# Patient Record
Sex: Female | Born: 1965 | Race: White | Hispanic: No | Marital: Married | Smoking: Never smoker
Health system: Southern US, Community
[De-identification: ages and names within clinical notes are randomized; demographics above are authoritative.]

## PROBLEM LIST (undated history)

## (undated) DIAGNOSIS — N390 Urinary tract infection, site not specified: Secondary | ICD-10-CM

## (undated) DIAGNOSIS — K649 Unspecified hemorrhoids: Secondary | ICD-10-CM

## (undated) DIAGNOSIS — N904 Leukoplakia of vulva: Secondary | ICD-10-CM

## (undated) DIAGNOSIS — U071 COVID-19: Secondary | ICD-10-CM

## (undated) DIAGNOSIS — R011 Cardiac murmur, unspecified: Secondary | ICD-10-CM

## (undated) DIAGNOSIS — T7840XA Allergy, unspecified, initial encounter: Secondary | ICD-10-CM

## (undated) DIAGNOSIS — D259 Leiomyoma of uterus, unspecified: Secondary | ICD-10-CM

## (undated) DIAGNOSIS — C9210 Chronic myeloid leukemia, BCR/ABL-positive, not having achieved remission: Secondary | ICD-10-CM

## (undated) DIAGNOSIS — D219 Benign neoplasm of connective and other soft tissue, unspecified: Secondary | ICD-10-CM

## (undated) DIAGNOSIS — E079 Disorder of thyroid, unspecified: Secondary | ICD-10-CM

## (undated) HISTORY — DX: Disorder of thyroid, unspecified: E07.9

## (undated) HISTORY — DX: Allergy, unspecified, initial encounter: T78.40XA

## (undated) HISTORY — DX: Urinary tract infection, site not specified: N39.0

## (undated) HISTORY — DX: COVID-19: U07.1

## (undated) HISTORY — PX: TYMPANOSTOMY TUBE PLACEMENT: SHX32

## (undated) HISTORY — DX: Benign neoplasm of connective and other soft tissue, unspecified: D21.9

## (undated) HISTORY — DX: Unspecified hemorrhoids: K64.9

## (undated) HISTORY — DX: Leukoplakia of vulva: N90.4

## (undated) HISTORY — DX: Cardiac murmur, unspecified: R01.1

## (undated) HISTORY — PX: CYSTECTOMY: SUR359

## (undated) HISTORY — DX: Chronic myeloid leukemia, BCR/ABL-positive, not having achieved remission: C92.10

## (undated) HISTORY — DX: Leiomyoma of uterus, unspecified: D25.9

## (undated) HISTORY — PX: TONSILECTOMY, ADENOIDECTOMY, BILATERAL MYRINGOTOMY AND TUBES: SHX2538

## (undated) HISTORY — PX: OTHER SURGICAL HISTORY: SHX169

---

## 2004-06-13 ENCOUNTER — Encounter: Payer: Self-pay | Admitting: Cardiovascular Disease

## 2004-08-28 ENCOUNTER — Encounter: Payer: Self-pay | Admitting: Cardiovascular Disease

## 2005-06-26 ENCOUNTER — Encounter: Payer: Self-pay | Admitting: Cardiovascular Disease

## 2005-10-11 ENCOUNTER — Encounter: Payer: Self-pay | Admitting: Cardiovascular Disease

## 2005-10-23 ENCOUNTER — Encounter: Payer: Self-pay | Admitting: Cardiovascular Disease

## 2006-08-06 ENCOUNTER — Encounter: Payer: Self-pay | Admitting: Cardiovascular Disease

## 2006-09-05 ENCOUNTER — Encounter: Payer: Self-pay | Admitting: Cardiovascular Disease

## 2006-09-09 ENCOUNTER — Encounter: Payer: Self-pay | Admitting: Cardiovascular Disease

## 2007-02-07 LAB — HM MAMMOGRAPHY

## 2007-06-08 ENCOUNTER — Encounter: Payer: Self-pay | Admitting: Cardiovascular Disease

## 2007-06-09 ENCOUNTER — Encounter: Payer: Self-pay | Admitting: Cardiovascular Disease

## 2007-06-22 ENCOUNTER — Encounter: Payer: Self-pay | Admitting: Cardiovascular Disease

## 2008-01-06 ENCOUNTER — Encounter: Payer: Self-pay | Admitting: Cardiovascular Disease

## 2008-02-17 ENCOUNTER — Encounter: Payer: Self-pay | Admitting: Cardiovascular Disease

## 2011-05-09 LAB — HM PAP SMEAR: HM PAP: NEGATIVE

## 2017-02-19 ENCOUNTER — Inpatient Hospital Stay: Payer: BLUE CROSS/BLUE SHIELD | Attending: Oncology | Admitting: Oncology

## 2017-02-19 ENCOUNTER — Encounter: Payer: Self-pay | Admitting: Oncology

## 2017-02-19 ENCOUNTER — Encounter (INDEPENDENT_AMBULATORY_CARE_PROVIDER_SITE_OTHER): Payer: Self-pay

## 2017-02-19 ENCOUNTER — Inpatient Hospital Stay: Payer: BLUE CROSS/BLUE SHIELD

## 2017-02-19 ENCOUNTER — Other Ambulatory Visit: Payer: Self-pay

## 2017-02-19 VITALS — BP 112/74 | HR 74 | Temp 96.6°F | Resp 16 | Ht 63.75 in | Wt 179.6 lb

## 2017-02-19 DIAGNOSIS — E039 Hypothyroidism, unspecified: Secondary | ICD-10-CM | POA: Diagnosis not present

## 2017-02-19 DIAGNOSIS — C9210 Chronic myeloid leukemia, BCR/ABL-positive, not having achieved remission: Secondary | ICD-10-CM

## 2017-02-19 DIAGNOSIS — Z880 Allergy status to penicillin: Secondary | ICD-10-CM | POA: Diagnosis not present

## 2017-02-19 DIAGNOSIS — R946 Abnormal results of thyroid function studies: Secondary | ICD-10-CM | POA: Insufficient documentation

## 2017-02-19 DIAGNOSIS — R300 Dysuria: Secondary | ICD-10-CM | POA: Diagnosis not present

## 2017-02-19 DIAGNOSIS — D500 Iron deficiency anemia secondary to blood loss (chronic): Secondary | ICD-10-CM

## 2017-02-19 DIAGNOSIS — N920 Excessive and frequent menstruation with regular cycle: Secondary | ICD-10-CM | POA: Diagnosis not present

## 2017-02-19 DIAGNOSIS — Z808 Family history of malignant neoplasm of other organs or systems: Secondary | ICD-10-CM

## 2017-02-19 DIAGNOSIS — C9211 Chronic myeloid leukemia, BCR/ABL-positive, in remission: Secondary | ICD-10-CM | POA: Insufficient documentation

## 2017-02-19 DIAGNOSIS — Z8052 Family history of malignant neoplasm of bladder: Secondary | ICD-10-CM | POA: Diagnosis not present

## 2017-02-19 DIAGNOSIS — D259 Leiomyoma of uterus, unspecified: Secondary | ICD-10-CM

## 2017-02-19 DIAGNOSIS — Z79899 Other long term (current) drug therapy: Secondary | ICD-10-CM | POA: Insufficient documentation

## 2017-02-19 LAB — URINALYSIS, COMPLETE (UACMP) WITH MICROSCOPIC
Bilirubin Urine: NEGATIVE
GLUCOSE, UA: NEGATIVE mg/dL
HGB URINE DIPSTICK: NEGATIVE
KETONES UR: NEGATIVE mg/dL
LEUKOCYTES UA: NEGATIVE
NITRITE: NEGATIVE
PH: 7 (ref 5.0–8.0)
Protein, ur: NEGATIVE mg/dL
Specific Gravity, Urine: 1.01 (ref 1.005–1.030)

## 2017-02-19 LAB — COMPREHENSIVE METABOLIC PANEL
ALT: 16 U/L (ref 14–54)
AST: 19 U/L (ref 15–41)
Albumin: 3.9 g/dL (ref 3.5–5.0)
Alkaline Phosphatase: 61 U/L (ref 38–126)
Anion gap: 6 (ref 5–15)
BUN: 12 mg/dL (ref 6–20)
CHLORIDE: 102 mmol/L (ref 101–111)
CO2: 27 mmol/L (ref 22–32)
CREATININE: 0.69 mg/dL (ref 0.44–1.00)
Calcium: 8.8 mg/dL — ABNORMAL LOW (ref 8.9–10.3)
GFR calc Af Amer: >60 mL/min (ref >60)
Glucose, Bld: 94 mg/dL (ref 65–99)
Potassium: 3.9 mmol/L (ref 3.5–5.1)
Sodium: 135 mmol/L (ref 135–145)
TOTAL PROTEIN: 7.8 g/dL (ref 6.5–8.1)
Total Bilirubin: 0.5 mg/dL (ref 0.3–1.2)

## 2017-02-19 LAB — CBC WITH DIFFERENTIAL/PLATELET
BASOS ABS: 0.1 10*3/uL (ref 0–0.1)
BASOS PCT: 1 %
EOS ABS: 0.2 10*3/uL (ref 0–0.7)
EOS PCT: 5 %
HCT: 35.3 % (ref 35.0–47.0)
HEMOGLOBIN: 11.9 g/dL — AB (ref 12.0–16.0)
Lymphocytes Relative: 32 %
Lymphs Abs: 1.3 10*3/uL (ref 1.0–3.6)
MCH: 29.8 pg (ref 26.0–34.0)
MCHC: 33.6 g/dL (ref 32.0–36.0)
MCV: 88.9 fL (ref 80.0–100.0)
Monocytes Absolute: 0.3 10*3/uL (ref 0.2–0.9)
Monocytes Relative: 8 %
Neutro Abs: 2.3 10*3/uL (ref 1.4–6.5)
Neutrophils Relative %: 54 %
PLATELETS: 229 10*3/uL (ref 150–440)
RBC: 3.97 MIL/uL (ref 3.80–5.20)
RDW: 15.9 % — ABNORMAL HIGH (ref 11.5–14.5)
WBC: 4.2 10*3/uL (ref 3.6–11.0)

## 2017-02-19 LAB — TSH: TSH: 4.665 u[IU]/mL — AB (ref 0.350–4.500)

## 2017-02-19 NOTE — Progress Notes (Signed)
Hematology/Oncology Consult note [facility] Telephone:([phone] Fax:([phone]   Patient Care Team: Patient, No Pcp Per as PCP - General (General Practice)  REFERRING PROVIDER: Previous oncologist from [place] Dr.Hasan Rizvi CHIEF COMPLAINTS/PURPOSE OF CONSULTATION:  Management of CML  HISTORY OF PRESENTING ILLNESS:  Jennifer English is a  51 y.o.  female with PMH listed below who was referred to by her oncologist in [place]. Patient relocated from [place] to [place] this summer. She has not had local pcp yet. Extensive medical records review was performed.  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 She had dyuria symptoms at that time and lab work up found extreme leukocytosis with WBC 173,500 and she was initially started on hydrea. After CML was confirmed, she was started on Tasigna 300mg  BID since then and has achieved remission. . Also had splenomegaly at presentation about 5-6 cm below LCM, resolved. . She reports being complaint on nilotinib however, she has had period of time when her medication runs out. Although she moved this summer, due to lot of stresses associated with relocation and new job, she waited until now see hematologist.  Denies any fever, chill, weight loss, bleeding events. She used to have EKG done every 3 months and faxed to oncologist. She has not had any EKG done after July this year. No PCP Also reports recently feeling urine has odor and also very mild burning sensation when passing urine. She requests to have urine tested.  Has hypothyroidism, not taking thyroid supplements.  History of iron deficiency due to heavy menstrual periods/uterine fibroids, taking iron supplements.  # 10/09/2012, bone marrow aspirate smears and clots: Markedly hypercellular marrow with prominent myeloid hyperplasia and increasing small hypolobated megakaryocytes. The overall findings are consistent with chronic myeloid leukemia, chronic phase. #  03/22/2015 BCR ABL quantification PCR showed e13a2 (b2a2 p210) transcript <0.001%,e14a2 ( b3a2, p210)  transcript 0.023%, e1a2 (p190) transcript <0.001% # 08/17/2015 BCR ABL quantification PCR showed e13a2 (b2a2 p210) transcript 0.011%,e14a2 ( b3a2, p210)  transcript <0.001%, e1a2 (p190) transcript <0.001%  # 04/25/2016 BCR ABL quantification PCR showed e13a2 (b2a2 p210) transcript <0.001%,e14a2 ( b3a2, p210)  transcript <0.001%, e1a2 (p190) transcript <0.001%  #09/17/2016  BCR ABL quantification PCR showed e13a2 (b2a2 p210) transcript <0.0032%,e14a2 ( b3a2, p210)  transcript <0.0032%, e1a2 (p190) transcript <0.0032%   Review of Systems  Constitutional: Negative for chills and fever.  HENT: Negative for hearing loss and tinnitus.   Eyes: Negative for blurred vision.  Respiratory: Negative for cough.   Cardiovascular: Negative for chest pain.  Gastrointestinal: Negative for heartburn.  Genitourinary: Positive for dysuria.  Musculoskeletal: Negative for myalgias.  Skin: Negative for rash.  Neurological: Negative for dizziness.  Endo/Heme/Allergies: Does not bruise/bleed easily.  Psychiatric/Behavioral: Negative for depression.    MEDICAL HISTORY:  Past Medical History:  Diagnosis Date  . Allergy   . Leukemia ([place])   . Thyroid disease   . Uterine fibroid     SURGICAL HISTORY: Past Surgical History:  Procedure Laterality Date  . CESAREAN SECTION    . CYSTECTOMY     middle finger right hand   . TONSILECTOMY, ADENOIDECTOMY, BILATERAL MYRINGOTOMY AND TUBES      SOCIAL HISTORY: Social History   Socioeconomic History  . Marital status: Married    Spouse name: Not on file  . Number of children: Not on file  . Years of education: Not on file  . Highest education level: Not on file  Social Needs  . Financial  resource strain: Not on file  . Food insecurity - worry: Not on file  . Food insecurity - inability: Not on file  . Transportation needs - medical: Not on file  .  Transportation needs - non-medical: Not on file  Occupational History  . Not on file  Tobacco Use  . Smoking status: Never Smoker  . Smokeless tobacco: Never Used  Substance and Sexual Activity  . Alcohol use: No    Frequency: Never  . Drug use: No  . Sexual activity: Yes    Birth control/protection: None  Other Topics Concern  . Not on file  Social History Narrative  . Not on file    FAMILY HISTORY: Family History  Problem Relation Age of Onset  . Dementia Maternal Grandfather   . Melanoma Paternal Grandmother   . Bladder Cancer Paternal Grandmother   . Bladder Cancer Paternal Grandfather     ALLERGIES:  is allergic to aspirin; avocado; eggs or egg-derived products; and penicillins.  MEDICATIONS:  Current Outpatient Medications  Medication Sig Dispense Refill  . Ascorbic Acid (VITAMIN C) 100 MG tablet Take 100 mg by mouth daily.    . ferrous sulfate 325 (65 FE) MG tablet Take 325 mg by mouth daily with breakfast.    . magnesium citrate SOLN Take 1 Bottle by mouth once.    Marland Kitchen TASIGNA 150 MG capsule Take 150 mg by mouth 2 (two) times daily.     No current facility-administered medications for this visit.      PHYSICAL EXAMINATION: ECOG PERFORMANCE STATUS: 0 - Asymptomatic Vitals:   02/19/17 1019  BP: 112/74  Pulse: 74  Resp: 16  Temp: (!) 96.6 F (35.9 C)   Filed Weights   02/19/17 1019  Weight: 179 lb 9 oz (81.4 kg)    GENERAL: No distress, well nourished.  SKIN:  No rashes or significant lesions  HEAD: Normocephalic, No masses, lesions, tenderness or abnormalities  EYES: Conjunctiva are pink, non icteric ENT: External ears normal ,lips , buccal mucosa, and tongue normal and mucous membranes are moist  LYMPH: No palpable cervical and axillary lymphadenopathy  LUNGS: Clear to auscultation, no crackles or wheezes HEART: Regular rate & rhythm, no murmurs, no gallops, S1 normal and S2 normal  ABDOMEN: Abdomen soft, non-tender, normal bowel sounds, I did not  appreciate any  masses or organomegaly  MUSCULOSKELETAL: No CVA tenderness and no tenderness on percussion of the back or rib cage.  EXTREMITIES: No edema, no skin discoloration or tenderness NEURO: Alert & oriented, no focal motor/sensory deficits.    LABORATORY DATA:  I have reviewed the data as listed Lab Results  Component Value Date   WBC 4.2 02/19/2017   HGB 11.9 (L) 02/19/2017   HCT 35.3 02/19/2017   MCV 88.9 02/19/2017   PLT 229 02/19/2017   Recent Labs    02/19/17 1123  NA 135  K 3.9  CL 102  CO2 27  GLUCOSE 94  BUN 12  CREATININE 0.69  CALCIUM 8.8*  GFRNONAA >60  GFRAA >60  PROT 7.8  ALBUMIN 3.9  AST 19  ALT 16  ALKPHOS 61  BILITOT 0.5       ASSESSMENT & PLAN:  1. CML (chronic myelocytic leukemia) ([place])   2. Dysuria   3. Hypothyroidism, unspecified type    Check BCR-ABL1 Quantification PCR. Continue Tasigna 300mg  BID Refer to primary care to establish care and check EKGs every 3 months.. Check UA, TSH, cbc, cmp. UA showed negative nitrate and leukocytes. TSH mildly  elevated. Results were communicated with patient and she has appointment in early December to establish care for her chronic medical issue. ,   All questions were answered. The patient knows to call the clinic with any problems questions or concerns. Return of visit: 4 weeks.     [name], MD, PhD Hematology Oncology [facility] at [facility] Pager- [phone] 02/19/2017

## 2017-02-19 NOTE — Progress Notes (Signed)
Patient here today as a new patient with leukemia.  Patient c/o intermittent urine odor and lower abdominal discomfort

## 2017-02-20 ENCOUNTER — Encounter: Payer: Self-pay | Admitting: Oncology

## 2017-02-20 ENCOUNTER — Telehealth: Payer: Self-pay | Admitting: Pharmacist

## 2017-02-20 DIAGNOSIS — E039 Hypothyroidism, unspecified: Secondary | ICD-10-CM | POA: Insufficient documentation

## 2017-02-20 DIAGNOSIS — C9210 Chronic myeloid leukemia, BCR/ABL-positive, not having achieved remission: Secondary | ICD-10-CM | POA: Insufficient documentation

## 2017-02-20 MED ORDER — TASIGNA 150 MG PO CAPS: 300.0000 mg | ORAL_CAPSULE | Freq: Two times a day (BID) | ORAL | 2 refills | Status: DC

## 2017-02-20 MED ORDER — NILOTINIB HCL 150 MG PO CAPS: 300.0000 mg | ORAL_CAPSULE | Freq: Two times a day (BID) | ORAL | 2 refills | Status: DC

## 2017-02-20 NOTE — Progress Notes (Signed)
START OFF PATHWAY REGIMEN - CML   [id number]:Nilotinib 300 mg BID:   Administer twice daily.:     Nilotinib   **Always confirm dose/schedule in your pharmacy ordering system**    Patient Characteristics: BCR-ABL Negative BCR-ABL Status: Negative Intent of Therapy: Curative Intent, Discussed with Patient

## 2017-02-20 NOTE — Addendum Note (Signed)
Addended by: [name] on: 02/20/2017 10:17 AM   Modules accepted: Orders

## 2017-02-20 NOTE — Telephone Encounter (Signed)
Oral Chemotherapy Pharmacist Encounter  Due to insurance restriction, medication can not be filled at [facility]. Must be filled at [facility]. E-scribing Rx to them.   [name], PharmD, BCPS Hematology/Oncology Clinical Pharmacist [facility] [phone]  02/20/2017 5:37 PM

## 2017-02-20 NOTE — Telephone Encounter (Signed)
Oral Oncology Pharmacist Encounter  Received new prescription for Tasigna (nilotinib) for the treatment of CML, planned duration until disease progression or unacceptable drug toxicity. She has been on Tasigna since 09/2012. She has recently moved and transferring her care to [facility].   CMP/CBC from 02/19/17 assessed, no relevant lab abnormalities. Prescription dose and frequency assessed.   Current medication list in Epic reviewed, no DDIs with Tasigna identified.   Prescription has been e-scribed to the [facility] for benefits analysis and approval.  Oral Oncology Clinic will continue to follow for insurance authorization, copayment issues, initial counseling and start date.  [name], PharmD, BCPS Hematology/Oncology Clinical Pharmacist [facility] [phone]  02/20/2017 3:53 PM

## 2017-02-21 NOTE — Telephone Encounter (Signed)
Oral Chemotherapy Pharmacist Encounter  Called patient to inform her that her prescription was sent to [facility]. She has used them in the past.  With her new insurance she is aware that we will most likely need to complete a PA before the medication can be filled.  [name], PharmD, BCPS Hematology/Oncology Clinical Pharmacist [facility] [phone]  02/21/2017 9:05 AM

## 2017-02-24 ENCOUNTER — Ambulatory Visit: Payer: BLUE CROSS/BLUE SHIELD | Admitting: Internal Medicine

## 2017-02-24 LAB — BCR-ABL1, CML/ALL, PCR, QUANT

## 2017-02-26 ENCOUNTER — Telehealth: Payer: Self-pay | Admitting: Internal Medicine

## 2017-02-26 NOTE — Telephone Encounter (Signed)
Were you able to speak to her and get this taken care of today?

## 2017-02-26 NOTE — Telephone Encounter (Signed)
Oral Oncology Patient Advocate Encounter  Corky Sing been trying to get in touch with patient to have her call [facility] to get her Tasigna shipped. Have called fro several days and left messages for her to please call me.    [place] Patient Advocate [phone] 02/26/2017 9:22 AM

## 2017-02-27 ENCOUNTER — Telehealth: Payer: Self-pay | Admitting: Pharmacist

## 2017-02-27 NOTE — Telephone Encounter (Signed)
Oral Chemotherapy Pharmacist Encounter  Ms. Ezzell did not show up to her ECG. This is needed to monitor her while she is on Tasigna. Called [facility] to cancel further refills her her Tasigna.  If she does complete her ECG, she will need a new prescription sent into [facility] at that time.   [name], PharmD, BCPS Hematology/Oncology Clinical Pharmacist [facility] [phone]  02/27/2017 4:25 PM

## 2017-02-27 NOTE — Telephone Encounter (Signed)
Oral Oncology Patient Advocate Encounter  Called [facility] and they are shipping her Tasigna today.   [facility] Patient Advocate [phone] 02/27/2017 11:31 AM

## 2017-03-07 NOTE — Telephone Encounter (Signed)
Her chart shows that she has been rescheduled for 04/03/17.

## 2017-03-07 NOTE — Telephone Encounter (Signed)
Jennifer English, patient did not show up at PCP, do you know if she was rescheduled?  thanks

## 2017-03-18 NOTE — Progress Notes (Deleted)
Hematology/Oncology Consult note [facility] [phone] Fax:(336) [phone]   Patient Care Team: McLean-Scocuzza, Nino Glow, MD as PCP - General (Internal Medicine)  REFERRING PROVIDER: Previous oncologist from [place] Dr.Hasan Rizvi CHIEF COMPLAINTS/PURPOSE OF CONSULTATION:  Management of CML  HISTORY OF PRESENTING ILLNESS:  Jennifer English is a  51 y.o.  female with PMH listed below who was referred to by her oncologist in [place]. Patient relocated from [place] to [place] this summer. She has not had local pcp yet. Extensive medical records review was performed.  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 She had dyuria symptoms at that time and lab work up found extreme leukocytosis with WBC 173,500 and she was initially started on hydrea. After CML was confirmed, she was started on Tasigna 300mg  BID since then and has achieved remission. . Also had splenomegaly at presentation about 5-6 cm below LCM, resolved. . She reports being complaint on nilotinib however, she has had period of time when her medication runs out. Although she moved this summer, due to lot of stresses associated with relocation and new job, she waited until now see hematologist.  Denies any fever, chill, weight loss, bleeding events. She used to have EKG done every 3 months and faxed to oncologist. She has not had any EKG done after July this year. No PCP Also reports recently feeling urine has odor and also very mild burning sensation when passing urine. She requests to have urine tested.  Has hypothyroidism, not taking thyroid supplements.  History of iron deficiency due to heavy menstrual periods/uterine fibroids, taking iron supplements.  # 10/09/2012, bone marrow aspirate smears and clots: Markedly hypercellular marrow with prominent myeloid hyperplasia and increasing small hypolobated megakaryocytes. The overall findings are consistent with chronic myeloid leukemia, chronic phase. #  03/22/2015 BCR ABL quantification PCR showed e13a2 (b2a2 p210) transcript <0.001%,e14a2 ( b3a2, p210)  transcript 0.023%, e1a2 (p190) transcript <0.001% # 08/17/2015 BCR ABL quantification PCR showed e13a2 (b2a2 p210) transcript 0.011%,e14a2 ( b3a2, p210)  transcript <0.001%, e1a2 (p190) transcript <0.001%  # 04/25/2016 BCR ABL quantification PCR showed e13a2 (b2a2 p210) transcript <0.001%,e14a2 ( b3a2, p210)  transcript <0.001%, e1a2 (p190) transcript <0.001%  #09/17/2016  BCR ABL quantification PCR showed e13a2 (b2a2 p210) transcript <0.0032%,e14a2 ( b3a2, p210)  transcript <0.0032%, e1a2 (p190) transcript <0.0032%   Review of Systems  Constitutional: Negative for chills and fever.  HENT: Negative for hearing loss and tinnitus.   Eyes: Negative for blurred vision.  Respiratory: Negative for cough.   Cardiovascular: Negative for chest pain.  Gastrointestinal: Negative for heartburn.  Genitourinary: Positive for dysuria.  Musculoskeletal: Negative for myalgias.  Skin: Negative for rash.  Neurological: Negative for dizziness.  Endo/Heme/Allergies: Does not bruise/bleed easily.  Psychiatric/Behavioral: Negative for depression.    MEDICAL HISTORY:  Past Medical History:  Diagnosis Date  . Allergy   . Uterine fibroid     SURGICAL HISTORY: Past Surgical History:  Procedure Laterality Date  . CESAREAN SECTION    . CYSTECTOMY     middle finger right hand   . TONSILECTOMY, ADENOIDECTOMY, BILATERAL MYRINGOTOMY AND TUBES      SOCIAL HISTORY: Social History   Socioeconomic History  . Marital status: Married    Spouse name: Not on file  . Number of children: Not on file  . Years of education: Not on file  . Highest education level: Not on file  Social Needs  . Financial resource strain: Not on file  . Food insecurity -  worry: Not on file  . Food insecurity - inability: Not on file  . Transportation needs - medical: Not on file  . Transportation needs - non-medical: Not on file    Occupational History  . Not on file  Tobacco Use  . Smoking status: Never Smoker  . Smokeless tobacco: Never Used  Substance and Sexual Activity  . Alcohol use: No    Frequency: Never  . Drug use: No  . Sexual activity: Yes    Birth control/protection: None  Other Topics Concern  . Not on file  Social History Narrative  . Not on file    FAMILY HISTORY: Family History  Problem Relation Age of Onset  . Dementia Maternal Grandfather   . Melanoma Paternal Grandmother   . Bladder Cancer Paternal Grandmother   . Bladder Cancer Paternal Grandfather     ALLERGIES:  is allergic to aspirin; avocado; eggs or egg-derived products; and penicillins.  MEDICATIONS:  Current Outpatient Medications  Medication Sig Dispense Refill  . Ascorbic Acid (VITAMIN C) 100 MG tablet Take 100 mg by mouth daily.    . ferrous sulfate 325 (65 FE) MG tablet Take 325 mg by mouth daily with breakfast.    . magnesium citrate SOLN Take 1 Bottle by mouth once.    . nilotinib (TASIGNA) 150 MG capsule Take 2 capsules (300 mg total) by mouth 2 (two) times daily. Take on an empty stomach, 1 hour before or 2 hours after food. 120 capsule 2   No current facility-administered medications for this visit.      PHYSICAL EXAMINATION: ECOG PERFORMANCE STATUS: 0 - Asymptomatic There were no vitals filed for this visit. There were no vitals filed for this visit.  GENERAL: No distress, well nourished.  SKIN:  No rashes or significant lesions  HEAD: Normocephalic, No masses, lesions, tenderness or abnormalities  EYES: Conjunctiva are pink, non icteric ENT: External ears normal ,lips , buccal mucosa, and tongue normal and mucous membranes are moist  LYMPH: No palpable cervical and axillary lymphadenopathy  LUNGS: Clear to auscultation, no crackles or wheezes HEART: Regular rate & rhythm, no murmurs, no gallops, S1 normal and S2 normal  ABDOMEN: Abdomen soft, non-tender, normal bowel sounds, I did not appreciate any   masses or organomegaly  MUSCULOSKELETAL: No CVA tenderness and no tenderness on percussion of the back or rib cage.  EXTREMITIES: No edema, no skin discoloration or tenderness NEURO: Alert & oriented, no focal motor/sensory deficits.    LABORATORY DATA:  I have reviewed the data as listed Lab Results  Component Value Date   WBC 4.2 02/19/2017   HGB 11.9 (L) 02/19/2017   HCT 35.3 02/19/2017   MCV 88.9 02/19/2017   PLT 229 02/19/2017   Recent Labs    02/19/17 1123  NA 135  K 3.9  CL 102  CO2 27  GLUCOSE 94  BUN 12  CREATININE 0.69  CALCIUM 8.8*  GFRNONAA >60  GFRAA >60  PROT 7.8  ALBUMIN 3.9  AST 19  ALT 16  ALKPHOS 61  BILITOT 0.5       ASSESSMENT & PLAN:  No diagnosis found. Check BCR-ABL1 Quantification PCR. Continue Tasigna 300mg  BID Refer to primary care to establish care and check EKGs every 3 months.. Check UA, TSH, cbc, cmp. UA showed negative nitrate and leukocytes. TSH mildly elevated. Results were communicated with patient and she has appointment in early December to establish care for her chronic medical issue. ,   All questions were answered. The patient  knows to call the clinic with any problems questions or concerns. Return of visit: 4 weeks.     [name], MD, PhD Hematology Oncology [facility] at [facility] Pager- [phone] 03/18/2017

## 2017-03-19 ENCOUNTER — Inpatient Hospital Stay: Payer: BLUE CROSS/BLUE SHIELD | Admitting: Oncology

## 2017-03-19 ENCOUNTER — Inpatient Hospital Stay: Payer: BLUE CROSS/BLUE SHIELD | Attending: Oncology

## 2017-04-03 ENCOUNTER — Encounter: Payer: Self-pay | Admitting: Internal Medicine

## 2017-04-03 ENCOUNTER — Ambulatory Visit: Payer: BLUE CROSS/BLUE SHIELD | Admitting: Internal Medicine

## 2017-04-03 VITALS — BP 118/72 | HR 74 | Temp 98.1°F | Resp 16 | Ht 64.0 in | Wt 181.5 lb

## 2017-04-03 DIAGNOSIS — Z5181 Encounter for therapeutic drug level monitoring: Secondary | ICD-10-CM | POA: Diagnosis not present

## 2017-04-03 DIAGNOSIS — E039 Hypothyroidism, unspecified: Secondary | ICD-10-CM | POA: Diagnosis not present

## 2017-04-03 DIAGNOSIS — R5383 Other fatigue: Secondary | ICD-10-CM

## 2017-04-03 DIAGNOSIS — J069 Acute upper respiratory infection, unspecified: Secondary | ICD-10-CM | POA: Diagnosis not present

## 2017-04-03 DIAGNOSIS — R011 Cardiac murmur, unspecified: Secondary | ICD-10-CM | POA: Diagnosis not present

## 2017-04-03 DIAGNOSIS — C9210 Chronic myeloid leukemia, BCR/ABL-positive, not having achieved remission: Secondary | ICD-10-CM

## 2017-04-03 DIAGNOSIS — R946 Abnormal results of thyroid function studies: Secondary | ICD-10-CM | POA: Diagnosis not present

## 2017-04-03 DIAGNOSIS — H547 Unspecified visual loss: Secondary | ICD-10-CM | POA: Diagnosis not present

## 2017-04-03 LAB — T3, FREE: T3 FREE: 3 pg/mL (ref 2.3–4.2)

## 2017-04-03 LAB — T4, FREE: FREE T4: 0.78 ng/dL (ref 0.60–1.60)

## 2017-04-03 LAB — TSH: TSH: 5.43 u[IU]/mL — ABNORMAL HIGH (ref 0.35–4.50)

## 2017-04-03 NOTE — Patient Instructions (Signed)
Please follow up in 3 months sooner if cough does not resolve  Take care  I referred you to [facility] for eye exam    Cough, Adult Coughing is a reflex that clears your throat and your airways. Coughing helps to heal and protect your lungs. It is normal to cough occasionally, but a cough that happens with other symptoms or lasts a long time may be a sign of a condition that needs treatment. A cough may last only 2-3 weeks (acute), or it may last longer than 8 weeks (chronic). What are the causes? Coughing is commonly caused by:  Breathing in substances that irritate your lungs.  A viral or bacterial respiratory infection.  Allergies.  Asthma.  Postnasal drip.  Smoking.  Acid backing up from the stomach into the esophagus (gastroesophageal reflux).  Certain medicines.  Chronic lung problems, including COPD (or rarely, lung cancer).  Other medical conditions such as heart failure.  Follow these instructions at home: Pay attention to any changes in your symptoms. Take these actions to help with your discomfort:  Take medicines only as told by your health care provider. ? If you were prescribed an antibiotic medicine, take it as told by your health care provider. Do not stop taking the antibiotic even if you start to feel better. ? Talk with your health care provider before you take a cough suppressant medicine.  Drink enough fluid to keep your urine clear or pale yellow.  If the air is dry, use a cold steam vaporizer or humidifier in your bedroom or your home to help loosen secretions.  Avoid anything that causes you to cough at work or at home.  If your cough is worse at night, try sleeping in a semi-upright position.  Avoid cigarette smoke. If you smoke, quit smoking. If you need help quitting, ask your health care provider.  Avoid caffeine.  Avoid alcohol.  Rest as needed.  Contact a health care provider if:  You have new symptoms.  You cough up  pus.  Your cough does not get better after 2-3 weeks, or your cough gets worse.  You cannot control your cough with suppressant medicines and you are losing sleep.  You develop pain that is getting worse or pain that is not controlled with pain medicines.  You have a fever.  You have unexplained weight loss.  You have night sweats. Get help right away if:  You cough up blood.  You have difficulty breathing.  Your heartbeat is very fast. This information is not intended to replace advice given to you by your health care provider. Make sure you discuss any questions you have with your health care provider. Document Released: 09/07/2010 Document Revised: 08/17/2015 Document Reviewed: 05/18/2014 Elsevier Interactive Patient Education  [employer].

## 2017-04-06 ENCOUNTER — Encounter: Payer: Self-pay | Admitting: Internal Medicine

## 2017-04-06 DIAGNOSIS — R5383 Other fatigue: Secondary | ICD-10-CM | POA: Insufficient documentation

## 2017-04-06 NOTE — Progress Notes (Signed)
Chief Complaint  Patient presents with  . Establish Care   Establish  1. H/o CML moved from [place] follows with Dr. Miachel Roux in [place] on Tasigna 300 bid and needs EKG q3 months. She has been dx'ed since 09/2012 and on this medication since w/o side effects one side effect to caution is prolonged QT>60 msec and due to this she has to limit food 2 hours before medication and no food 1 hour after medication per pt.  She will f/u Dr. Miachel Roux as well.  2. She reports trouble seeing and prev seen eye MD in [place] and rec she wear readers. She reports trouble with focusing on objects and she has not seen eye MD since 2017 3. She c/o dry cough since 03/18/17 she has tried robitussin DM and getting better she is feeling ok and just c/w hoarseness with cough. She denies h/o asthma 4. 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 Negative for fever.  HENT: Negative for hearing loss.        +hoarseness   Eyes:       +trouble with focus   Respiratory: Positive for cough. Negative for shortness of breath.   Cardiovascular: Negative for chest pain.  Gastrointestinal: Negative for abdominal pain.  Musculoskeletal: Negative for myalgias.  Skin: Negative for rash.  Neurological: Negative for headaches.  Psychiatric/Behavioral: Negative for memory loss.   Past Medical History:  Diagnosis Date  . Allergy   . CML (chronic myelocytic leukemia) ([place])   . Heart murmur   . Uterine fibroid    Past Surgical History:  Procedure Laterality Date  . CESAREAN SECTION     x 1   . CYSTECTOMY     middle finger right hand   . OTHER SURGICAL HISTORY     cyst removed from finger  . TONSILECTOMY, ADENOIDECTOMY, BILATERAL MYRINGOTOMY AND TUBES     Family History  Problem Relation Age of Onset  . Heart disease Father        CAD with stents   . Hyperlipidemia Father   . Hypertension Father   . Dementia Maternal  Grandfather   . Melanoma Paternal Grandmother   . Bladder Cancer Paternal Grandmother   . Cancer Paternal Grandmother        lung/colon ?primary   . Bladder Cancer Paternal Grandfather   . Cancer Paternal Grandfather        bladder died in 46s   . Alcohol abuse Maternal Grandmother   . Hepatitis Maternal Grandmother    Social History   Socioeconomic History  . Marital status: Married    Spouse name: Not on file  . Number of children: Not on file  . Years of education: Not on file  . Highest education level: Not on file  Social Needs  . Financial resource strain: Not on file  . Food insecurity - worry: Not on file  . Food insecurity - inability: Not on file  . Transportation needs - medical: Not on file  . Transportation needs - non-medical: Not on file  Occupational History  . Not on file  Tobacco Use  . Smoking status: Never Smoker  . Smokeless tobacco: Never Used  Substance and Sexual Activity  . Alcohol use: No    Frequency: Never  . Drug use: No  . Sexual activity: Yes    Birth control/protection: None  Other Topics Concern  . Not  on file  Social History Narrative  . Not on file   Current Meds  Medication Sig  . Ascorbic Acid (VITAMIN C) 100 MG tablet Take 100 mg by mouth daily.  . ferrous sulfate 325 (65 FE) MG tablet Take 325 mg by mouth daily with breakfast.  . magnesium citrate SOLN Take 1 Bottle by mouth once.  . nilotinib (TASIGNA) 150 MG capsule Take 2 capsules (300 mg total) by mouth 2 (two) times daily. Take on an empty stomach, 1 hour before or 2 hours after food.   Allergies  Allergen Reactions  . Aspirin Rash  . Avocado Rash  . Eggs Or Egg-Derived Products Rash  . Penicillins Rash   Recent Results (from the past 2160 hour(s))  TSH     Status: Abnormal   Collection Time: 02/19/17 11:23 AM  Result Value Ref Range   TSH 4.665 (H) 0.350 - 4.500 uIU/mL    Comment: Performed by a 3rd Generation assay with a functional sensitivity of <=0.01 uIU/mL.   BCR-ABL1, CML/ALL, PCR, QUANT     Status: None   Collection Time: 02/19/17 11:23 AM  Result Value Ref Range   b2a2 transcript Comment %    Comment: (NOTE)           <0.0032 % (sensitivity limit of assay)    b3a2 transcript Comment %    Comment: (NOTE)           <0.0032 % (sensitivity limit of assay)    E1A2 Transcript Comment %    Comment: (NOTE)           <0.0032 % (sensitivity limit of assay)    Interpretation (BCRAL): Comment     Comment: (NOTE) NEGATIVE for the BCR-ABL1 e1a2 (p190), e13a2 (b2a2, p210) and e14a2 (b3a2, p210) fusion transcripts. These results do not rule out the presence of rare BCR-ABL1 transcripts not detected by this assay.    Director Review [facility]): Comment     Comment: (NOTE) Constance Goltz, PhD, [facility]               Director, [place] [facility] [place] [facility] [place], [place]    Background: Comment     Comment: (NOTE) This assay can detect three different types of BCR-ABL1 fusion transcripts associated with CML, ALL, and AML: e13a2 (previously b2a2) and e14a2 (previously b3a2) (major breakpoint, p210), as well as e1a2 (minor breakpoint, p190). The e13a2 and e14a2 transcript values are titrated to the current International Scale (IS). The standardized baseline is 100% BCR-ABL1 (IS) and major molecular response (MMR) is equivalent to 0.1% BCR-ABL1 (IS) corresponding to a 3-log reduction. Results should be correlated with appropriate clinical and laboratory information as indicated.    Methodology Comment     Comment: (NOTE) Total RNA is isolated from the sample and subject to a real-time, reverse transcriptase polymerase chain reaction (RT-PCR). The PCR primers and probes are specific for BCR-ABL1 e13a2, e14a2 and e1a2 fusion transcripts. The ABL1 transcript is amplified as the control for cDNA quantity and quality. Serial dilutions  of a validated positive control RNA with known t(9;22) BCR-ABL1 are used as reference for quantification of BCR-ABL1 relative to  ABL1. The numeric BCR-ABL1 level is reportd as % BCR-ABL1/ABL1 and the detection sensitivity is 4.5 log below the standard baseline. This test was developed and its performance characteristics determined by [facility]. It has not been cleared or approved by the Food and Drug Administration. References:    1. Anastasia Fiedler and Branford S: Seminars in Hematology 2003;       40 (suppl2):62-68.    2. White HE, et al. Blood 2010; 116: e111-117.    3. NCCN Clinical Practice Guidelines in Oncology, Chronic       Myeloid Leukemia. V2. 2017.  Performed At: [facility] [address] [place], [place] [id number] Nechama Guard MD XQ:[id number] Performed At: [place], [place] [id number] Nechama Guard MD [id number]   Comprehensive metabolic panel     Status: Abnormal   Collection Time: 02/19/17 11:23 AM  Result Value Ref Range   Sodium 135 135 - 145 mmol/L   Potassium 3.9 3.5 - 5.1 mmol/L   Chloride 102 101 - 111 mmol/L   CO2 27 22 - 32 mmol/L   Glucose, Bld 94 65 - 99 mg/dL   BUN 12 6 - 20 mg/dL   Creatinine, Ser 0.69 0.44 - 1.00 mg/dL   Calcium 8.8 (L) 8.9 - 10.3 mg/dL   Total Protein 7.8 6.5 - 8.1 g/dL   Albumin 3.9 3.5 - 5.0 g/dL   AST 19 15 - 41 U/L   ALT 16 14 - 54 U/L   Alkaline Phosphatase 61 38 - 126 U/L   Total Bilirubin 0.5 0.3 - 1.2 mg/dL   GFR calc non Af Amer >60 >60 mL/min   GFR calc Af Amer >60 >60 mL/min    Comment: (NOTE) The eGFR has been calculated using the CKD EPI equation. This calculation has not been validated in all clinical situations. eGFR's persistently <60 mL/min signify possible Chronic Kidney Disease.    Anion gap 6 5 - 15  CBC with Differential/Platelet     Status: Abnormal   Collection Time: 02/19/17 11:23 AM  Result Value Ref Range   WBC 4.2 3.6 - 11.0 K/uL   RBC 3.97 3.80  - 5.20 MIL/uL   Hemoglobin 11.9 (L) 12.0 - 16.0 g/dL   HCT 35.3 35.0 - 47.0 %   MCV 88.9 80.0 - 100.0 fL   MCH 29.8 26.0 - 34.0 pg   MCHC 33.6 32.0 - 36.0 g/dL   RDW 15.9 (H) 11.5 - 14.5 %   Platelets 229 150 - 440 K/uL   Neutrophils Relative % 54 %   Neutro Abs 2.3 1.4 - 6.5 K/uL   Lymphocytes Relative 32 %   Lymphs Abs 1.3 1.0 - 3.6 K/uL   Monocytes Relative 8 %   Monocytes Absolute 0.3 0.2 - 0.9 K/uL   Eosinophils Relative 5 %   Eosinophils Absolute 0.2 0 - 0.7 K/uL   Basophils Relative 1 %   Basophils Absolute 0.1 0 - 0.1 K/uL  Urinalysis, Complete w Microscopic     Status: Abnormal   Collection Time: 02/19/17 11:24 AM  Result Value Ref Range   Color, Urine YELLOW (A) YELLOW   APPearance CLEAR (A) CLEAR   Specific Gravity, Urine 1.010 1.005 - 1.030   pH 7.0 5.0 - 8.0   Glucose, UA NEGATIVE NEGATIVE mg/dL   Hgb urine dipstick NEGATIVE NEGATIVE   Bilirubin Urine NEGATIVE NEGATIVE   Ketones, ur NEGATIVE NEGATIVE mg/dL   Protein, ur NEGATIVE NEGATIVE mg/dL   Nitrite  NEGATIVE NEGATIVE   Leukocytes, UA NEGATIVE NEGATIVE   RBC / HPF 0-5 0 - 5 RBC/hpf   WBC, UA 0-5 0 - 5 WBC/hpf   Bacteria, UA MANY (A) NONE SEEN   Squamous Epithelial / LPF 0-5 (A) NONE SEEN   Mucus PRESENT   T4, free     Status: None   Collection Time: 04/03/17  2:04 PM  Result Value Ref Range   Free T4 0.78 0.60 - 1.60 ng/dL    Comment: Specimens from patients who are undergoing biotin therapy and /or ingesting biotin supplements may contain high levels of biotin.  The higher biotin concentration in these specimens interferes with this Free T4 assay.  Specimens that contain high levels  of biotin may cause false high results for this Free T4 assay.  Please interpret results in light of the total clinical presentation of the patient.    TSH     Status: Abnormal   Collection Time: 04/03/17  2:04 PM  Result Value Ref Range   TSH 5.43 (H) 0.35 - 4.50 uIU/mL  T3, free     Status: None   Collection Time:  04/03/17  2:04 PM  Result Value Ref Range   T3, Free 3.0 2.3 - 4.2 pg/mL   Objective  Body mass index is 31.15 kg/m. Wt Readings from Last 3 Encounters:  04/03/17 181 lb 8 oz (82.3 kg)  02/19/17 179 lb 9 oz (81.4 kg)   Temp Readings from Last 3 Encounters:  04/03/17 98.1 F (36.7 C) (Oral)  02/19/17 (!) 96.6 F (35.9 C) (Tympanic)   BP Readings from Last 3 Encounters:  04/03/17 118/72  02/19/17 112/74   Pulse Readings from Last 3 Encounters:  04/03/17 74  02/19/17 74   O2 sat room air 99%  Physical Exam  Constitutional: She is oriented to person, place, and time and well-developed, well-nourished, and in no distress. Vital signs are normal.  HENT:  Head: Normocephalic and atraumatic.  Mouth/Throat: Oropharynx is clear and moist and mucous membranes are normal.  Eyes: Conjunctivae are normal. Pupils are equal, round, and reactive to light.  Cardiovascular: Normal rate and regular rhythm.  Murmur heard. Pulmonary/Chest: Effort normal and breath sounds normal. She has no wheezes.  Abdominal: Soft. Bowel sounds are normal.  Neurological: She is alert and oriented to person, place, and time. Gait normal. Gait normal.  Skin: Skin is warm, dry and intact.  Psychiatric: Mood, memory, affect and judgment normal.  Nursing note and vitals reviewed.   Assessment   1. CML  2. Vision problem trouble with focus  3. Dry cough and URI since 03/18/17 w/o h/o asthma  4. Fatigue  5. Abnormal tfts in the past  6. HM  7. Cardiac murmur  Plan  1.  Cont med Tasigna 300 mg bid  F/u with Dr. Miachel Roux EKG today normal will repeat in 3 months. Monitor QT interval as medication may cause elevated QT>60 msec   2. Referred  eye  3.  otc supportive care  4. Monitor 02/19/17 CMET wnl, CBC with hbg 11.9, elevated TSH, UA with some bacteria  5. Complete tfts today  Consider anti TPo abx  6. Declines flu shot  Pt declines vaccines she does not normally take vaccines   Per pt last  pap 2014/2015 no h/o abnormal  Consider colonoscopy age 40  Mammogram last had 2014 will need in future need to get copy of old mammo  Consider check lipid (per pt had end 2017) , hep B  status in future  Last had skin check 2 years ago   Try to get [place] records from Dr. Miachel Roux 7. Has had echo in the past in [place] consider referral for repeat echo in future and cardiology   Provider: Dr. Olivia Mackie McLean-Scocuzza-Internal Medicine

## 2017-04-07 ENCOUNTER — Other Ambulatory Visit: Payer: Self-pay | Admitting: Internal Medicine

## 2017-04-07 DIAGNOSIS — R946 Abnormal results of thyroid function studies: Secondary | ICD-10-CM

## 2017-04-10 ENCOUNTER — Other Ambulatory Visit: Payer: BLUE CROSS/BLUE SHIELD

## 2017-04-16 ENCOUNTER — Other Ambulatory Visit: Payer: BLUE CROSS/BLUE SHIELD

## 2017-04-17 ENCOUNTER — Other Ambulatory Visit: Payer: BLUE CROSS/BLUE SHIELD

## 2017-04-17 DIAGNOSIS — R946 Abnormal results of thyroid function studies: Secondary | ICD-10-CM | POA: Diagnosis not present

## 2017-04-18 LAB — THYROID PEROXIDASE ANTIBODY

## 2017-04-24 ENCOUNTER — Other Ambulatory Visit: Payer: Self-pay | Admitting: Internal Medicine

## 2017-04-24 DIAGNOSIS — E038 Other specified hypothyroidism: Secondary | ICD-10-CM

## 2017-04-24 DIAGNOSIS — R7989 Other specified abnormal findings of blood chemistry: Secondary | ICD-10-CM

## 2017-04-24 DIAGNOSIS — E039 Hypothyroidism, unspecified: Secondary | ICD-10-CM

## 2017-05-08 ENCOUNTER — Other Ambulatory Visit: Payer: Self-pay | Admitting: Internal Medicine

## 2017-05-08 NOTE — Progress Notes (Signed)
Reviewed [place] records prior PCP Dr. Leward Quan and cancer center [facility]  Scanned into chart  [place]

## 2017-05-15 ENCOUNTER — Other Ambulatory Visit: Payer: Self-pay | Admitting: *Deleted

## 2017-05-15 NOTE — Telephone Encounter (Addendum)
Pharmacy called and asked for refill of her Tasigna, Patient was a No Show her last appointment 12/26 and does not have any appts in future scheduled. Please advise

## 2017-05-15 NOTE — Telephone Encounter (Signed)
Per Dr Tasia Catchings, Do not refill medicine. Will await patient to call us to reschedule her appointment . Pharmacy notified

## 2017-05-15 NOTE — Telephone Encounter (Signed)
Can we approach patient to schedule her an appointment? Thanks.

## 2017-05-15 NOTE — Telephone Encounter (Signed)
Left vm for pt to return our call  

## 2017-05-16 NOTE — Telephone Encounter (Signed)
Called pt today, scheduled her an appt to see Dr Tasia Catchings next week

## 2017-05-22 ENCOUNTER — Inpatient Hospital Stay: Payer: BLUE CROSS/BLUE SHIELD

## 2017-05-22 ENCOUNTER — Encounter: Payer: Self-pay | Admitting: Oncology

## 2017-05-22 ENCOUNTER — Other Ambulatory Visit: Payer: Self-pay

## 2017-05-22 ENCOUNTER — Inpatient Hospital Stay: Payer: BLUE CROSS/BLUE SHIELD | Attending: Oncology | Admitting: Oncology

## 2017-05-22 VITALS — BP 113/77 | HR 77 | Temp 97.9°F | Resp 16 | Wt 184.4 lb

## 2017-05-22 DIAGNOSIS — C9211 Chronic myeloid leukemia, BCR/ABL-positive, in remission: Secondary | ICD-10-CM | POA: Diagnosis not present

## 2017-05-22 DIAGNOSIS — C9210 Chronic myeloid leukemia, BCR/ABL-positive, not having achieved remission: Secondary | ICD-10-CM

## 2017-05-22 LAB — CBC WITH DIFFERENTIAL/PLATELET
Basophils Absolute: 0 10*3/uL (ref 0–0.1)
Basophils Relative: 1 %
EOS ABS: 0.2 10*3/uL (ref 0–0.7)
Eosinophils Relative: 6 %
HEMATOCRIT: 34.5 % — AB (ref 35.0–47.0)
HEMOGLOBIN: 11.8 g/dL — AB (ref 12.0–16.0)
Lymphocytes Relative: 39 %
Lymphs Abs: 1.5 10*3/uL (ref 1.0–3.6)
MCH: 30.7 pg (ref 26.0–34.0)
MCHC: 34.2 g/dL (ref 32.0–36.0)
MCV: 89.8 fL (ref 80.0–100.0)
MONOS PCT: 7 %
Monocytes Absolute: 0.3 10*3/uL (ref 0.2–0.9)
NEUTROS ABS: 1.8 10*3/uL (ref 1.4–6.5)
NEUTROS PCT: 47 %
PLATELETS: 233 10*3/uL (ref 150–440)
RBC: 3.84 MIL/uL (ref 3.80–5.20)
RDW: 14.9 % — AB (ref 11.5–14.5)
WBC: 3.8 10*3/uL (ref 3.6–11.0)

## 2017-05-22 LAB — COMPREHENSIVE METABOLIC PANEL
ALBUMIN: 3.9 g/dL (ref 3.5–5.0)
ALT: 15 U/L (ref 14–54)
AST: 19 U/L (ref 15–41)
Alkaline Phosphatase: 68 U/L (ref 38–126)
Anion gap: 5 (ref 5–15)
BUN: 9 mg/dL (ref 6–20)
CHLORIDE: 103 mmol/L (ref 101–111)
CO2: 25 mmol/L (ref 22–32)
CREATININE: 0.7 mg/dL (ref 0.44–1.00)
Calcium: 8.7 mg/dL — ABNORMAL LOW (ref 8.9–10.3)
GFR calc non Af Amer: >60 mL/min (ref >60)
GLUCOSE: 95 mg/dL (ref 65–99)
Potassium: 3.9 mmol/L (ref 3.5–5.1)
SODIUM: 133 mmol/L — AB (ref 135–145)
Total Bilirubin: 0.5 mg/dL (ref 0.3–1.2)
Total Protein: 7.7 g/dL (ref 6.5–8.1)

## 2017-05-22 MED ORDER — NILOTINIB HCL 150 MG PO CAPS: 300.0000 mg | ORAL_CAPSULE | Freq: Two times a day (BID) | ORAL | 2 refills | Status: DC

## 2017-05-22 NOTE — Progress Notes (Signed)
Hematology/Oncology follow up note Jennifer English Telephone:([phone] [phone]   Patient Care Team: McLean-Scocuzza, Nino Glow, MD as PCP - General (Internal Medicine)  REFERRING PROVIDER: Previous oncologist from [place] Dr.Hasan Rizvi CHIEF COMPLAINTS/PURPOSE OF CONSULTATION:  Management of CML  HISTORY OF PRESENTING ILLNESS:  Jennifer English is a  52 y.o.  female with PMH listed below who was referred to by her oncologist in [place]. Patient relocated from [place] to [place] this summer. She has not had local pcp yet. Extensive medical records review was performed.  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 She had dyuria symptoms at that time and lab work up found extreme leukocytosis with WBC 173,500 and she was initially started on hydrea. After CML was confirmed, she was started on Tasigna 300mg  BID since then and has achieved remission. . Also had splenomegaly at presentation about 5-6 cm below LCM, resolved. . She reports being complaint on nilotinib however, she has had period of time when her medication runs out. Although she moved this summer, due to lot of stresses associated with relocation and new job, she waited until now see hematologist.  Denies any fever, chill, weight loss, bleeding events. She used to have EKG done every 3 months and faxed to oncologist. She has not had any EKG done after July this year. No PCP Also reports recently feeling urine has odor and also very mild burning sensation when passing urine. She requests to have urine tested.  Has hypothyroidism, not taking thyroid supplements.  History of iron deficiency due to heavy menstrual periods/uterine fibroids, taking iron supplements.  # 10/09/2012, bone marrow aspirate smears and clots: Markedly hypercellular marrow with prominent myeloid hyperplasia and increasing small hypolobated megakaryocytes. The overall findings are consistent with chronic myeloid leukemia, chronic  phase. # 03/22/2015 BCR ABL quantification PCR showed e13a2 (b2a2 p210) transcript <0.001%,e14a2 ( b3a2, p210)  transcript 0.023%, e1a2 (p190) transcript <0.001% # 08/17/2015 BCR ABL quantification PCR showed e13a2 (b2a2 p210) transcript 0.011%,e14a2 ( b3a2, p210)  transcript <0.001%, e1a2 (p190) transcript <0.001%  # 04/25/2016 BCR ABL quantification PCR showed e13a2 (b2a2 p210) transcript <0.001%,e14a2 ( b3a2, p210)  transcript <0.001%, e1a2 (p190) transcript <0.001%  #09/17/2016  BCR ABL quantification PCR showed e13a2 (b2a2 p210) transcript <0.0032%,e14a2 ( b3a2, p210)  transcript <0.0032%, e1a2 (p190) transcript <0.0032%   02/19/2017 BCR ABL quantification PCR showed b2a2 transcript <0.0032%, b3a2 transcript <0.0032%, E1A2 transcript <0.0032% INTERVAL HISTORY Jennifer English is a 52 y.o. female who has above history reviewed by me today presents for follow up visit for management of CML.  She no showed for her follow up appointment with me. Recently her Rae Lips needs to be renewed, and she presented for follow up. She tells me that she has been often missing evening dose of medication and sometimes forgets that she can not take Nilotinib with food.  She has a stressful job. Has just followed up with primary care physician and was referred to endocrinology for hypothyroidism. No fever, chills, cough, diarrhea, chest pain, abdominal pain.   Review of Systems  Constitutional: Negative for chills, fever and malaise/fatigue.  HENT: Negative for hearing loss, nosebleeds and tinnitus.   Eyes: Negative for blurred vision and pain.  Respiratory: Negative for cough and sputum production.   Cardiovascular: Negative for chest pain.  Gastrointestinal: Negative for abdominal pain, heartburn, nausea and vomiting.  Genitourinary: Negative for dysuria.  Musculoskeletal: Negative for back pain and myalgias.  Skin: Negative for rash.  Neurological: Negative  for dizziness, tingling and tremors.   Endo/Heme/Allergies: Negative for environmental allergies. Does not bruise/bleed easily.  Psychiatric/Behavioral: Negative for depression and suicidal ideas.    MEDICAL HISTORY:  Past Medical History:  Diagnosis Date  . Allergy   . CML (chronic myelocytic leukemia) ([place])   . Heart murmur   . Thyroid disease    h/o thyroid d/o not on meds   . Uterine fibroid   . UTI (urinary tract infection)    x2     SURGICAL HISTORY: Past Surgical History:  Procedure Laterality Date  . CESAREAN SECTION     x 1 2003   . CYSTECTOMY     middle finger right hand   . OTHER SURGICAL HISTORY     cyst removed from finger  . TONSILECTOMY, ADENOIDECTOMY, BILATERAL MYRINGOTOMY AND TUBES    . TYMPANOSTOMY TUBE PLACEMENT     1978    SOCIAL HISTORY: Social History   Socioeconomic History  . Marital status: Married    Spouse name: Not on file  . Number of children: Not on file  . Years of education: Not on file  . Highest education level: Not on file  Social Needs  . Financial resource strain: Not on file  . Food insecurity - worry: Not on file  . Food insecurity - inability: Not on file  . Transportation needs - medical: Not on file  . Transportation needs - non-medical: Not on file  Occupational History  . Not on file  Tobacco Use  . Smoking status: Never Smoker  . Smokeless tobacco: Never Used  Substance and Sexual Activity  . Alcohol use: No    Frequency: Never  . Drug use: No  . Sexual activity: Yes    Birth control/protection: None  Other Topics Concern  . Not on file  Social History Narrative   Married lives with daughter husband and mother in law   32 daughter    Works [employer] safety [employer]    Elizabeth from [place]     FAMILY HISTORY: Family History  Problem Relation Age of Onset  . Heart disease Father        CAD with stents   . Hyperlipidemia Father   . Hypertension Father   . Dementia Maternal Grandfather   . Melanoma Paternal  Grandmother   . Bladder Cancer Paternal Grandmother   . Cancer Paternal Grandmother        lung/colon ?primary   . Arthritis Paternal Grandmother   . Bladder Cancer Paternal Grandfather   . Cancer Paternal Grandfather        bladder died in 53s   . Early death Paternal Grandfather   . Alcohol abuse Maternal Grandmother   . Hepatitis Maternal Grandmother   . Early death Maternal Grandmother   . Kidney disease Maternal Grandmother   . Asthma Brother   . Asthma Brother     ALLERGIES:  is allergic to ibuprofen; levoxyl [levothyroxine sodium]; synthroid [levothyroxine]; aspirin; avocado; eggs or egg-derived products; and penicillins.  MEDICATIONS:  Current Outpatient Medications  Medication Sig Dispense Refill  . Ascorbic Acid (VITAMIN C) 100 MG tablet Take 100 mg by mouth daily.    . ferrous sulfate 325 (65 FE) MG tablet Take 325 mg by mouth daily with breakfast.    . magnesium citrate SOLN Take 1 Bottle by mouth once.    . nilotinib (TASIGNA) 150 MG capsule Take 2 capsules (300 mg total) by mouth 2 (two) times daily. Take  on an empty stomach, 1 hour before or 2 hours after food. 120 capsule 2   No current facility-administered medications for this visit.      PHYSICAL EXAMINATION: ECOG PERFORMANCE STATUS: 0 - Asymptomatic Vitals:   05/22/17 1354  BP: 113/77  Pulse: 77  Resp: 16  Temp: 97.9 F (36.6 C)   Filed Weights   05/22/17 1354  Weight: 184 lb 6.4 oz (83.6 kg)   Physical Exam  Constitutional: She is oriented to person, place, and time and well-developed, well-nourished, and in no distress. No distress.  HENT:  Head: Normocephalic.  Mouth/Throat: Oropharynx is clear and moist. No oropharyngeal exudate.  Eyes: Conjunctivae and EOM are normal. Pupils are equal, round, and reactive to light. No scleral icterus.  Neck: Normal range of motion. Neck supple.  Cardiovascular: Normal rate, regular rhythm and normal heart sounds. Exam reveals no gallop.  No murmur  heard. Pulmonary/Chest: Effort normal and breath sounds normal. She has no wheezes. She has no rales.  Abdominal: Soft. Bowel sounds are normal. She exhibits no mass. There is no tenderness.  Musculoskeletal: Normal range of motion. She exhibits no edema.  Lymphadenopathy:    She has no cervical adenopathy.  Neurological: She is alert and oriented to person, place, and time. No cranial nerve deficit. Coordination normal.  Skin: Skin is warm and dry. No erythema.  Psychiatric: Affect and judgment normal.      LABORATORY DATA:  I have reviewed the data as listed Lab Results  Component Value Date   WBC 3.8 05/22/2017   HGB 11.8 (L) 05/22/2017   HCT 34.5 (L) 05/22/2017   MCV 89.8 05/22/2017   PLT 233 05/22/2017   Recent Labs    02/19/17 1123 05/22/17 1329  NA 135 133*  K 3.9 3.9  CL 102 103  CO2 27 25  GLUCOSE 94 95  BUN 12 9  CREATININE 0.69 0.70  CALCIUM 8.8* 8.7*  GFRNONAA >60 >60  GFRAA >60 >60  PROT 7.8 7.7  ALBUMIN 3.9 3.9  AST 19 19  ALT 16 15  ALKPHOS 61 68  BILITOT 0.5 0.5       ASSESSMENT & PLAN:  1. CML (chronic myelocytic leukemia) ([facility])   In molecular remission.  Continue Tasigna 300mg  BID, refill 3 months supply.  Importance of medication compliance discussed with patient.  check EKGs today. EKG reviewed. Normal Qtc Continue follow up with primary care physician.  Discussed with patient about contraceptive measure to avoid pregnancy while on Nilotinib.   All questions were answered. The patient knows to call the clinic with any problems questions or concerns. Return of visit: 3 months.     [name], MD, PhD Hematology Oncology [facility] Pager- [phone] 05/22/2017

## 2017-05-23 ENCOUNTER — Telehealth: Payer: Self-pay | Admitting: Oncology

## 2017-05-23 NOTE — Telephone Encounter (Signed)
Oral Oncology Patient Advocate Encounter  Called [facility] to check on patients Tasigna. Spoke with Candice and they had not gotten it. I faxed over a copy of script and followed-up and they have it now and they are processing. With co-pay card a $0.00 co-pay. Should be sent out today.    [place] Patient Advocate [phone] 05/23/2017 11:26 AM

## 2017-05-26 ENCOUNTER — Telehealth: Payer: Self-pay | Admitting: Oncology

## 2017-05-26 NOTE — Telephone Encounter (Signed)
Oral Oncology Patient Advocate Encounter  Called [facility] to find out if patiens Tasigna had been shipped. They have been trying to get in touch with her. I called and spoke with patient and she is going to give them a call now at [phone].   I will follow-up Tuesday to make sure it went out.   [place] Patient Advocate [phone] 05/26/2017 8:35 AM

## 2017-05-28 ENCOUNTER — Encounter: Payer: Self-pay | Admitting: Internal Medicine

## 2017-05-28 DIAGNOSIS — D219 Benign neoplasm of connective and other soft tissue, unspecified: Secondary | ICD-10-CM | POA: Insufficient documentation

## 2017-05-29 ENCOUNTER — Telehealth: Payer: Self-pay | Admitting: Oncology

## 2017-05-29 NOTE — Telephone Encounter (Signed)
Oral Oncology Patient Advocate Encounter  Patients Tasigna will be delivered today per [facility].    [place] Patient Advocate [phone] 05/29/2017 8:49 AM

## 2017-06-03 ENCOUNTER — Encounter: Payer: Self-pay | Admitting: Oncology

## 2017-06-18 ENCOUNTER — Ambulatory Visit: Payer: BLUE CROSS/BLUE SHIELD | Admitting: Family Medicine

## 2017-06-18 ENCOUNTER — Encounter: Payer: Self-pay | Admitting: Family Medicine

## 2017-06-18 VITALS — BP 114/72 | HR 73 | Temp 98.1°F | Wt 180.8 lb

## 2017-06-18 DIAGNOSIS — L509 Urticaria, unspecified: Secondary | ICD-10-CM

## 2017-06-18 MED ORDER — PREDNISONE 20 MG PO TABS: ORAL_TABLET | ORAL | 0 refills | Status: DC

## 2017-06-18 MED ORDER — RANITIDINE HCL 150 MG PO TABS: 150.0000 mg | ORAL_TABLET | Freq: Two times a day (BID) | ORAL | 0 refills | Status: DC

## 2017-06-18 NOTE — Patient Instructions (Signed)
I have sent prednisone and ranitidine to your pharmacy.  Take ranitidine for 7-10 days as well as over the counter cetirizine 10 mg daily for 7-10 days Stop at desk to schedule allergy appointment    Hives Hives (urticaria) are itchy, red, swollen areas on your skin. Hives can appear on any part of your body and can vary in size. They can be as small as the tip of a pen or much larger. Hives often fade within 24 hours (acute hives). In other cases, new hives appear after old ones fade. This cycle can continue for several days or weeks (chronic hives). Hives result from your body's reaction to an irritant or to something that you are allergic to (trigger). When you are exposed to a trigger, your body releases a chemical (histamine) that causes redness, itching, and swelling. You can get hives immediately after being exposed to a trigger or hours later. Hives do not spread from person to person (are not contagious). Your hives may get worse with scratching, exercise, and emotional stress. What are the causes? Causes of this condition include:  Allergies to certain foods or ingredients.  Insect bites or stings.  Exposure to pollen or pet dander.  Contact with latex or chemicals.  Spending time in sunlight, heat, or cold (exposure).  Exercise.  Stress.  You can also get hives from some medical conditions and treatments. These include:  Viruses, including the common cold.  Bacterial infections, such as urinary tract infections and strep throat.  Disorders such as vasculitis, lupus, or thyroid disease.  Certain medications.  Allergy shots.  Blood transfusions.  Sometimes, the cause of hives is not known (idiopathic hives). What increases the risk? This condition is more likely to develop in:  Women.  People who have food allergies, especially to citrus fruits, milk, eggs, peanuts, tree nuts, or shellfish.  People who are allergic  to: ? Medicines. ? Latex. ? Insects. ? Animals. ? Pollen.  People who have certain medical conditions, includinglupus or thyroid disease.  What are the signs or symptoms? The main symptom of this condition is raised, itchyred or white bumps or patches on your skin. These areas may:  Become large and swollen (welts).  Change in shape and location, quickly and repeatedly.  Be separate hives or connect over a large area of skin.  Sting or become painful.  Turn white when pressed in the center (blanch).  In severe cases, yourhands, feet, and face may also become swollen. This may occur if hives develop deeper in your skin. How is this diagnosed? This condition is diagnosed based on your symptoms, medical history, and physical exam. Your skin, urine, or blood may be tested to find out what is causing your hives and to rule out other health issues. Your health care provider may also remove a small sample of skin from the affected area and examine it under a microscope (biopsy). How is this treated? Treatment depends on the severity of your condition. Your health care provider may recommend using cool, wet cloths (cool compresses) or taking cool showers to relieve itching. Hives are sometimes treated with medicines, including:  Antihistamines.  Corticosteroids.  Antibiotics.  An injectable medicine (omalizumab). Your health care provider may prescribe this if you have chronic idiopathic hives and you continue to have symptoms even after treatment with antihistamines.  Severe cases may require an emergency injection of adrenaline (epinephrine) to prevent a life-threatening allergic reaction (anaphylaxis). Follow these instructions at home: Medicines  Take or apply over-the-counter  and prescription medicines only as told by your health care provider.  If you were prescribed an antibiotic medicine, use it as told by your health care provider. Do not stop taking the antibiotic even  if you start to feel better. Skin Care  Apply cool compresses to the affected areas.  Do not scratch or rub your skin. General instructions  Do not take hot showers or baths. This can make itching worse.  Do not wear tight-fitting clothing.  Use sunscreen and wear protective clothing when you are outside.  Avoid any substances that cause your hives. Keep a journal to help you track what causes your hives. Write down: ? What medicines you take. ? What you eat and drink. ? What products you use on your skin.  Keep all follow-up visits as told by your health care provider. This is important. Contact a health care provider if:  Your symptoms are not controlled with medicine.  Your joints are painful or swollen. Get help right away if:  You have a fever.  You have pain in your abdomen.  Your tongue or lips are swollen.  Your eyelids are swollen.  Your chest or throat feels tight.  You have trouble breathing or swallowing. These symptoms may represent a serious problem that is an emergency. Do not wait to see if the symptoms will go away. Get medical help right away. Call your local emergency services (911 in the [place] Do not drive yourself to the hospital. This information is not intended to replace advice given to you by your health care provider. Make sure you discuss any questions you have with your health care provider. Document Released: 03/11/2005 Document Revised: 08/09/2015 Document Reviewed: 12/28/2014 Elsevier Interactive Patient Education  2018 [employer].

## 2017-06-18 NOTE — Progress Notes (Signed)
Subjective:    Patient ID: Jennifer English, female    DOB: 30-Aug-1965, 52 y.o.   MRN: [id number]  HPI This is a 52 yo female who presents today with rash on arms and legs x 3 days. First noticed on legs has progressed to arms, trunk and chest. Only itchy on legs.  Was in [place] last week but left 4 days ago. Has several food and medication allergies. Took benadryl x 1 without improvement. No new medications. No new soaps, lotions, shampoo. No fever, no aches. No facial swelling or sensation or swelling of lips or throat.    Past Medical History:  Diagnosis Date  . Allergy   . CML (chronic myelocytic leukemia) ([place])   . Heart murmur   . Thyroid disease    h/o thyroid d/o not on meds   . Uterine fibroid   . UTI (urinary tract infection)    x2   . 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 CESAREAN SECTION     x 1 2003   . CYSTECTOMY     middle finger right hand   . OTHER SURGICAL HISTORY     cyst removed from finger  . TONSILECTOMY, ADENOIDECTOMY, BILATERAL MYRINGOTOMY AND TUBES    . TYMPANOSTOMY TUBE PLACEMENT     1978   Family History  Problem Relation Age of Onset  . Heart disease Father        CAD with stents   . Hyperlipidemia Father   . Hypertension Father   . Dementia Maternal Grandfather   . Melanoma Paternal Grandmother   . Bladder Cancer Paternal Grandmother   . Cancer Paternal Grandmother        lung/colon ?primary   . Arthritis Paternal Grandmother   . Bladder Cancer Paternal Grandfather   . Cancer Paternal Grandfather        bladder died in 25s   . Early death Paternal Grandfather   . Alcohol abuse Maternal Grandmother   . Hepatitis Maternal Grandmother   . Early death Maternal Grandmother   . Kidney disease Maternal Grandmother   . Asthma Brother   . Asthma Brother    Social History   Tobacco Use  . Smoking status: Never Smoker  . Smokeless tobacco: Never Used  Substance  Use Topics  . Alcohol use: No    Frequency: Never  . Drug use: No      Review of Systems Per HPI    Objective:   Physical Exam  Constitutional: She is oriented to person, place, and time. She appears well-developed and well-nourished. No distress.  HENT:  Head: Normocephalic and atraumatic.  Mouth/Throat: Oropharynx is clear and moist.  Cardiovascular: Normal rate.  Pulmonary/Chest: Effort normal.  Neurological: She is alert and oriented to person, place, and time.  Skin: Skin is warm and dry. Rash (scattered wheals on arms, upper legs, upper chest and few on back. No facial lesions. ) noted. She is not diaphoretic.  Psychiatric: She has a normal mood and affect. Her behavior is normal. Judgment and thought content normal.  Vitals reviewed.     BP 114/72   Pulse 73   Temp 98.1 F (36.7 C) (Oral)   Wt 180 lb 12 oz (82 kg)   LMP 06/13/2017   SpO2 96%   BMI 31.03 kg/m  Wt Readings from Last 3 Encounters:  06/18/17 180 lb 12 oz (82 kg)  05/22/17 184 lb 6.4  oz (83.6 kg)  04/03/17 181 lb 8 oz (82.3 kg)       Assessment & Plan:  1. Hives - Provided written and verbal information regarding diagnosis and treatment. - RTC/ER precautions reviewed - predniSONE (DELTASONE) 20 MG tablet; Take 3 x 2 days, 2 x 2 days, 1 x 4 days  Dispense: 14 tablet; Refill: 0- encouraged her to take with food and full glass of water, discussed potential side effects.  - Ambulatory referral to Allergy - ranitidine (ZANTAC) 150 MG tablet; Take 1 tablet (150 mg total) by mouth 2 (two) times daily.  Dispense: 30 tablet; Refill: 0 - long acting antihistamine daily for 7-10 days - follow up with PCP prn  Clarene Reamer, FNP-BC  [facility], [place]  06/18/2017 8:44 PM

## 2017-06-25 ENCOUNTER — Telehealth: Payer: Self-pay

## 2017-06-25 NOTE — Telephone Encounter (Signed)
Left message for patient to call Dezaree Tracey back in regards to a referral-Cassie Shedlock V Lateef Juncaj, RMA   

## 2017-07-03 ENCOUNTER — Ambulatory Visit: Payer: BLUE CROSS/BLUE SHIELD | Admitting: Internal Medicine

## 2017-07-03 ENCOUNTER — Encounter: Payer: Self-pay | Admitting: Internal Medicine

## 2017-07-03 VITALS — BP 98/60 | HR 70 | Temp 98.0°F | Ht 64.0 in | Wt 189.4 lb

## 2017-07-03 DIAGNOSIS — E039 Hypothyroidism, unspecified: Secondary | ICD-10-CM

## 2017-07-03 DIAGNOSIS — R011 Cardiac murmur, unspecified: Secondary | ICD-10-CM | POA: Insufficient documentation

## 2017-07-03 DIAGNOSIS — Z1231 Encounter for screening mammogram for malignant neoplasm of breast: Secondary | ICD-10-CM

## 2017-07-03 DIAGNOSIS — D219 Benign neoplasm of connective and other soft tissue, unspecified: Secondary | ICD-10-CM | POA: Diagnosis not present

## 2017-07-03 DIAGNOSIS — N938 Other specified abnormal uterine and vaginal bleeding: Secondary | ICD-10-CM

## 2017-07-03 NOTE — Patient Instructions (Addendum)
Please f/u in 4 months sooner if you want to do pap  Think about pap smear at follow up  Please schedule mammogram     Abnormal Uterine Bleeding Abnormal uterine bleeding means bleeding more than usual from your uterus. It can include:  Bleeding between periods.  Bleeding after sex.  Bleeding that is heavier than normal.  Periods that last longer than usual.  Bleeding after you have stopped having your period (menopause).  There are many problems that may cause this. You should see a doctor for any kind of bleeding that is not normal. Treatment depends on the cause of the bleeding. Follow these instructions at home:  Watch your condition for any changes.  Do not use tampons, douche, or have sex, if your doctor tells you not to.  Change your pads often.  Get regular well-woman exams. Make sure they include a pelvic exam and cervical cancer screening.  Keep all follow-up visits as told by your doctor. This is important. Contact a doctor if:  The bleeding lasts more than one week.  You feel dizzy at times.  You feel like you are going to throw up (nauseous).  You throw up. Get help right away if:  You pass out.  You have to change pads every hour.  You have belly (abdominal) pain.  You have a fever.  You get sweaty.  You get weak.  You passing large blood clots from your vagina. Summary  Abnormal uterine bleeding means bleeding more than usual from your uterus.  There are many problems that may cause this. You should see a doctor for any kind of bleeding that is not normal.  Treatment depends on the cause of the bleeding. This information is not intended to replace advice given to you by your health care provider. Make sure you discuss any questions you have with your health care provider. Document Released: 01/06/2009 Document Revised: 03/05/2016 Document Reviewed: 03/05/2016 Elsevier Interactive Patient Education  2017 Elsevier Inc.   Uterine  Fibroids Uterine fibroids are tissue masses (tumors). They are also called leiomyomas. They can develop inside of a woman's womb (uterus). They can grow very large. Fibroids are not cancerous (benign). Most fibroids do not require medical treatment. Follow these instructions at home:  Keep all follow-up visits as told by your doctor. This is important.  Take medicines only as told by your doctor. ? If you were prescribed a hormone treatment, take the hormone medicines exactly as told. ? Do not take aspirin. It can cause bleeding.  Ask your doctor about taking iron pills and increasing the amount of dark green, leafy vegetables in your diet. These actions can help to boost your blood iron levels.  Pay close attention to your period. Tell your doctor about any changes, such as: ? Increased blood flow. This may require you to use more pads or tampons than usual per month. ? A change in the number of days that your period lasts per month. ? A change in symptoms that come with your period, such as back pain or cramping in your belly area (abdomen). Contact a doctor if:  You have pain in your back or the area between your hip bones (pelvic area) that is not controlled by medicines.  You have pain in your abdomen that is not controlled with medicines.  You have an increase in bleeding between and during periods.  You soak tampons or pads in a half hour or less.  You feel lightheaded.  You feel extra  tired.  You feel weak. Get help right away if:  You pass out (faint).  You have a sudden increase in pelvic pain. This information is not intended to replace advice given to you by your health care provider. Make sure you discuss any questions you have with your health care provider. Document Released: 04/13/2010 Document Revised: 11/10/2015 Document Reviewed: 09/07/2013 Elsevier Interactive Patient Education  [employer].

## 2017-07-03 NOTE — Progress Notes (Signed)
Pre visit review using our clinic review tool, if applicable. No additional management support is needed unless otherwise documented below in the visit note. 

## 2017-07-04 ENCOUNTER — Encounter: Payer: Self-pay | Admitting: Internal Medicine

## 2017-07-06 ENCOUNTER — Encounter: Payer: Self-pay | Admitting: Internal Medicine

## 2017-07-06 NOTE — Progress Notes (Addendum)
Chief Complaint  Patient presents with  . Follow-up   F/u  1. 06/18/17 she had episode of hives after staying in hotel in [place] she had a rash on her legs initially not itchy then spread to back. She tried benadryl which normally helps during episodes but it didn't work this time she has a h/o anaphylaxis so uses caution with topical products and consumption of certain foods. She was seen and given prednisone and allegra which helped and she did not take Zantac. She has allergy appt 07/29/17 with Dr. Donneta Romberg and will undergo allergy testing  2. Hypothyroidism allergic to thyroid meds per pt she has not sch endocrine appt yet  3. Vision changes she has not sch eye MD appt yet though prev referred as #2 4. She c/o irregular cycles and she did not have a cycle from 11/2016 to 05/2017 then started with heavy menses in 05/2017. She does have h/o fibroids   Review of Systems  Constitutional: Negative for malaise/fatigue and weight loss.  HENT: Negative for hearing loss.   Eyes:       Changes in vision has not sch eye md yet   Respiratory: Negative for cough and shortness of breath.   Cardiovascular: Negative for chest pain.  Gastrointestinal: Negative for abdominal pain.  Genitourinary:       +irregular menses no cycle then heavy menses   Musculoskeletal: Negative for falls.  Skin: Negative for rash.  Neurological: Negative for headaches.  Psychiatric/Behavioral: Negative for depression.   Past Medical History:  Diagnosis Date  . Allergy   . CML (chronic myelocytic leukemia) ([place])   . Fibroids   . Heart murmur   . Thyroid disease    h/o thyroid d/o not on meds   . Uterine fibroid   . UTI (urinary tract infection)    x2   . 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 CESAREAN SECTION     x 1 2003   . CYSTECTOMY     middle finger right hand   . OTHER SURGICAL HISTORY     cyst removed from finger  . TONSILECTOMY,  ADENOIDECTOMY, BILATERAL MYRINGOTOMY AND TUBES    . TYMPANOSTOMY TUBE PLACEMENT     1978   Family History  Problem Relation Age of Onset  . Heart disease Father        CAD with stents   . Hyperlipidemia Father   . Hypertension Father   . Dementia Maternal Grandfather   . Melanoma Paternal Grandmother   . Bladder Cancer Paternal Grandmother   . Cancer Paternal Grandmother        lung/colon ?primary   . Arthritis Paternal Grandmother   . Bladder Cancer Paternal Grandfather   . Cancer Paternal Grandfather        bladder died in 80s   . Early death Paternal Grandfather   . Alcohol abuse Maternal Grandmother   . Hepatitis Maternal Grandmother   . Early death Maternal Grandmother   . Kidney disease Maternal Grandmother   . Macular degeneration Maternal Grandmother   . Liver disease Maternal Grandmother        ?alcoholic hepatitis   . Asthma Brother   . Asthma Brother   . Macular degeneration Maternal Aunt    Social History   Socioeconomic History  . Marital status: Married    Spouse name: Not on file  . Number of children: Not on file  . Years  of education: Not on file  . Highest education level: Not on file  Occupational History  . Not on file  Social Needs  . Financial resource strain: Not on file  . Food insecurity:    Worry: Not on file    Inability: Not on file  . Transportation needs:    Medical: Not on file    Non-medical: Not on file  Tobacco Use  . Smoking status: Never Smoker  . Smokeless tobacco: Never Used  Substance and Sexual Activity  . Alcohol use: No    Frequency: Never  . Drug use: No  . Sexual activity: Yes    Birth control/protection: None  Lifestyle  . Physical activity:    Days per week: Not on file    Minutes per session: Not on file  . Stress: Not on file  Relationships  . Social connections:    Talks on phone: Not on file    Gets together: Not on file    Attends religious service: Not on file    Active member of club or  organization: Not on file    Attends meetings of clubs or organizations: Not on file    Relationship status: Not on file  . Intimate partner violence:    Fear of current or ex partner: Not on file    Emotionally abused: Not on file    Physically abused: Not on file    Forced sexual activity: Not on file  Other Topics Concern  . 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 Ascorbic Acid (VITAMIN C) 100 MG tablet Take 100 mg by mouth daily.  . ferrous sulfate 325 (65 FE) MG tablet Take 325 mg by mouth daily with breakfast.  . magnesium citrate SOLN Take 1 Bottle by mouth once.  . nilotinib (TASIGNA) 150 MG capsule Take 2 capsules (300 mg total) by mouth 2 (two) times daily. Take on an empty stomach, 1 hour before or 2 hours after food.  . [DISCONTINUED] ranitidine (ZANTAC) 150 MG tablet Take 1 tablet (150 mg total) by mouth 2 (two) times daily.   Allergies  Allergen Reactions  . Ibuprofen   . Levoxyl [Levothyroxine Sodium]     Rash    . Synthroid [Levothyroxine]     rash  . Aspirin Rash  . Avocado Rash  . Eggs Or Egg-Derived Products Rash  . Penicillins Rash   Recent Results (from the past 2160 hour(s))  Thyroid peroxidase antibody     Status: Abnormal   Collection Time: 04/17/17  8:06 AM  Result Value Ref Range   Thyroperoxidase Ab SerPl-aCnc >900 (H) <9 IU/mL  Comprehensive metabolic panel     Status: Abnormal   Collection Time: 05/22/17  1:29 PM  Result Value Ref Range   Sodium 133 (L) 135 - 145 mmol/L   Potassium 3.9 3.5 - 5.1 mmol/L   Chloride 103 101 - 111 mmol/L   CO2 25 22 - 32 mmol/L   Glucose, Bld 95 65 - 99 mg/dL   BUN 9 6 - 20 mg/dL   Creatinine, Ser 0.70 0.44 - 1.00 mg/dL   Calcium 8.7 (L) 8.9 - 10.3 mg/dL   Total Protein 7.7 6.5 - 8.1 g/dL   Albumin 3.9 3.5 -  5.0  g/dL   AST 19 15 - 41 U/L   ALT 15 14 - 54 U/L   Alkaline Phosphatase 68 38 - 126 U/L   Total Bilirubin 0.5 0.3 - 1.2 mg/dL   GFR calc non Af Amer >60 >60 mL/min   GFR calc Af Amer >60 >60 mL/min    Comment: (NOTE) The eGFR has been calculated using the CKD EPI equation. This calculation has not been validated in all clinical situations. eGFR's persistently <60 mL/min signify possible Chronic Kidney Disease.    Anion gap 5 5 - 15    Comment: Performed at [facility], [address] [place], [place] [address]  CBC with Differential/Platelet     Status: Abnormal   Collection Time: 05/22/17  1:29 PM  Result Value Ref Range   WBC 3.8 3.6 - 11.0 K/uL   RBC 3.84 3.80 - 5.20 MIL/uL   Hemoglobin 11.8 (L) 12.0 - 16.0 g/dL   HCT 34.5 (L) 35.0 - 47.0 %   MCV 89.8 80.0 - 100.0 fL   MCH 30.7 26.0 - 34.0 pg   MCHC 34.2 32.0 - 36.0 g/dL   RDW 14.9 (H) 11.5 - 14.5 %   Platelets 233 150 - 440 K/uL   Neutrophils Relative % 47 %   Neutro Abs 1.8 1.4 - 6.5 K/uL   Lymphocytes Relative 39 %   Lymphs Abs 1.5 1.0 - 3.6 K/uL   Monocytes Relative 7 %   Monocytes Absolute 0.3 0.2 - 0.9 K/uL   Eosinophils Relative 6 %   Eosinophils Absolute 0.2 0 - 0.7 K/uL   Basophils Relative 1 %   Basophils Absolute 0.0 0 - 0.1 K/uL    Comment: Performed at [facility], [address] [place], [place] [address]   Objective  Body mass index is 32.51 kg/m. Wt Readings from Last 3 Encounters:  07/03/17 189 lb 6.4 oz (85.9 kg)  06/18/17 180 lb 12 oz (82 kg)  05/22/17 184 lb 6.4 oz (83.6 kg)   Temp Readings from Last 3 Encounters:  07/03/17 98 F (36.7 C) (Oral)  06/18/17 98.1 F (36.7 C) (Oral)  05/22/17 97.9 F (36.6 C) (Tympanic)   BP Readings from Last 3 Encounters:  07/03/17 98/60  06/18/17 114/72  05/22/17 113/77   Pulse Readings from Last 3 Encounters:  07/03/17 70  06/18/17 73  05/22/17 77    Physical Exam  Constitutional: She is oriented to person, place, and time.  Vital signs are normal. She appears well-developed and well-nourished.  HENT:  Head: Normocephalic and atraumatic.  Mouth/Throat: Oropharynx is clear and moist and mucous membranes are normal.  Eyes: Pupils are equal, round, and reactive to light. Conjunctivae are normal.  Cardiovascular: Normal rate and regular rhythm.  Murmur heard. Pulmonary/Chest: Effort normal and breath sounds normal.  Neurological: She is alert and oriented to person, place, and time. Gait normal.  Skin: Skin is warm, dry and intact.  Psychiatric: She has a normal mood and affect. Her speech is normal and behavior is normal. Judgment and thought content normal. Cognition and memory are normal.  Nursing note and vitals reviewed.   Assessment   1. Hives resolved, unknown etiology  2. Hypothyroidism  3. Irregular menses with amenorrhea and DUB 4. HM Plan  1.  appt allergy Dr. Donneta Romberg 07/29/17 testing 2. Pt needs to sch Endocrine appt referral in 3.  Pelvic and TVUS with h/o fibroid Will do pap at f/u  Consider OB/GYN referral in future  4.  Declines flu shot  Pt declines vaccines she does not normally take vaccines   Per pt last pap 2014/2015 no h/o abnormal  -need to get records [facility] last pap/mammo and also records Dr. Jerl Mina in [place] (labs) signed release today -pap 05/09/11 neg pap neg HPV -reviewed records other Dr. Jerl Mina scanned into chart impt noted below:  hosp 2014 extreme leukocytosis in [place] had CT ab/pelvis 7/2020mld right hydronephrosis and hydoureter 3 mm calcification calculus or phlebolith, 8 cm calcified uterine fibroid   Consider colonoscopy age 52 Mammogram last had 2014 per pt last record states 2008 referred today pt is not sure when last mammo was Consider check lipid (per pt had end 2017) , hep B status in future  Last had skin check 2 years ago consider in future   Provider: Dr. TOlivia MackieMcLean-Scocuzza-Internal Medicine

## 2017-07-21 ENCOUNTER — Telehealth: Payer: Self-pay

## 2017-07-21 ENCOUNTER — Ambulatory Visit: Admission: RE | Admit: 2017-07-21 | Payer: BLUE CROSS/BLUE SHIELD | Source: Ambulatory Visit

## 2017-07-21 NOTE — Telephone Encounter (Signed)
FYI

## 2017-07-21 NOTE — Telephone Encounter (Signed)
Copied from [place] [id number]. Topic: General - Other >> Jul 21, 2017  2:49 PM Lennox Solders wrote: Reason for CRM: morgan from [facility] out pt center is calling pt did not show up her ultrasound.

## 2017-07-21 NOTE — Telephone Encounter (Signed)
Please call pt ask her to resch Ultrasounds   Thanks tMS

## 2017-07-22 NOTE — Telephone Encounter (Signed)
Left message for patient to return call back to reschedule [place]. [facility] outpatient radiology called and said patient missed [place].  [phone] is the number to used to schedule new [place] appointment.  [facility] may inform patient.

## 2017-08-20 ENCOUNTER — Inpatient Hospital Stay: Payer: BLUE CROSS/BLUE SHIELD | Attending: Oncology

## 2017-08-20 ENCOUNTER — Inpatient Hospital Stay: Payer: BLUE CROSS/BLUE SHIELD | Admitting: Oncology

## 2017-08-24 ENCOUNTER — Encounter: Payer: Self-pay | Admitting: Oncology

## 2017-09-03 ENCOUNTER — Inpatient Hospital Stay (HOSPITAL_BASED_OUTPATIENT_CLINIC_OR_DEPARTMENT_OTHER): Payer: BLUE CROSS/BLUE SHIELD | Admitting: Oncology

## 2017-09-03 ENCOUNTER — Inpatient Hospital Stay: Payer: BLUE CROSS/BLUE SHIELD | Attending: Oncology

## 2017-09-03 ENCOUNTER — Other Ambulatory Visit: Payer: Self-pay

## 2017-09-03 ENCOUNTER — Encounter: Payer: Self-pay | Admitting: Oncology

## 2017-09-03 DIAGNOSIS — I10 Essential (primary) hypertension: Secondary | ICD-10-CM | POA: Diagnosis not present

## 2017-09-03 DIAGNOSIS — C9210 Chronic myeloid leukemia, BCR/ABL-positive, not having achieved remission: Secondary | ICD-10-CM

## 2017-09-03 DIAGNOSIS — C9211 Chronic myeloid leukemia, BCR/ABL-positive, in remission: Secondary | ICD-10-CM | POA: Diagnosis not present

## 2017-09-03 LAB — CBC WITH DIFFERENTIAL/PLATELET
BASOS ABS: 0 10*3/uL (ref 0–0.1)
BASOS PCT: 1 %
EOS ABS: 0.2 10*3/uL (ref 0–0.7)
Eosinophils Relative: 6 %
HCT: 34.4 % — ABNORMAL LOW (ref 35.0–47.0)
HEMOGLOBIN: 11.7 g/dL — AB (ref 12.0–16.0)
Lymphocytes Relative: 32 %
Lymphs Abs: 1.3 10*3/uL (ref 1.0–3.6)
MCH: 30 pg (ref 26.0–34.0)
MCHC: 34 g/dL (ref 32.0–36.0)
MCV: 88.3 fL (ref 80.0–100.0)
MONO ABS: 0.3 10*3/uL (ref 0.2–0.9)
Monocytes Relative: 8 %
NEUTROS ABS: 2.2 10*3/uL (ref 1.4–6.5)
NEUTROS PCT: 53 %
Platelets: 229 10*3/uL (ref 150–440)
RBC: 3.9 MIL/uL (ref 3.80–5.20)
RDW: 14.1 % (ref 11.5–14.5)
WBC: 4.1 10*3/uL (ref 3.6–11.0)

## 2017-09-03 LAB — COMPREHENSIVE METABOLIC PANEL
ALK PHOS: 61 U/L (ref 38–126)
ALT: 13 U/L — AB (ref 14–54)
ANION GAP: 5 (ref 5–15)
AST: 17 U/L (ref 15–41)
Albumin: 3.7 g/dL (ref 3.5–5.0)
BILIRUBIN TOTAL: 0.8 mg/dL (ref 0.3–1.2)
BUN: 12 mg/dL (ref 6–20)
CALCIUM: 8.7 mg/dL — AB (ref 8.9–10.3)
CO2: 26 mmol/L (ref 22–32)
CREATININE: 0.76 mg/dL (ref 0.44–1.00)
Chloride: 105 mmol/L (ref 101–111)
GFR calc non Af Amer: >60 mL/min (ref >60)
Glucose, Bld: 80 mg/dL (ref 65–99)
Potassium: 3.5 mmol/L (ref 3.5–5.1)
Sodium: 136 mmol/L (ref 135–145)
TOTAL PROTEIN: 7.3 g/dL (ref 6.5–8.1)

## 2017-09-03 MED ORDER — NILOTINIB HCL 150 MG PO CAPS: 300.0000 mg | ORAL_CAPSULE | Freq: Two times a day (BID) | ORAL | 2 refills | Status: DC

## 2017-09-03 NOTE — Progress Notes (Signed)
Patient here today for follow up. No concerns voiced.  °

## 2017-09-03 NOTE — Progress Notes (Signed)
Hematology/Oncology follow up note [facility] Telephone:([phone] Fax:([phone]   Patient Care Team: McLean-Scocuzza, Nino Glow, MD as PCP - General (Internal Medicine)  REFERRING PROVIDER: Previous oncologist from [place] Dr.Hasan Rizvi CHIEF COMPLAINTS/PURPOSE OF CONSULTATION:  Management of CML  HISTORY OF PRESENTING ILLNESS:  Jennifer English is a  52 y.o.  female with PMH listed below who was referred to by her oncologist in [place]. Patient relocated from [place] to [place] this summer. She has not had local pcp yet. Extensive medical records review was performed.  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 She had dyuria symptoms at that time and lab work up found extreme leukocytosis with WBC 173,500 and she was initially started on hydrea. After CML was confirmed, she was started on Tasigna 300mg  BID since then and has achieved remission. . Also had splenomegaly at presentation about 5-6 cm below LCM, resolved. . She reports being complaint on nilotinib however, she has had period of time when her medication runs out. Although she moved this summer, due to lot of stresses associated with relocation and new job, she waited until now see hematologist.  Denies any fever, chill, weight loss, bleeding events. She used to have EKG done every 3 months and faxed to oncologist. She has not had any EKG done after July this year. No PCP Also reports recently feeling urine has odor and also very mild burning sensation when passing urine. She requests to have urine tested.  Has hypothyroidism, not taking thyroid supplements.  History of iron deficiency due to heavy menstrual periods/uterine fibroids, taking iron supplements.  # 10/09/2012, bone marrow aspirate smears and clots: Markedly hypercellular marrow with prominent myeloid hyperplasia and increasing small hypolobated megakaryocytes. The overall findings are consistent with chronic myeloid leukemia, chronic  phase. # 03/22/2015 BCR ABL quantification PCR showed e13a2 (b2a2 p210) transcript <0.001%,e14a2 ( b3a2, p210)  transcript 0.023%, e1a2 (p190) transcript <0.001% # 08/17/2015 BCR ABL quantification PCR showed e13a2 (b2a2 p210) transcript 0.011%,e14a2 ( b3a2, p210)  transcript <0.001%, e1a2 (p190) transcript <0.001%  # 04/25/2016 BCR ABL quantification PCR showed e13a2 (b2a2 p210) transcript <0.001%,e14a2 ( b3a2, p210)  transcript <0.001%, e1a2 (p190) transcript <0.001%  #09/17/2016  BCR ABL quantification PCR showed e13a2 (b2a2 p210) transcript <0.0032%,e14a2 ( b3a2, p210)  transcript <0.0032%, e1a2 (p190) transcript <0.0032%   02/19/2017 BCR ABL quantification PCR showed b2a2 transcript <0.0032%, b3a2 transcript <0.0032%, E1A2 transcript <0.0032%  INTERVAL HISTORY Jennifer English is a 52 y.o. female who has above history reviewed by me today for follow-up of management of CML. # CML, she takes Tasigna daily.  Denies any diarrhea, shortness of breath, chest pain.  Occasionally she will forget a dose or 2 doses  #Fatigue is chronic and stable. No fever, chills, cough, diarrhea, chest pain, abdominal pain.   Review of Systems  Constitutional: Negative for chills, fever, malaise/fatigue and weight loss.  HENT: Negative for congestion, ear discharge, ear pain, hearing loss, nosebleeds, sinus pain, sore throat and tinnitus.   Eyes: Negative for blurred vision, double vision, photophobia, pain, discharge and redness.  Respiratory: Negative for cough, hemoptysis, sputum production, shortness of breath and wheezing.   Cardiovascular: Negative for chest pain, palpitations, orthopnea, claudication and leg swelling.  Gastrointestinal: Negative for abdominal pain, blood in stool, constipation, diarrhea, heartburn, melena, nausea and vomiting.  Genitourinary: Negative for dysuria, flank pain, frequency and hematuria.  Musculoskeletal: Negative for back pain, myalgias and neck pain.  Skin: Negative for  itching and rash.  Neurological: Negative for dizziness, tingling, tremors, focal weakness, weakness and headaches.  Endo/Heme/Allergies: Negative for environmental allergies. Does not bruise/bleed easily.  Psychiatric/Behavioral: Negative for depression, hallucinations and suicidal ideas. The patient is not nervous/anxious.     MEDICAL HISTORY:  Past Medical History:  Diagnosis Date  . Allergy   . CML (chronic myelocytic leukemia) ([place])   . Fibroids   . Heart murmur   . Thyroid disease    h/o thyroid d/o not on meds   . Uterine fibroid   . UTI (urinary tract infection)    x2   . 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 CESAREAN SECTION     x 1 2003   . CYSTECTOMY     middle finger right hand   . OTHER SURGICAL HISTORY     cyst removed from finger  . TONSILECTOMY, ADENOIDECTOMY, BILATERAL MYRINGOTOMY AND TUBES    . TYMPANOSTOMY TUBE PLACEMENT     1978    SOCIAL HISTORY: Social History   Socioeconomic History  . Marital status: Married    Spouse name: Not on file  . Number of children: Not on file  . Years of education: Not on file  . Highest education level: Not on file  Occupational History  . Not on file  Social Needs  . Financial resource strain: Not on file  . Food insecurity:    Worry: Not on file    Inability: Not on file  . Transportation needs:    Medical: Not on file    Non-medical: Not on file  Tobacco Use  . Smoking status: Never Smoker  . Smokeless tobacco: Never Used  Substance and Sexual Activity  . Alcohol use: No    Frequency: Never  . Drug use: No  . Sexual activity: Yes    Birth control/protection: None  Lifestyle  . Physical activity:    Days per week: Not on file    Minutes per session: Not on file  . Stress: Not on file  Relationships  . Social connections:    Talks on phone: Not on file    Gets together: Not on file    Attends religious  service: Not on file    Active member of club or organization: Not on file    Attends meetings of clubs or organizations: Not on file    Relationship status: Not on file  . Intimate partner violence:    Fear of current or ex partner: Not on file    Emotionally abused: Not on file    Physically abused: Not on file    Forced sexual activity: Not on file  Other Topics Concern  . Not on file  Social History Narrative   Married lives with daughter husband and mother in law   13 daughter    [employer] [place]   [place] from [place] 2018     FAMILY HISTORY: Family History  Problem Relation Age of Onset  . Heart disease Father        CAD with stents   . Hyperlipidemia Father   . Hypertension Father   . Dementia Maternal Grandfather   . Melanoma Paternal Grandmother   . Bladder Cancer Paternal Grandmother   . Cancer Paternal Grandmother        lung/colon ?primary   . Arthritis Paternal Grandmother   . Bladder Cancer Paternal  Grandfather   . Cancer Paternal Grandfather        bladder died in 45s   . Early death Paternal Grandfather   . Alcohol abuse Maternal Grandmother   . Hepatitis Maternal Grandmother   . Early death Maternal Grandmother   . Kidney disease Maternal Grandmother   . Macular degeneration Maternal Grandmother   . Liver disease Maternal Grandmother        ?alcoholic hepatitis   . Asthma Brother   . Asthma Brother   . Macular degeneration Maternal Aunt     ALLERGIES:  is allergic to ibuprofen; levoxyl [levothyroxine sodium]; synthroid [levothyroxine]; aspirin; avocado; eggs or egg-derived products; and penicillins.  MEDICATIONS:  Current Outpatient Medications  Medication Sig Dispense Refill  . ferrous sulfate 325 (65 FE) MG tablet Take 325 mg by mouth daily with breakfast.    . fexofenadine (ALLEGRA) 180 MG tablet Take 180 mg by mouth daily as needed for allergies or rhinitis.    . magnesium citrate SOLN Take 1 Bottle  by mouth once.    . nilotinib (TASIGNA) 150 MG capsule Take 2 capsules (300 mg total) by mouth 2 (two) times daily. Take on an empty stomach, 1 hour before or 2 hours after food. 120 capsule 2  . Ascorbic Acid (VITAMIN C) 100 MG tablet Take 100 mg by mouth daily.     No current facility-administered medications for this visit.      PHYSICAL EXAMINATION: ECOG PERFORMANCE STATUS: 0 - Asymptomatic Vitals:   09/03/17 1431 09/03/17 1439  BP: 118/80   Pulse: 71   Resp: 18   Temp:  (!) 97.4 F (36.3 C)   Filed Weights   09/03/17 1431  Weight: 188 lb 9.6 oz (85.5 kg)   Physical Exam  Constitutional: She is oriented to person, place, and time and well-developed, well-nourished, and in no distress. No distress.  HENT:  Head: Normocephalic.  Mouth/Throat: Oropharynx is clear and moist. No oropharyngeal exudate.  Eyes: Pupils are equal, round, and reactive to light. Conjunctivae and EOM are normal. No scleral icterus.  Neck: Normal range of motion. Neck supple.  Cardiovascular: Normal rate, regular rhythm and normal heart sounds. Exam reveals no gallop.  No murmur heard. Pulmonary/Chest: Effort normal and breath sounds normal. She has no wheezes. She has no rales.  Abdominal: Soft. Bowel sounds are normal. She exhibits no mass. There is no tenderness.  Musculoskeletal: Normal range of motion. She exhibits no edema.  Lymphadenopathy:    She has no cervical adenopathy.  Neurological: She is alert and oriented to person, place, and time. No cranial nerve deficit. Coordination normal.  Skin: Skin is warm and dry. No erythema.  Psychiatric: Affect and judgment normal.      LABORATORY DATA:  I have reviewed the data as listed Lab Results  Component Value Date   WBC 4.1 09/03/2017   HGB 11.7 (L) 09/03/2017   HCT 34.4 (L) 09/03/2017   MCV 88.3 09/03/2017   PLT 229 09/03/2017   Recent Labs    02/19/17 1123 05/22/17 1329 09/03/17 1412  NA 135 133* 136  K 3.9 3.9 3.5  CL 102 103  105  CO2 27 25 26   GLUCOSE 94 95 80  BUN 12 9 12   CREATININE 0.69 0.70 0.76  CALCIUM 8.8* 8.7* 8.7*  GFRNONAA >60 >60 >60  GFRAA >60 >60 >60  PROT 7.8 7.7 7.3  ALBUMIN 3.9 3.9 3.7  AST 19 19 17   ALT 16 15 13*  ALKPHOS 61 68 61  BILITOT 0.5 0.5 0.8       ASSESSMENT & PLAN:  1. CML (chronic myelocytic leukemia) (HCC)   #CML, PCR quantification of BCR ABL 3 months ago showed patient is in molecular remission.  Today's labs pending pending.  EKG was obtained during this visit and independently reviewed by me.  Normal QTC.  Continue Tasigna 300 mg twice daily.  Refill 35-month supply. Continue follow up with primary care physician.  Discussed with patient about contraceptive measure to avoid pregnancy while on Nilotinib.   All questions were answered. The patient knows to call the clinic with any problems questions or concerns. Return of visit: 3 months.     [name], MD, PhD Hematology Oncology [facility] at [facility] Pager- [phone] 09/03/2017

## 2017-09-09 LAB — BCR-ABL1, CML/ALL, PCR, QUANT

## 2017-09-11 ENCOUNTER — Ambulatory Visit: Payer: BLUE CROSS/BLUE SHIELD | Admitting: Internal Medicine

## 2017-09-11 DIAGNOSIS — Z0289 Encounter for other administrative examinations: Secondary | ICD-10-CM

## 2017-12-04 ENCOUNTER — Encounter: Payer: Self-pay | Admitting: Oncology

## 2017-12-04 ENCOUNTER — Other Ambulatory Visit: Payer: Self-pay

## 2017-12-04 ENCOUNTER — Inpatient Hospital Stay: Payer: BLUE CROSS/BLUE SHIELD | Admitting: Oncology

## 2017-12-04 ENCOUNTER — Inpatient Hospital Stay: Payer: BLUE CROSS/BLUE SHIELD | Attending: Oncology

## 2017-12-04 ENCOUNTER — Telehealth: Payer: Self-pay | Admitting: *Deleted

## 2017-12-04 VITALS — BP 138/76 | HR 87 | Temp 97.6°F | Resp 18 | Wt 187.7 lb

## 2017-12-04 DIAGNOSIS — L309 Dermatitis, unspecified: Secondary | ICD-10-CM | POA: Diagnosis not present

## 2017-12-04 DIAGNOSIS — R21 Rash and other nonspecific skin eruption: Secondary | ICD-10-CM

## 2017-12-04 DIAGNOSIS — R5382 Chronic fatigue, unspecified: Secondary | ICD-10-CM | POA: Diagnosis not present

## 2017-12-04 DIAGNOSIS — C9210 Chronic myeloid leukemia, BCR/ABL-positive, not having achieved remission: Secondary | ICD-10-CM

## 2017-12-04 LAB — CBC WITH DIFFERENTIAL/PLATELET
Basophils Absolute: 0 10*3/uL (ref 0–0.1)
Basophils Relative: 1 %
Eosinophils Absolute: 0.2 10*3/uL (ref 0–0.7)
Eosinophils Relative: 5 %
HCT: 37.8 % (ref 35.0–47.0)
Hemoglobin: 12.7 g/dL (ref 12.0–16.0)
Lymphocytes Relative: 34 %
Lymphs Abs: 1.5 10*3/uL (ref 1.0–3.6)
MCH: 29.7 pg (ref 26.0–34.0)
MCHC: 33.6 g/dL (ref 32.0–36.0)
MCV: 88.7 fL (ref 80.0–100.0)
Monocytes Absolute: 0.3 10*3/uL (ref 0.2–0.9)
Monocytes Relative: 7 %
Neutro Abs: 2.4 10*3/uL (ref 1.4–6.5)
Neutrophils Relative %: 53 %
Platelets: 250 10*3/uL (ref 150–440)
RBC: 4.26 MIL/uL (ref 3.80–5.20)
RDW: 14.9 % — ABNORMAL HIGH (ref 11.5–14.5)
WBC: 4.5 10*3/uL (ref 3.6–11.0)

## 2017-12-04 LAB — COMPREHENSIVE METABOLIC PANEL
ALT: 19 U/L (ref 0–44)
AST: 20 U/L (ref 15–41)
Albumin: 4.1 g/dL (ref 3.5–5.0)
Alkaline Phosphatase: 78 U/L (ref 38–126)
Anion gap: 5 (ref 5–15)
BUN: 14 mg/dL (ref 6–20)
CO2: 30 mmol/L (ref 22–32)
Calcium: 9.2 mg/dL (ref 8.9–10.3)
Chloride: 102 mmol/L (ref 98–111)
Creatinine, Ser: 0.92 mg/dL (ref 0.44–1.00)
GFR calc Af Amer: >60 mL/min (ref >60)
GFR calc non Af Amer: >60 mL/min (ref >60)
Glucose, Bld: 106 mg/dL — ABNORMAL HIGH (ref 70–99)
Potassium: 4 mmol/L (ref 3.5–5.1)
Sodium: 137 mmol/L (ref 135–145)
Total Bilirubin: 0.8 mg/dL (ref 0.3–1.2)
Total Protein: 7.9 g/dL (ref 6.5–8.1)

## 2017-12-04 MED ORDER — NILOTINIB HCL 150 MG PO CAPS: 300.0000 mg | ORAL_CAPSULE | Freq: Two times a day (BID) | ORAL | 2 refills | Status: DC

## 2017-12-04 NOTE — Telephone Encounter (Signed)
Patient called to say the prescriptions sent today went to the wrong pharmacy they should have gone to [facility].

## 2017-12-04 NOTE — Progress Notes (Signed)
Patient here for follow up. Pt c/o having increased headaches- daily or every other day. She states she has rash to her back. She is having vision problems. Increased joint pain, especially right foot. Pt states "I just don't feel like myself". PT has rash to her back that she would like for Dr. Tasia Catchings to look at.

## 2017-12-04 NOTE — Progress Notes (Signed)
Hematology/Oncology follow up note [facility] Telephone:(336) [phone] Fax:(336) [phone]   Patient Care Team: McLean-Scocuzza, Nino Glow, MD as PCP - General (Internal Medicine)  REFERRING PROVIDER: Previous oncologist from [place] Dr.Hasan Rizvi CHIEF COMPLAINTS/PURPOSE OF CONSULTATION:  Management of CML  HISTORY OF PRESENTING ILLNESS:  Jennifer English is a  52 y.o.  female with PMH listed below who was referred to by her oncologist in [place]. Patient relocated from [place] to [place] this summer. She has not had local pcp yet. Extensive medical records review was performed.  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 She had dyuria symptoms at that time and lab work up found extreme leukocytosis with WBC 173,500 and she was initially started on hydrea. After CML was confirmed, she was started on Tasigna 300mg  BID since then and has achieved remission. . Also had splenomegaly at presentation about 5-6 cm below LCM, resolved. . She reports being complaint on nilotinib however, she has had period of time when her medication runs out. Although she moved this summer, due to lot of stresses associated with relocation and new job, she waited until now see hematologist.  Denies any fever, chill, weight loss, bleeding events. She used to have EKG done every 3 months and faxed to oncologist. She has not had any EKG done after July this year. No PCP Also reports recently feeling urine has odor and also very mild burning sensation when passing urine. She requests to have urine tested.  Has hypothyroidism, not taking thyroid supplements.  History of iron deficiency due to heavy menstrual periods/uterine fibroids, taking iron supplements.  # 10/09/2012, bone marrow aspirate smears and clots: Markedly hypercellular marrow with prominent myeloid hyperplasia and increasing small hypolobated megakaryocytes. The overall findings are consistent with chronic myeloid leukemia, chronic  phase. # 03/22/2015 BCR ABL quantification PCR showed e13a2 (b2a2 p210) transcript <0.001%,e14a2 ( b3a2, p210)  transcript 0.023%, e1a2 (p190) transcript <0.001% # 08/17/2015 BCR ABL quantification PCR showed e13a2 (b2a2 p210) transcript 0.011%,e14a2 ( b3a2, p210)  transcript <0.001%, e1a2 (p190) transcript <0.001%  # 04/25/2016 BCR ABL quantification PCR showed e13a2 (b2a2 p210) transcript <0.001%,e14a2 ( b3a2, p210)  transcript <0.001%, e1a2 (p190) transcript <0.001%  #09/17/2016  BCR ABL quantification PCR showed e13a2 (b2a2 p210) transcript <0.0032%,e14a2 ( b3a2, p210)  transcript <0.0032%, e1a2 (p190) transcript <0.0032%   02/19/2017 BCR ABL quantification PCR showed b2a2 transcript <0.0032%, b3a2 transcript <0.0032%, E1A2 transcript <0.0032%  INTERVAL HISTORY Jennifer English is a 52 y.o. female who has above history reviewed by me today presents for follow up of treatment of CML.   # CML, she [place] daily, compliant for most of the times, she forgets sometimes and misses doses.  Denies any diarrhea, shortness of breath, chest pain,  # Fatigue, chronic, stable.  # Skin Rash, this is a new problem. She reports breaking out skin rash "here and there", rash is itchy, and she scratches a lot. No fever, chills, cough, diarrhea, chest pain, abdominal pain. No aggravating or alleviating factors.   Review of Systems  Constitutional: Negative for chills, fever, malaise/fatigue and weight loss.  HENT: Negative for congestion, ear discharge, ear pain, hearing loss, nosebleeds, sinus pain, sore throat and tinnitus.   Eyes: Negative for blurred vision, double vision, photophobia, pain, discharge and redness.  Respiratory: Negative for cough, hemoptysis, sputum production, shortness of breath and wheezing.   Cardiovascular: Negative for chest pain, palpitations, orthopnea, claudication and leg swelling.  Gastrointestinal: Negative for abdominal pain, blood in stool,  constipation, diarrhea,  heartburn, melena, nausea and vomiting.  Genitourinary: Negative for dysuria, flank pain, frequency and hematuria.  Musculoskeletal: Negative for back pain, myalgias and neck pain.  Skin: Negative for itching and rash.  Neurological: Negative for dizziness, tingling, tremors, focal weakness, weakness and headaches.  Endo/Heme/Allergies: Negative for environmental allergies. Does not bruise/bleed easily.  Psychiatric/Behavioral: Negative for depression, hallucinations and suicidal ideas. The patient is not nervous/anxious.     MEDICAL HISTORY:  Past Medical History:  Diagnosis Date  . Allergy   . CML (chronic myelocytic leukemia) ([place])   . Fibroids   . Heart murmur   . Thyroid disease    h/o thyroid d/o not on meds   . Uterine fibroid   . UTI (urinary tract infection)    x2   . 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 CESAREAN SECTION     x 1 2003   . CYSTECTOMY     middle finger right hand   . OTHER SURGICAL HISTORY     cyst removed from finger  . TONSILECTOMY, ADENOIDECTOMY, BILATERAL MYRINGOTOMY AND TUBES    . TYMPANOSTOMY TUBE PLACEMENT     1978    SOCIAL HISTORY: Social History   Socioeconomic History  . Marital status: Married    Spouse name: Not on file  . Number of children: Not on file  . Years of education: Not on file  . Highest education level: Not on file  Occupational History  . Not on file  Social Needs  . Financial resource strain: Not on file  . Food insecurity:    Worry: Not on file    Inability: Not on file  . Transportation needs:    Medical: Not on file    Non-medical: Not on file  Tobacco Use  . Smoking status: Never Smoker  . Smokeless tobacco: Never Used  Substance and Sexual Activity  . Alcohol use: No    Frequency: Never  . Drug use: No  . Sexual activity: Yes    Birth control/protection: None  Lifestyle  . Physical activity:    Days per  week: Not on file    Minutes per session: Not on file  . Stress: Not on file  Relationships  . Social connections:    Talks on phone: Not on file    Gets together: Not on file    Attends religious service: Not on file    Active member of club or organization: Not on file    Attends meetings of clubs or organizations: Not on file    Relationship status: Not on file  . Intimate partner violence:    Fear of current or ex partner: Not on file    Emotionally abused: Not on file    Physically abused: Not on file    Forced sexual activity: Not on file  Other Topics Concern  . Not on file  Social History Narrative   Married lives with daughter husband and mother in law   66 daughter    [employer] [place]   [place] from [place] 2018     FAMILY HISTORY: Family History  Problem Relation Age of Onset  . Heart disease Father        CAD with stents   . Hyperlipidemia Father   . Hypertension Father   . Dementia Maternal Grandfather   . Melanoma  Paternal Grandmother   . Bladder Cancer Paternal Grandmother   . Cancer Paternal Grandmother        lung/colon ?primary   . Arthritis Paternal Grandmother   . Bladder Cancer Paternal Grandfather   . Cancer Paternal Grandfather        bladder died in 39s   . Early death Paternal Grandfather   . Alcohol abuse Maternal Grandmother   . Hepatitis Maternal Grandmother   . Early death Maternal Grandmother   . Kidney disease Maternal Grandmother   . Macular degeneration Maternal Grandmother   . Liver disease Maternal Grandmother        ?alcoholic hepatitis   . Asthma Brother   . Asthma Brother   . Macular degeneration Maternal Aunt     ALLERGIES:  is allergic to ibuprofen; levoxyl [levothyroxine sodium]; synthroid [levothyroxine]; aspirin; avocado; eggs or egg-derived products; and penicillins.  MEDICATIONS:  Current Outpatient Medications  Medication Sig Dispense Refill  . ferrous sulfate 325 (65 FE)  MG tablet Take 325 mg by mouth daily with breakfast.    . fexofenadine (ALLEGRA) 180 MG tablet Take 180 mg by mouth daily as needed for allergies or rhinitis.    . nilotinib (TASIGNA) 150 MG capsule Take 2 capsules (300 mg total) by mouth 2 (two) times daily. Take on an empty stomach, 1 hour before or 2 hours after food. 120 capsule 2  . Ascorbic Acid (VITAMIN C) 100 MG tablet Take 100 mg by mouth daily.    . magnesium citrate SOLN Take 1 Bottle by mouth once.     No current facility-administered medications for this visit.      PHYSICAL EXAMINATION: ECOG PERFORMANCE STATUS: 0 - Asymptomatic Vitals:   12/04/17 1341  BP: 138/76  Pulse: 87  Resp: 18  Temp: 97.6 F (36.4 C)  SpO2: 97%   Filed Weights   12/04/17 1341  Weight: 187 lb 11.2 oz (85.1 kg)   Physical Exam  Constitutional: She is oriented to person, place, and time and well-developed, well-nourished, and in no distress. No distress.  HENT:  Head: Normocephalic and atraumatic.  Nose: Nose normal.  Mouth/Throat: Oropharynx is clear and moist. No oropharyngeal exudate.  Eyes: Pupils are equal, round, and reactive to light. Conjunctivae and EOM are normal. Left eye exhibits no discharge. No scleral icterus.  Neck: Normal range of motion. Neck supple.  Cardiovascular: Normal rate, regular rhythm and normal heart sounds. Exam reveals no gallop.  No murmur heard. Pulmonary/Chest: Effort normal and breath sounds normal. No respiratory distress. She has no wheezes. She has no rales. She exhibits no tenderness.  Abdominal: Soft. Bowel sounds are normal. She exhibits no distension and no mass. There is no tenderness.  Musculoskeletal: Normal range of motion. She exhibits no edema.  Lymphadenopathy:    She has no cervical adenopathy.  Neurological: She is alert and oriented to person, place, and time. No cranial nerve deficit. She exhibits normal muscle tone. Coordination normal.  Skin: Skin is warm and dry. Rash noted. She is not  diaphoretic. No erythema.  Psychiatric: Affect and judgment normal.        LABORATORY DATA:  I have reviewed the data as listed Lab Results  Component Value Date   WBC 4.5 12/04/2017   HGB 12.7 12/04/2017   HCT 37.8 12/04/2017   MCV 88.7 12/04/2017   PLT 250 12/04/2017   Recent Labs    05/22/17 1329 09/03/17 1412 12/04/17 1256  NA 133* 136 137  K 3.9 3.5 4.0  CL  103 105 102  CO2 25 26 30   GLUCOSE 95 80 106*  BUN 9 12 14   CREATININE 0.70 0.76 0.92  CALCIUM 8.7* 8.7* 9.2  GFRNONAA >60 >60 >60  GFRAA >60 >60 >60  PROT 7.7 7.3 7.9  ALBUMIN 3.9 3.7 4.1  AST 19 17 20   ALT 15 13* 19  ALKPHOS 68 61 78  BILITOT 0.5 0.8 0.8    # 09/03/2017  BCR-ABL1 PCR quantification: NEGATIVE for the BCR-ABL1 e1a2 (p190), e13a2 (b2a2, p210) and e14a2  (b3a2, p210) fusion transcripts   ASSESSMENT & PLAN:  1. CML (chronic myelocytic leukemia) ([place])   2. Skin rash   #CML,  EKG was performed during this visit and independantly reviewed by me. Normal QTc intervel.  Labs reviewed and discussed with patient. Stable disease.  Continue Tasigna 300mg  BID. Refill sent.  Continue follow up with primary care physician.  Discussed with patient about contraceptive measure to avoid pregnancy while on Nilotinib.   # Skin Rash: dermatitis. Recommend topical benadryl cream and topical corticosteroid cream.   All questions were answered. The patient knows to call the clinic with any problems questions or concerns. Return of visit: 3 months.  Total face to face encounter time for this patient visit was 25 min. >50% of the time was  spent in counseling and coordination of care.    [name], MD, PhD Hematology Oncology [facility] at [facility] Pager- [phone] 12/04/2017

## 2017-12-04 NOTE — Telephone Encounter (Signed)
Rx was sent to [facility]

## 2017-12-10 ENCOUNTER — Encounter: Payer: Self-pay | Admitting: Oncology

## 2017-12-12 ENCOUNTER — Ambulatory Visit: Payer: BLUE CROSS/BLUE SHIELD | Admitting: Internal Medicine

## 2017-12-12 ENCOUNTER — Encounter: Payer: Self-pay | Admitting: Internal Medicine

## 2017-12-12 VITALS — BP 120/86 | HR 91 | Temp 98.5°F | Ht 64.0 in | Wt 188.0 lb

## 2017-12-12 DIAGNOSIS — C9210 Chronic myeloid leukemia, BCR/ABL-positive, not having achieved remission: Secondary | ICD-10-CM

## 2017-12-12 DIAGNOSIS — B354 Tinea corporis: Secondary | ICD-10-CM | POA: Diagnosis not present

## 2017-12-12 DIAGNOSIS — R232 Flushing: Secondary | ICD-10-CM | POA: Diagnosis not present

## 2017-12-12 MED ORDER — KETOCONAZOLE 2 % EX CREA: TOPICAL_CREAM | CUTANEOUS | 0 refills | Status: DC

## 2017-12-12 MED ORDER — HYDROCORTISONE 2.5 % EX CREA: TOPICAL_CREAM | Freq: Two times a day (BID) | CUTANEOUS | 0 refills | Status: DC

## 2017-12-12 NOTE — Patient Instructions (Addendum)
Use creams 2-3 x per day x 1-3 weeks   Body Ringworm Body ringworm is an infection of the skin that often causes a ring-shaped rash. Body ringworm can affect any part of your skin. It can spread easily to others. Body ringworm is also called tinea corporis. What are the causes? This condition is caused by funguses called dermatophytes. The condition develops when these funguses grow out of control on the skin. You can get this condition if you touch a person or animal that has it. You can also get it if you share clothing, bedding, towels, or any other object with an infected person or pet. What increases the risk? This condition is more likely to develop in:  Athletes who often make skin-to-skin contact with other athletes, such as wrestlers.  People who share equipment and mats.  People with a weakened immune system.  What are the signs or symptoms? Symptoms of this condition include:  Itchy, raised red spots and bumps.  Red scaly patches.  A ring-shaped rash. The rash may have: ? A clear center. ? Scales or red bumps at its center. ? Redness near its borders. ? Dry and scaly skin on or around it.  How is this diagnosed? This condition can usually be diagnosed with a skin exam. A skin scraping may be taken from the affected area and examined under a microscope to see if the fungus is present. How is this treated? This condition may be treated with:  An antifungal cream or ointment.  An antifungal shampoo.  Antifungal medicines. These may be prescribed if your ringworm is severe, keeps coming back, or lasts a long time.  Follow these instructions at home:  Take over-the-counter and prescription medicines only as told by your health care provider.  If you were given an antifungal cream or ointment: ? Use it as told by your health care provider. ? Wash the infected area and dry it completely before applying the cream or ointment.  If you were given an antifungal  shampoo: ? Use it as told by your health care provider. ? Leave the shampoo on your body for 3-5 minutes before rinsing.  While you have a rash: ? Wear loose clothing to stop clothes from rubbing and irritating it. ? Wash or change your bed sheets every night.  If your pet has the same infection, take your pet to see a Animal nutritionist. How is this prevented?  Practice good hygiene.  Wear sandals or shoes in public places and showers.  Do not share personal items with others.  Avoid touching red patches of skin on other people.  Avoid touching pets that have bald spots.  If you touch an animal that has a bald spot, wash your hands. Contact a health care provider if:  Your rash continues to spread after 7 days of treatment.  Your rash is not gone in 4 weeks.  The area around your rash gets red, warm, tender, and swollen. This information is not intended to replace advice given to you by your health care provider. Make sure you discuss any questions you have with your health care provider. Document Released: 03/08/2000 Document Revised: 08/17/2015 Document Reviewed: 01/05/2015 Elsevier Interactive Patient Education  [employer].

## 2017-12-12 NOTE — Progress Notes (Signed)
Chief Complaint  Patient presents with  . Rash    1. Rash She reports 3-4 months ago she had left arm rash which was itchy and resolved now she has had rash on right lower back x 3 weeks which is getting bigger in size over 3 weeks dry and scaly. She tried benadryl for itching w/o help. She reports she had hives 04/2017 which are now resolved. She tried Allegra for itching w/o help. She reports as a kid she would get rash behind her knees as well.   2. She reports having hot flashes and cycle has not been on x 3 months   Review of Systems  Constitutional: Negative for weight loss.  Skin: Positive for itching and rash.  Psychiatric/Behavioral: The patient is nervous/anxious.    Past Medical History:  Diagnosis Date  . Allergy   . CML (chronic myelocytic leukemia) ([place])   . Fibroids   . Heart murmur   . Thyroid disease    h/o thyroid d/o not on meds   . Uterine fibroid   . UTI (urinary tract infection)    x2   . 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 CESAREAN SECTION     x 1 2003   . CYSTECTOMY     middle finger right hand   . OTHER SURGICAL HISTORY     cyst removed from finger  . TONSILECTOMY, ADENOIDECTOMY, BILATERAL MYRINGOTOMY AND TUBES    . TYMPANOSTOMY TUBE PLACEMENT     1978   Family History  Problem Relation Age of Onset  . Heart disease Father        CAD with stents   . Hyperlipidemia Father   . Hypertension Father   . Dementia Maternal Grandfather   . Melanoma Paternal Grandmother   . Bladder Cancer Paternal Grandmother   . Cancer Paternal Grandmother        lung/colon ?primary   . Arthritis Paternal Grandmother   . Bladder Cancer Paternal Grandfather   . Cancer Paternal Grandfather        bladder died in 79s   . Early death Paternal Grandfather   . Alcohol abuse Maternal Grandmother   . Hepatitis Maternal Grandmother   . Early death Maternal Grandmother   . Kidney disease Maternal  Grandmother   . Macular degeneration Maternal Grandmother   . Liver disease Maternal Grandmother        ?alcoholic hepatitis   . Asthma Brother   . Asthma Brother   . Macular degeneration Maternal Aunt    Social History   Socioeconomic History  . Marital status: Married    Spouse name: Not on file  . Number of children: Not on file  . Years of education: Not on file  . Highest education level: Not on file  Occupational History  . Not on file  Social Needs  . Financial resource strain: Not on file  . Food insecurity:    Worry: Not on file    Inability: Not on file  . Transportation needs:    Medical: Not on file    Non-medical: Not on file  Tobacco Use  . Smoking status: Never Smoker  . Smokeless tobacco: Never Used  Substance and Sexual Activity  . Alcohol use: No    Frequency: Never  . Drug use: No  . Sexual activity: Yes    Birth control/protection: None  Lifestyle  . Physical activity:  Days per week: Not on file    Minutes per session: Not on file  . Stress: Not on file  Relationships  . Social connections:    Talks on phone: Not on file    Gets together: Not on file    Attends religious service: Not on file    Active member of club or organization: Not on file    Attends meetings of clubs or organizations: Not on file    Relationship status: Not on file  . Intimate partner violence:    Fear of current or ex partner: Not on file    Emotionally abused: Not on file    Physically abused: Not on file    Forced sexual activity: Not on file  Other Topics Concern  . 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 Ascorbic Acid (VITAMIN C) 100 MG tablet Take 100 mg by mouth daily.  . ferrous sulfate 325 (65 FE) MG tablet Take 325 mg by mouth daily with breakfast.  .  fexofenadine (ALLEGRA) 180 MG tablet Take 180 mg by mouth daily as needed for allergies or rhinitis.  . magnesium citrate SOLN Take 1 Bottle by mouth once.  . nilotinib (TASIGNA) 150 MG capsule Take 2 capsules (300 mg total) by mouth 2 (two) times daily. Take on an empty stomach, 1 hour before or 2 hours after food.   Allergies  Allergen Reactions  . Ibuprofen   . Levoxyl [Levothyroxine Sodium]     Rash    . Synthroid [Levothyroxine]     rash  . Aspirin Rash  . Avocado Rash  . Eggs Or Egg-Derived Products Rash  . Penicillins Rash   Recent Results (from the past 2160 hour(s))  Comprehensive metabolic panel     Status: Abnormal   Collection Time: 12/04/17 12:56 PM  Result Value Ref Range   Sodium 137 135 - 145 mmol/L   Potassium 4.0 3.5 - 5.1 mmol/L   Chloride 102 98 - 111 mmol/L   CO2 30 22 - 32 mmol/L   Glucose, Bld 106 (H) 70 - 99 mg/dL   BUN 14 6 - 20 mg/dL   Creatinine, Ser 0.92 0.44 - 1.00 mg/dL   Calcium 9.2 8.9 - 10.3 mg/dL   Total Protein 7.9 6.5 - 8.1 g/dL   Albumin 4.1 3.5 - 5.0 g/dL   AST 20 15 - 41 U/L   ALT 19 0 - 44 U/L   Alkaline Phosphatase 78 38 - 126 U/L   Total Bilirubin 0.8 0.3 - 1.2 mg/dL   GFR calc non Af Amer >60 >60 mL/min   GFR calc Af Amer >60 >60 mL/min    Comment: (NOTE) The eGFR has been calculated using the CKD EPI equation. This calculation has not been validated in all clinical situations. eGFR's persistently <60 mL/min signify possible Chronic Kidney Disease.    Anion gap 5 5 - 15    Comment: Performed at [facility], [address] [place], [place] [address]  CBC with Differential/Platelet     Status: Abnormal   Collection Time: 12/04/17 12:56 PM  Result Value Ref Range   WBC 4.5 3.6 - 11.0 K/uL   RBC 4.26 3.80 - 5.20 MIL/uL   Hemoglobin 12.7 12.0 -  16.0 g/dL   HCT 37.8 35.0 - 47.0 %   MCV 88.7 80.0 - 100.0 fL   MCH 29.7 26.0 - 34.0 pg   MCHC 33.6 32.0 - 36.0 g/dL   RDW 14.9 (H) 11.5 - 14.5 %   Platelets 250 150 - 440  K/uL   Neutrophils Relative % 53 %   Neutro Abs 2.4 1.4 - 6.5 K/uL   Lymphocytes Relative 34 %   Lymphs Abs 1.5 1.0 - 3.6 K/uL   Monocytes Relative 7 %   Monocytes Absolute 0.3 0.2 - 0.9 K/uL   Eosinophils Relative 5 %   Eosinophils Absolute 0.2 0 - 0.7 K/uL   Basophils Relative 1 %   Basophils Absolute 0.0 0 - 0.1 K/uL    Comment: Performed at [facility], [address] [place], [place] [address]   Objective  Body mass index is 32.27 kg/m. Wt Readings from Last 3 Encounters:  12/12/17 188 lb (85.3 kg)  12/04/17 187 lb 11.2 oz (85.1 kg)  09/03/17 188 lb 9.6 oz (85.5 kg)   Temp Readings from Last 3 Encounters:  12/12/17 98.5 F (36.9 C) (Oral)  12/04/17 97.6 F (36.4 C) (Tympanic)  09/03/17 (!) 97.4 F (36.3 C) (Tympanic)   BP Readings from Last 3 Encounters:  12/12/17 120/86  12/04/17 138/76  09/03/17 118/80   Pulse Readings from Last 3 Encounters:  12/12/17 91  12/04/17 87  09/03/17 71    Physical Exam  Constitutional: She is oriented to person, place, and time. Vital signs are normal. She appears well-developed and well-nourished. She is cooperative.  HENT:  Head: Normocephalic and atraumatic.  Mouth/Throat: Oropharynx is clear and moist and mucous membranes are normal.  Eyes: Pupils are equal, round, and reactive to light. Conjunctivae are normal.  Cardiovascular: Normal rate, regular rhythm and normal heart sounds.  Pulmonary/Chest: Effort normal and breath sounds normal.  Neurological: She is alert and oriented to person, place, and time. Gait normal.  Skin: Skin is warm and dry. Rash noted.     Psychiatric: She has a normal mood and affect. Her speech is normal and behavior is normal. Judgment and thought content normal. Cognition and memory are normal.  Nursing note and vitals reviewed.   Assessment   1. Rash right back c/w tinea corporis  2. CML  3. Hot flashes and weight gain and amenorrhea x 3 months menopause in differential  Also h/o  DUB with h/o fibroids  4. HM Plan   1. Trial of ketaconazole 2 % tid with hc 2.5 % prn  Avoid diflucan with DDI with Tasigna  If does not improve refer to dermatology  2. Just saw H/o recently  3. She did not previously have [place] as ordered to w/u fibroids  4.  Declines flu shot  Pt declines vaccines she does not normally take vaccines   Per pt last pap 2014/2015 no h/o abnormal  -need to get records [facility] last pap/mammo and also records Dr. Jerl Mina in [place] (labs) signed release today -pap 05/09/11 neg pap neg HPV -reviewed records other Dr. Jerl Mina scanned into chart impt noted below:  hosp 2014 extreme leukocytosis in [place] had CT ab/pelvis 7/2065mld right hydronephrosis and hydoureter 3 mm calcification calculus or phlebolith, 8 cm calcified uterine fibroid   Consider colonoscopy age 89 Mammogram last had 2014 per pt last record states 2008 referred 07/03/17  -pt is not sure when last mammo was, no mammo sch as of yet  Consider check  lipid (per pt had end 2017) , hep B, MMR status in future  Last had skin check 2 years ago consider in future   Provider: Dr. Olivia Mackie McLean-Scocuzza-Internal Medicine

## 2017-12-12 NOTE — Progress Notes (Signed)
Pre visit review using our clinic review tool, if applicable. No additional management support is needed unless otherwise documented below in the visit note. 

## 2017-12-25 ENCOUNTER — Ambulatory Visit (INDEPENDENT_AMBULATORY_CARE_PROVIDER_SITE_OTHER): Payer: BLUE CROSS/BLUE SHIELD

## 2017-12-25 ENCOUNTER — Encounter: Payer: Self-pay | Admitting: Internal Medicine

## 2017-12-25 ENCOUNTER — Ambulatory Visit: Payer: BLUE CROSS/BLUE SHIELD | Admitting: Internal Medicine

## 2017-12-25 VITALS — BP 134/70 | HR 77 | Temp 98.5°F | Ht 64.0 in | Wt 187.6 lb

## 2017-12-25 DIAGNOSIS — M79641 Pain in right hand: Secondary | ICD-10-CM | POA: Diagnosis not present

## 2017-12-25 DIAGNOSIS — M25531 Pain in right wrist: Secondary | ICD-10-CM

## 2017-12-25 DIAGNOSIS — B354 Tinea corporis: Secondary | ICD-10-CM | POA: Diagnosis not present

## 2017-12-25 DIAGNOSIS — M7711 Lateral epicondylitis, right elbow: Secondary | ICD-10-CM | POA: Diagnosis not present

## 2017-12-25 MED ORDER — KETOCONAZOLE 2 % EX CREA: TOPICAL_CREAM | CUTANEOUS | 11 refills | Status: DC

## 2017-12-25 NOTE — Progress Notes (Signed)
Chief Complaint  Patient presents with  . Arm Pain   Acute visit  1. C/o right hand, wrist pain and lateral forearm pain x 2-3 days with limited ROM. No h/o trauma. Strength is decreased yesterday worse and unable to hold pen or count money and pain was 10/10 now 5/10. No numbness or tingling. Tried wrist brace and biofreeze with some relief. Husband grabbed her forearm which hurt when he touched it lightly. Right wrist was also swollen she does have h/o ganglion cyst removed one of fingers right hand   2. Tinea corporis right lower back improved with topical nizoral cream after 3 days using 2-3 x per day though still there but improved. She reports recently horseback riding and white water rafting in [place] on vactaion   Review of Systems  Constitutional: Negative for weight loss.  HENT: Negative for hearing loss.   Eyes: Negative for blurred vision.  Respiratory: Negative for shortness of breath.   Cardiovascular: Negative for chest pain.  Musculoskeletal: Positive for joint pain.  Skin: Positive for rash.  Neurological: Negative for headaches.  Psychiatric/Behavioral: Negative for depression.   Past Medical History:  Diagnosis Date  . Allergy   . CML (chronic myelocytic leukemia) ([place])   . Fibroids   . Heart murmur   . Thyroid disease    h/o thyroid d/o not on meds   . Uterine fibroid   . UTI (urinary tract infection)    x2   . 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 CESAREAN SECTION     x 1 2003   . CYSTECTOMY     middle finger right hand   . OTHER SURGICAL HISTORY     cyst removed from finger  . TONSILECTOMY, ADENOIDECTOMY, BILATERAL MYRINGOTOMY AND TUBES    . TYMPANOSTOMY TUBE PLACEMENT     1978   Family History  Problem Relation Age of Onset  . Heart disease Father        CAD with stents   . Hyperlipidemia Father   . Hypertension Father   . Dementia Maternal Grandfather   . Melanoma  Paternal Grandmother   . Bladder Cancer Paternal Grandmother   . Cancer Paternal Grandmother        lung/colon ?primary   . Arthritis Paternal Grandmother   . Bladder Cancer Paternal Grandfather   . Cancer Paternal Grandfather        bladder died in 35s   . Early death Paternal Grandfather   . Alcohol abuse Maternal Grandmother   . Hepatitis Maternal Grandmother   . Early death Maternal Grandmother   . Kidney disease Maternal Grandmother   . Macular degeneration Maternal Grandmother   . Liver disease Maternal Grandmother        ?alcoholic hepatitis   . Asthma Brother   . Asthma Brother   . Macular degeneration Maternal Aunt    Social History   Socioeconomic History  . Marital status: Married    Spouse name: Not on file  . Number of children: Not on file  . Years of education: Not on file  . Highest education level: Not on file  Occupational History  . Not on file  Social Needs  . Financial resource strain: Not on file  . Food insecurity:    Worry: Not on file    Inability: Not on file  . Transportation needs:    Medical: Not on file    Non-medical:  Not on file  Tobacco Use  . Smoking status: Never Smoker  . Smokeless tobacco: Never Used  Substance and Sexual Activity  . Alcohol use: No    Frequency: Never  . Drug use: No  . Sexual activity: Yes    Birth control/protection: None  Lifestyle  . Physical activity:    Days per week: Not on file    Minutes per session: Not on file  . Stress: Not on file  Relationships  . Social connections:    Talks on phone: Not on file    Gets together: Not on file    Attends religious service: Not on file    Active member of club or organization: Not on file    Attends meetings of clubs or organizations: Not on file    Relationship status: Not on file  . Intimate partner violence:    Fear of current or ex partner: Not on file    Emotionally abused: Not on file    Physically abused: Not on file    Forced sexual activity:  Not on file  Other Topics Concern  . 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 Ascorbic Acid (VITAMIN C) 100 MG tablet Take 100 mg by mouth daily.  . ferrous sulfate 325 (65 FE) MG tablet Take 325 mg by mouth daily with breakfast.  . fexofenadine (ALLEGRA) 180 MG tablet Take 180 mg by mouth daily as needed for allergies or rhinitis.  . hydrocortisone 2.5 % cream Apply topically 2 (two) times daily. Right back  . ketoconazole (NIZORAL) 2 % cream Use 2-3 x per day x 1-3 weeks  . magnesium citrate SOLN Take 1 Bottle by mouth once.  . nilotinib (TASIGNA) 150 MG capsule Take 2 capsules (300 mg total) by mouth 2 (two) times daily. Take on an empty stomach, 1 hour before or 2 hours after food.  . [DISCONTINUED] ketoconazole (NIZORAL) 2 % cream Use 2-3 x per day x 1-3 weeks   Allergies  Allergen Reactions  . Ibuprofen   . Levoxyl [Levothyroxine Sodium]     Rash    . Synthroid [Levothyroxine]     rash  . Aspirin Rash  . Avocado Rash  . Eggs Or Egg-Derived Products Rash  . Penicillins Rash   Recent Results (from the past 2160 hour(s))  Comprehensive metabolic panel     Status: Abnormal   Collection Time: 12/04/17 12:56 PM  Result Value Ref Range   Sodium 137 135 - 145 mmol/L   Potassium 4.0 3.5 - 5.1 mmol/L   Chloride 102 98 - 111 mmol/L   CO2 30 22 - 32 mmol/L   Glucose, Bld 106 (H) 70 - 99 mg/dL   BUN 14 6 - 20 mg/dL   Creatinine, Ser 0.92 0.44 - 1.00 mg/dL   Calcium 9.2 8.9 - 10.3 mg/dL   Total Protein 7.9 6.5 - 8.1 g/dL   Albumin 4.1 3.5 - 5.0 g/dL   AST 20 15 - 41 U/L   ALT 19 0 - 44 U/L   Alkaline Phosphatase 78 38 - 126 U/L   Total Bilirubin 0.8 0.3 - 1.2 mg/dL   GFR calc non Af Amer >60 >60 mL/min   GFR calc Af Amer >60 >60 mL/min  Comment: (NOTE) The eGFR has been calculated  using the CKD EPI equation. This calculation has not been validated in all clinical situations. eGFR's persistently <60 mL/min signify possible Chronic Kidney Disease.    Anion gap 5 5 - 15    Comment: Performed at [facility], [address] [place], [place] [address]  CBC with Differential/Platelet     Status: Abnormal   Collection Time: 12/04/17 12:56 PM  Result Value Ref Range   WBC 4.5 3.6 - 11.0 K/uL   RBC 4.26 3.80 - 5.20 MIL/uL   Hemoglobin 12.7 12.0 - 16.0 g/dL   HCT 37.8 35.0 - 47.0 %   MCV 88.7 80.0 - 100.0 fL   MCH 29.7 26.0 - 34.0 pg   MCHC 33.6 32.0 - 36.0 g/dL   RDW 14.9 (H) 11.5 - 14.5 %   Platelets 250 150 - 440 K/uL   Neutrophils Relative % 53 %   Neutro Abs 2.4 1.4 - 6.5 K/uL   Lymphocytes Relative 34 %   Lymphs Abs 1.5 1.0 - 3.6 K/uL   Monocytes Relative 7 %   Monocytes Absolute 0.3 0.2 - 0.9 K/uL   Eosinophils Relative 5 %   Eosinophils Absolute 0.2 0 - 0.7 K/uL   Basophils Relative 1 %   Basophils Absolute 0.0 0 - 0.1 K/uL    Comment: Performed at [facility], [address] [place], [place] [address]   Objective  Body mass index is 32.2 kg/m. Wt Readings from Last 3 Encounters:  12/25/17 187 lb 9.6 oz (85.1 kg)  12/12/17 188 lb (85.3 kg)  12/04/17 187 lb 11.2 oz (85.1 kg)   Temp Readings from Last 3 Encounters:  12/25/17 98.5 F (36.9 C) (Oral)  12/12/17 98.5 F (36.9 C) (Oral)  12/04/17 97.6 F (36.4 C) (Tympanic)   BP Readings from Last 3 Encounters:  12/25/17 134/70  12/12/17 120/86  12/04/17 138/76   Pulse Readings from Last 3 Encounters:  12/25/17 77  12/12/17 91  12/04/17 87    Physical Exam  Constitutional: She is oriented to person, place, and time. Vital signs are normal. She appears well-developed and well-nourished. She is cooperative.  HENT:  Head: Normocephalic and atraumatic.  Mouth/Throat: Oropharynx is clear and moist and mucous membranes are normal.  Eyes: Pupils are equal, round, and reactive to  light. Conjunctivae are normal.  Cardiovascular: Normal rate and regular rhythm.  Murmur heard. Pulmonary/Chest: Effort normal and breath sounds normal.  Musculoskeletal:       Right elbow: Tenderness found. Lateral epicondyle tenderness noted.       Right wrist: She exhibits tenderness. She exhibits normal range of motion.       Arms: Neurological: She is alert and oriented to person, place, and time. Gait normal.  Skin: Skin is warm and dry. Rash noted.     Psychiatric: She has a normal mood and affect. Her speech is normal and behavior is normal. Judgment and thought content normal. Cognition and memory are normal.  Nursing note and vitals reviewed.   Assessment   1. Right wrist hand and forearm pain ddx OA vs ganglion cyst vs lateral epicondylitis vs multifactorial  2. Tinea corporis  3. HM Plan  1. Xray right wrist and hand today  Try otc topicals Prn Tylenol  voltaren interferes with tasigna for CML  Try heat  Cont wrist brace and rx counter force brace  Consider MRI if not improving of right wrist  2. Refilled fungal cream 2-3x per day  Consider dermatology if not improving  3.  Declines flu shot  Pt declines vaccines she does not normally take vaccines   -pap 05/09/11 neg pap neg HPV rec pap again today  H/o fibroids prev disc pelvic US and TVUS to f/u has not had   Consider colonoscopy age 89 disc'ed pt today declines will consider cologaurd in future   Mammogram last had 2014per pt last record states 2008 referred 07/03/17  -disc again today no mammo sch as of yet today   Consider check lipid (per pt had end 2017) -not likely to check hep B, MMR status in future due to declines vaccines  Last had skin check 2 years agoconsider in future Provider: Dr. Olivia Mackie McLean-Scocuzza-Internal Medicine

## 2017-12-25 NOTE — Progress Notes (Signed)
Pre visit review using our clinic review tool, if applicable. No additional management support is needed unless otherwise documented below in the visit note. 

## 2017-12-25 NOTE — Patient Instructions (Addendum)
Consider Bengay, Tiger Balm or Aspercream or Salonpas otc topicals  Tylenol 500 mg up to 6 pills in 1 day   Hand Pain Many things can cause hand pain. Some common causes are:  An injury.  Repeating the same movement with your hand over and over (overuse).  Osteoporosis.  Arthritis.  Lumps in the tendons or joints of the hand and wrist (ganglion cysts).  Infection.  Follow these instructions at home: Pay attention to any changes in your symptoms. Take these actions to help with your discomfort:  If directed, put ice on the affected area: ? Put ice in a plastic bag. ? Place a towel between your skin and the bag. ? Leave the ice on for 15-20 minutes, 3?4 times a day for 2 days.  Take over-the-counter and prescription medicines only as told by your health care provider.  Minimize stress on your hands and wrists as much as possible.  Take breaks from repetitive activity often.  Do stretches as told by your health care provider.  Do not do activities that make your pain worse.  Contact a health care provider if:  Your pain does not get better after a few days of self-care.  Your pain gets worse.  Your pain affects your ability to do your daily activities. Get help right away if:  Your hand becomes warm, red, or swollen.  Your hand is numb or tingling.  Your hand is extremely swollen or deformed.  Your hand or fingers turn white or blue.  You cannot move your hand, wrist, or fingers. This information is not intended to replace advice given to you by your health care provider. Make sure you discuss any questions you have with your health care provider. Document Released: 04/07/2015 Document Revised: 08/17/2015 Document Reviewed: 04/06/2014 Elsevier Interactive Patient Education  2018 [employer].  Wrist Pain, Adult There are many things that can cause wrist pain. Some common causes include:  An injury to the wrist area, such as a sprain, strain, or  fracture.  Overuse of the joint.  A condition that causes increased pressure on a nerve in the wrist (carpal tunnel syndrome).  Wear and tear of the joints that occurs with aging (osteoarthritis).  A variety of other types of arthritis.  Sometimes, the cause of wrist pain is not known. Often, the pain goes away when you follow instructions from your health care provider for relieving pain at home, such as resting or icing the wrist. If your wrist pain continues, it is important to tell your health care provider. Follow these instructions at home:  Rest the wrist area for at least 48 hours or as long as told by your health care provider.  If a splint or elastic bandage has been applied, use it as told by your health care provider. ? Remove the splint or bandage only as told by your health care provider. ? Loosen the splint or bandage if your fingers tingle, become numb, or turn cold or blue.  If directed, apply ice to the injured area. ? If you have a removable splint or elastic bandage, remove it as told by your health care provider. ? Put ice in a plastic bag. ? Place a towel between your skin and the bag or between your splint or bandage and the bag. ? Leave the ice on for 20 minutes, 2-3 times a day.  Keep your arm raised (elevated) above the level of your heart while you are sitting or lying down.  Take over-the-counter  and prescription medicines only as told by your health care provider.  Keep all follow-up visits as told by your health care provider. This is important. Contact a health care provider if:  You have a sudden sharp pain in the wrist, hand, or arm that is different or new.  The swelling or bruising on your wrist or hand gets worse.  Your skin becomes red, gets a rash, or has open sores.  Your pain does not get better or it gets worse. Get help right away if:  You lose feeling in your fingers or hand.  Your fingers turn white, very red, or cold and  blue.  You cannot move your fingers.  You have a fever or chills. This information is not intended to replace advice given to you by your health care provider. Make sure you discuss any questions you have with your health care provider. Document Released: 12/19/2004 Document Revised: 10/05/2015 Document Reviewed: 09/28/2015 Elsevier Interactive Patient Education  2018 [place]    Tennis Elbow Tennis elbow is puffiness (inflammation) of the outer tendons of your forearm close to your elbow. Your tendons attach your muscles to your bones. Tennis elbow can happen in any sport or job in which you use your elbow too much. It is caused by doing the same motion over and over. Tennis elbow can cause:  Pain and tenderness in your forearm and the outer part of your elbow.  A burning feeling. This runs from your elbow through your arm.  Weak grip in your hands.  Follow these instructions at home: Activity  Rest your elbow and wrist as told by your doctor. Try to avoid any activities that caused the problem until your doctor says that you can do them again.  If a physical therapist teaches you exercises, do all of them as told.  If you lift an object, lift it with your palm facing up. This is easier on your elbow. Lifestyle  If your tennis elbow is caused by sports, check your equipment and make sure that: ? You are using it correctly. ? It fits you well.  If your tennis elbow is caused by work, take breaks often, if you are able. Talk with your manager about doing your work in a way that is safe for you. ? If your tennis elbow is caused by computer use, talk with your manager about any changes that can be made to your work setup. General instructions  If told, apply ice to the painful area: ? Put ice in a plastic bag. ? Place a towel between your skin and the bag. ? Leave the ice on for 20 minutes, 2-3 times per day.  Take medicines only as told by your doctor.  If you were  given a brace, wear it as told by your doctor.  Keep all follow-up visits as told by your doctor. This is important. Contact a doctor if:  Your pain does not get better with treatment.  Your pain gets worse.  You have weakness in your forearm, hand, or fingers.  You cannot feel your forearm, hand, or fingers. This information is not intended to replace advice given to you by your health care provider. Make sure you discuss any questions you have with your health care provider. Document Released: 08/29/2009 Document Revised: 11/09/2015 Document Reviewed: 03/07/2014 Elsevier Interactive Patient Education  [employer].

## 2018-01-10 ENCOUNTER — Encounter: Payer: Self-pay | Admitting: Internal Medicine

## 2018-01-12 ENCOUNTER — Other Ambulatory Visit: Payer: Self-pay | Admitting: Internal Medicine

## 2018-01-12 DIAGNOSIS — Z856 Personal history of leukemia: Secondary | ICD-10-CM

## 2018-01-12 DIAGNOSIS — R21 Rash and other nonspecific skin eruption: Secondary | ICD-10-CM

## 2018-01-22 ENCOUNTER — Encounter: Payer: Self-pay | Admitting: Oncology

## 2018-01-23 ENCOUNTER — Encounter: Payer: Self-pay | Admitting: Internal Medicine

## 2018-01-29 ENCOUNTER — Telehealth: Payer: Self-pay | Admitting: Pharmacist

## 2018-01-29 NOTE — Telephone Encounter (Signed)
Oral Oncology Pharmacist Encounter   Prior Authorization for Jennifer English has been approved.     PA# [id number] Effective dates: 01/29/18 through 01/30/19   Oral Oncology Clinic will continue to follow.   [name], PharmD, BCPS. BCOP Hematology/Oncology Clinical Pharmacist [facility] [phone]  01/29/2018 2:42 PM

## 2018-01-29 NOTE — Telephone Encounter (Signed)
Oral Oncology Pharmacist Encounter   Received notification from [place] that a new prior authorization for Tasigna is required. The patient has a new insurance.   PA submitted on CMM Key [id number] Status is pending   Oral Oncology Clinic will continue to follow.   [name], PharmD, BCPS, [facility] Hematology/Oncology Clinical Pharmacist [facility] [place] [facility] [phone]  01/29/2018 2:40 PM

## 2018-02-10 ENCOUNTER — Telehealth: Payer: Self-pay | Admitting: Pharmacist

## 2018-02-10 DIAGNOSIS — C9210 Chronic myeloid leukemia, BCR/ABL-positive, not having achieved remission: Secondary | ICD-10-CM

## 2018-02-10 MED ORDER — NILOTINIB HCL 150 MG PO CAPS: 300.0000 mg | ORAL_CAPSULE | Freq: Two times a day (BID) | ORAL | 0 refills | Status: DC

## 2018-02-10 NOTE — Telephone Encounter (Signed)
Oral Chemotherapy Pharmacist Encounter  Successfully in obtaining a Tasigna one month free voucher.    Tasigna voucher:  ID: [id number] BIN: [id number] PCN: [id number] GRP: [id number]  Billing information will be shared with [facility]. I will place a copy of the voucher to be scanned into patient's chart.   [name], PharmD, BCPS Hematology/Oncology Clinical Pharmacist [facility] [place] [facility] [phone]  02/10/2018 2:40 PM

## 2018-02-10 NOTE — Telephone Encounter (Signed)
Oral Chemotherapy Pharmacist Encounter   Received information from [place] that the patient's insurance had termed. I reached out to Ms. Lamar and she is currently in between jobs and interviewing for a new one. She hopes to be re-employed in the next few weeks.   She has 3 weeks worth of medications on hand now. Offered to get her set-up with a one month free Tasigna voucher to provide her with more medication during this time. Offered to try to sign her up for manufacturer assistance, at this time she would like to hold off. If she does not find a new job she will get on her [employer] plan for coverage.  Spoke with Dr. Tasia Catchings and she okayed a prescription to be sent to [facility] for Ms. Larmore to use with the voucher.   Ms. Wolven knows to let us know if she needs further assistance and she will call us with her new insurance information when that is available.  [name], PharmD, BCPS, [facility] Hematology/Oncology Clinical Pharmacist [facility] Oral [place] [facility] [phone]  02/10/2018 2:16 PM

## 2018-02-11 MED FILL — TASIGNA 150 MG CAPSULE: 150 | 28 days supply | Qty: 112 | Fill #0

## 2018-02-17 ENCOUNTER — Other Ambulatory Visit: Payer: Self-pay | Admitting: Internal Medicine

## 2018-02-17 ENCOUNTER — Encounter: Payer: Self-pay | Admitting: Internal Medicine

## 2018-02-17 DIAGNOSIS — B354 Tinea corporis: Secondary | ICD-10-CM

## 2018-03-05 ENCOUNTER — Inpatient Hospital Stay: Payer: Self-pay | Admitting: Oncology

## 2018-03-05 ENCOUNTER — Inpatient Hospital Stay: Payer: Self-pay

## 2018-03-12 ENCOUNTER — Encounter: Payer: Self-pay | Admitting: Internal Medicine

## 2018-04-24 ENCOUNTER — Telehealth: Payer: Self-pay | Admitting: Pharmacy Technician

## 2018-04-24 NOTE — Telephone Encounter (Signed)
Oral Oncology Patient Advocate Encounter  Received [facility] from [facility] that prior authorization for Tasigna is required.  Patient has new insurance.  PA submitted on CoverMyMeds Key [id number] Status is pending  Oral Oncology Clinic will continue to follow.  [place] Patient [place] Phone [phone] Fax [phone] 04/24/2018 1:46 PM

## 2018-04-27 NOTE — Telephone Encounter (Signed)
Oral Oncology Patient Advocate Encounter  Prior Authorization for Jennifer English has been approved.    PA# [id number] Effective dates: 04/24/2018 through 04/25/2019  [facility] must [facility] filled at [facility] per insurance.  Oral Oncology Clinic will continue to follow.   [place] Patient [place] Phone [phone] Fax [phone] 04/27/2018 9:11 AM

## 2018-04-28 ENCOUNTER — Other Ambulatory Visit: Payer: Self-pay

## 2018-04-28 ENCOUNTER — Ambulatory Visit: Payer: Self-pay | Admitting: Oncology

## 2018-04-29 ENCOUNTER — Other Ambulatory Visit: Payer: Self-pay

## 2018-04-29 ENCOUNTER — Inpatient Hospital Stay (HOSPITAL_BASED_OUTPATIENT_CLINIC_OR_DEPARTMENT_OTHER): Payer: 59 | Admitting: Oncology

## 2018-04-29 ENCOUNTER — Telehealth: Payer: Self-pay | Admitting: Pharmacy Technician

## 2018-04-29 ENCOUNTER — Encounter: Payer: Self-pay | Admitting: Oncology

## 2018-04-29 ENCOUNTER — Other Ambulatory Visit: Payer: Self-pay | Admitting: Pharmacist

## 2018-04-29 ENCOUNTER — Inpatient Hospital Stay: Payer: 59 | Attending: Oncology

## 2018-04-29 VITALS — BP 99/69 | HR 76 | Temp 97.6°F | Resp 12 | Ht 64.0 in | Wt 193.6 lb

## 2018-04-29 DIAGNOSIS — C9210 Chronic myeloid leukemia, BCR/ABL-positive, not having achieved remission: Secondary | ICD-10-CM | POA: Insufficient documentation

## 2018-04-29 DIAGNOSIS — D72819 Decreased white blood cell count, unspecified: Secondary | ICD-10-CM | POA: Diagnosis not present

## 2018-04-29 DIAGNOSIS — E039 Hypothyroidism, unspecified: Secondary | ICD-10-CM | POA: Insufficient documentation

## 2018-04-29 LAB — CBC WITH DIFFERENTIAL/PLATELET
Abs Immature Granulocytes: 0.01 10*3/uL (ref 0.00–0.07)
BASOS ABS: 0 10*3/uL (ref 0.0–0.1)
BASOS PCT: 1 %
EOS ABS: 0.2 10*3/uL (ref 0.0–0.5)
EOS PCT: 6 %
HCT: 38.1 % (ref 36.0–46.0)
Hemoglobin: 12.7 g/dL (ref 12.0–15.0)
IMMATURE GRANULOCYTES: 0 %
Lymphocytes Relative: 39 %
Lymphs Abs: 1.5 10*3/uL (ref 0.7–4.0)
MCH: 30.7 pg (ref 26.0–34.0)
MCHC: 33.3 g/dL (ref 30.0–36.0)
MCV: 92 fL (ref 80.0–100.0)
Monocytes Absolute: 0.3 10*3/uL (ref 0.1–1.0)
Monocytes Relative: 8 %
NEUTROS PCT: 46 %
Neutro Abs: 1.7 10*3/uL (ref 1.7–7.7)
PLATELETS: 203 10*3/uL (ref 150–400)
RBC: 4.14 MIL/uL (ref 3.87–5.11)
RDW: 12.6 % (ref 11.5–15.5)
WBC: 3.7 10*3/uL — ABNORMAL LOW (ref 4.0–10.5)
nRBC: 0 % (ref 0.0–0.2)

## 2018-04-29 LAB — COMPREHENSIVE METABOLIC PANEL
ALBUMIN: 4.1 g/dL (ref 3.5–5.0)
ALT: 17 U/L (ref 0–44)
ANION GAP: 5 (ref 5–15)
AST: 19 U/L (ref 15–41)
Alkaline Phosphatase: 75 U/L (ref 38–126)
BUN: 12 mg/dL (ref 6–20)
CO2: 28 mmol/L (ref 22–32)
Calcium: 8.9 mg/dL (ref 8.9–10.3)
Chloride: 105 mmol/L (ref 98–111)
Creatinine, Ser: 0.72 mg/dL (ref 0.44–1.00)
GFR calc Af Amer: >60 mL/min (ref >60)
Glucose, Bld: 94 mg/dL (ref 70–99)
POTASSIUM: 4 mmol/L (ref 3.5–5.1)
Sodium: 138 mmol/L (ref 135–145)
TOTAL PROTEIN: 7.8 g/dL (ref 6.5–8.1)
Total Bilirubin: 0.7 mg/dL (ref 0.3–1.2)

## 2018-04-29 MED ORDER — NILOTINIB HCL 150 MG PO CAPS: 300.0000 mg | ORAL_CAPSULE | Freq: Two times a day (BID) | ORAL | 2 refills | Status: DC

## 2018-04-29 NOTE — Telephone Encounter (Signed)
Oral Oncology Patient Advocate Encounter  Patient had appointment today 2/5 with Dr Tasia Catchings and medication was escribed to [facility].  Alyson spoke with the patient to make her aware of the pharmacy change.  We will follow up with [facility] to make sure patient receives first shipment of medication.  [place] Patient [place] Phone [phone] Fax [phone] 04/29/2018 12:48 PM

## 2018-04-29 NOTE — Telephone Encounter (Signed)
Oral Oncology Patient Advocate Encounter  Due to patients insurance change, [facility] is not able to fill Tasigna.  [facility] shared copay card information for Tasigna which has been forwarded to [facility].  The billing information is as follows:   RxBin: [id number] PCN: [id number] Member ID: [id number] Group ID: [id number]   Jennifer English Patient [place] Phone [phone] Fax [phone] 04/29/2018 12:49 PM

## 2018-04-29 NOTE — Progress Notes (Signed)
Oral Chemotherapy Pharmacist Encounter   Change in insurance patient now has to use [facility]. Prescription for Tasigna sent to [facility].   [name], PharmD, BCPS, [facility] Hematology/Oncology Clinical Pharmacist [facility] Oral [place] [facility] [phone]  04/29/2018 10:47 AM

## 2018-04-29 NOTE — Progress Notes (Signed)
Patient here for follow up. She reports frequent headaches and her left ear feels plugged.

## 2018-04-29 NOTE — Telephone Encounter (Signed)
Left voicemail 2/4 for patient to return my call.

## 2018-04-29 NOTE — Progress Notes (Signed)
Hematology/Oncology follow up note [facility] Telephone:([phone]) [phone] Fax:(336) [phone]   Patient Care Team: McLean-Scocuzza, Nino Glow, MD as PCP - General (Internal Medicine)  REFERRING PROVIDER: Previous oncologist from [place] Dr.Hasan Rizvi CHIEF COMPLAINTS/PURPOSE OF CONSULTATION:  Management of CML  HISTORY OF PRESENTING ILLNESS:  Jennifer English is a  53 y.o.  female with PMH listed below who was referred to by her oncologist in [place]. [place] to [place] this summer. She has not had local pcp yet. Extensive medical records review was performed.  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 She had dyuria symptoms at that time and lab work up found extreme leukocytosis with WBC 173,500 and she was initially started on hydrea. After CML was confirmed, she was started on Tasigna 300mg  BID since then and has achieved remission. . Also had splenomegaly at presentation about 5-6 cm below LCM, resolved. . She reports being complaint on nilotinib however, she has had period of time when her medication runs out. Although she moved this summer, due to lot of stresses associated with relocation and new job, she waited until now see hematologist.  Denies any fever, chill, weight loss, bleeding events. She used to have EKG done every 3 months and faxed to oncologist. She has not had any EKG done after July this year. No PCP Also reports recently feeling urine has odor and also very mild burning sensation when passing urine. She requests to have urine tested.  Has hypothyroidism, not taking thyroid supplements.  History of iron deficiency due to heavy menstrual periods/uterine fibroids, taking iron supplements.  # 10/09/2012, bone marrow aspirate smears and clots: Markedly hypercellular marrow with prominent myeloid hyperplasia and increasing small hypolobated megakaryocytes. The overall findings are consistent with chronic myeloid leukemia, chronic  phase. # 03/22/2015 BCR ABL quantification PCR showed e13a2 (b2a2 p210) transcript <0.001%,e14a2 ( b3a2, p210)  transcript 0.023%, e1a2 (p190) transcript <0.001% # 08/17/2015 BCR ABL quantification PCR showed e13a2 (b2a2 p210) transcript 0.011%,e14a2 ( b3a2, p210)  transcript <0.001%, e1a2 (p190) transcript <0.001%  # 04/25/2016 BCR ABL quantification PCR showed e13a2 (b2a2 p210) transcript <0.001%,e14a2 ( b3a2, p210)  transcript <0.001%, e1a2 (p190) transcript <0.001%  #09/17/2016  BCR ABL quantification PCR showed e13a2 (b2a2 p210) transcript <0.0032%,e14a2 ( b3a2, p210)  transcript <0.0032%, e1a2 (p190) transcript <0.0032%   02/19/2017 BCR ABL quantification PCR showed b2a2 transcript <0.0032%, b3a2 transcript <0.0032%, E1A2 transcript <0.0032%  INTERVAL HISTORY Jennifer English is a 53 y.o. female who has above history reviewed by me today presents for follow up of treatment of CML.  She reports doing fine except recently she had upper respiratory symptoms still feel her ear fullness. Energy level "ups and downs" Patient told me that she changed labs and has to change to a new insurance. Feels anxious about not having recovered to her baseline since most recent URI.  Takes Tasigna as instructed daily.  Compliant for most of the times. Skin rash that was discovered at the last visit went away. Denies any fever, chills, cough, diarrhea, chest pain, abdominal pain.  Review of Systems  Constitutional: Negative for chills, fever, malaise/fatigue and weight loss.  HENT: Negative for congestion, ear discharge, ear pain, hearing loss, nosebleeds, sinus pain, sore throat and tinnitus.   Eyes: Negative for blurred vision, double vision, photophobia, pain, discharge and redness.  Respiratory: Negative for cough, hemoptysis, sputum production, shortness of breath and wheezing.   Cardiovascular: Negative for chest pain, palpitations, orthopnea, claudication and leg  swelling.  Gastrointestinal: Negative  for abdominal pain, blood in stool, constipation, diarrhea, heartburn, melena, nausea and vomiting.  Genitourinary: Negative for dysuria, flank pain, frequency and hematuria.  Musculoskeletal: Negative for back pain, myalgias and neck pain.  Skin: Negative for itching and rash.  Neurological: Negative for dizziness, tingling, tremors, focal weakness, weakness and headaches.  Endo/Heme/Allergies: Negative for environmental allergies. Does not bruise/bleed easily.  Psychiatric/Behavioral: Negative for depression, hallucinations and suicidal ideas. The patient is not nervous/anxious.     MEDICAL HISTORY:  Past Medical History:  Diagnosis Date  . Allergy   . CML (chronic myelocytic leukemia) ([place])   . Fibroids   . Heart murmur   . Thyroid disease    h/o thyroid d/o not on meds   . Uterine fibroid   . UTI (urinary tract infection)    x2   . 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 CESAREAN SECTION     x 1 2003   . CYSTECTOMY     middle finger right hand   . OTHER SURGICAL HISTORY     cyst removed from finger  . TONSILECTOMY, ADENOIDECTOMY, BILATERAL MYRINGOTOMY AND TUBES    . TYMPANOSTOMY TUBE PLACEMENT     1978    SOCIAL HISTORY: Social History   Socioeconomic History  . Marital status: Married    Spouse name: Not on file  . Number of children: Not on file  . Years of education: Not on file  . Highest education level: Not on file  Occupational History  . Not on file  Social Needs  . Financial resource strain: Not on file  . Food insecurity:    Worry: Not on file    Inability: Not on file  . Transportation needs:    Medical: Not on file    Non-medical: Not on file  Tobacco Use  . Smoking status: Never Smoker  . Smokeless tobacco: Never Used  Substance and Sexual Activity  . Alcohol use: No    Frequency: Never  . Drug use: No  . Sexual activity: Yes    Birth  control/protection: None  Lifestyle  . Physical activity:    Days per week: Not on file    Minutes per session: Not on file  . Stress: Not on file  Relationships  . Social connections:    Talks on phone: Not on file    Gets together: Not on file    Attends religious service: Not on file    Active member of club or organization: Not on file    Attends meetings of clubs or organizations: Not on file    Relationship status: Not on file  . Intimate partner violence:    Fear of current or ex partner: Not on file    Emotionally abused: Not on file    Physically abused: Not on file    Forced sexual activity: Not on file  Other Topics Concern  . Not on file  Social History Narrative   Married lives with daughter husband and mother in law   23 daughter    [employer] [place]   [place] from [place] 2018     FAMILY HISTORY: Family History  Problem Relation Age of Onset  . Heart disease Father        CAD with stents   . Hyperlipidemia Father   . Hypertension Father   .  Dementia Maternal Grandfather   . Melanoma Paternal Grandmother   . Bladder Cancer Paternal Grandmother   . Cancer Paternal Grandmother        lung/colon ?primary   . Arthritis Paternal Grandmother   . Bladder Cancer Paternal Grandfather   . Cancer Paternal Grandfather        bladder died in 29s   . Early death Paternal Grandfather   . Alcohol abuse Maternal Grandmother   . Hepatitis Maternal Grandmother   . Early death Maternal Grandmother   . Kidney disease Maternal Grandmother   . Macular degeneration Maternal Grandmother   . Liver disease Maternal Grandmother        ?alcoholic hepatitis   . Asthma Brother   . Asthma Brother   . Macular degeneration Maternal Aunt     ALLERGIES:  is allergic to ibuprofen; levoxyl [levothyroxine sodium]; synthroid [levothyroxine]; aspirin; avocado; eggs or egg-derived products; and penicillins.  MEDICATIONS:  Current Outpatient  Medications  Medication Sig Dispense Refill  . Ascorbic Acid (VITAMIN C) 100 MG tablet Take 100 mg by mouth daily.    . ferrous sulfate 325 (65 FE) MG tablet Take 325 mg by mouth daily with breakfast.    . fexofenadine (ALLEGRA) 180 MG tablet Take 180 mg by mouth daily as needed for allergies or rhinitis.    . hydrocortisone 2.5 % cream Apply topically 2 (two) times daily. Right back 30 g 0  . ketoconazole (NIZORAL) 2 % cream Use 2-3 x per day x 1-3 weeks 60 g 11  . magnesium citrate SOLN Take 1 Bottle by mouth once.    . nilotinib (TASIGNA) 150 MG capsule Take 2 capsules (300 mg total) by mouth 2 (two) times daily. Take on an empty stomach, 1 hour before or 2 hours after food. 120 capsule 2   No current facility-administered medications for this visit.      PHYSICAL EXAMINATION: ECOG PERFORMANCE STATUS: 0 - Asymptomatic Vitals:   04/29/18 0924 04/29/18 0933  BP:  99/69  Pulse:  76  Resp: 12   Temp:  97.6 F (36.4 C)   Filed Weights   04/29/18 0924  Weight: 193 lb 9.6 oz (87.8 kg)   Physical Exam  Constitutional: She is oriented to person, place, and time and well-developed, well-nourished, and in no distress. No distress.  HENT:  Head: Normocephalic and atraumatic.  Nose: Nose normal.  Mouth/Throat: Oropharynx is clear and moist. No oropharyngeal exudate.  Left ear fullness  Eyes: Pupils are equal, round, and reactive to light. Conjunctivae and EOM are normal. Left eye exhibits no discharge. No scleral icterus.  Neck: Normal range of motion. Neck supple.  Cardiovascular: Normal rate, regular rhythm and normal heart sounds. Exam reveals no gallop.  No murmur heard. Pulmonary/Chest: Effort normal and breath sounds normal. No respiratory distress. She has no wheezes. She has no rales. She exhibits no tenderness.  Abdominal: Soft. Bowel sounds are normal. She exhibits no distension and no mass. There is no abdominal tenderness.  Musculoskeletal: Normal range of motion.         General: No edema.  Lymphadenopathy:    She has no cervical adenopathy.  Neurological: She is alert and oriented to person, place, and time. No cranial nerve deficit. She exhibits normal muscle tone. Coordination normal.  Skin: Skin is warm and dry. No rash noted. She is not diaphoretic. No erythema.  Psychiatric: Affect and judgment normal.        LABORATORY DATA:  I have reviewed the data as  listed Lab Results  Component Value Date   WBC 3.7 (L) 04/29/2018   HGB 12.7 04/29/2018   HCT 38.1 04/29/2018   MCV 92.0 04/29/2018   PLT 203 04/29/2018   Recent Labs    09/03/17 1412 12/04/17 1256 04/29/18 0910  NA 136 137 138  K 3.5 4.0 4.0  CL 105 102 105  CO2 26 30 28   GLUCOSE 80 106* 94  BUN 12 14 12   CREATININE 0.76 0.92 0.72  CALCIUM 8.7* 9.2 8.9  GFRNONAA >60 >60 >60  GFRAA >60 >60 >60  PROT 7.3 7.9 7.8  ALBUMIN 3.7 4.1 4.1  AST 17 20 19   ALT 13* 19 17  ALKPHOS 61 78 75  BILITOT 0.8 0.8 0.7    # 09/03/2017  BCR-ABL1 PCR quantification: NEGATIVE for the BCR-ABL1 e1a2 (p190), e13a2 (b2a2, p210) and e14a2  (b3a2, p210) fusion transcripts   ASSESSMENT & PLAN:  1. CML (chronic myelocytic leukemia) (HCC)   2. Leukopenia, unspecified type   #CML, EKG was performed during this visit and independently reviewed by me.  Normal QTc interval. Labs reviewed and discussed with patient. Continue to Cigna 300 mg twice daily. BCR ABL qualification pending. Continue follow-up with primary care physician for chronic medical problems. #Mild leukopenia, normal differential.  Possibly secondary to recent upper respiratory infection.  Continue to monitor.  Repeat CBC in 3 months. The patient knows to call the clinic with any problems questions or concerns. We spent sufficient time to discuss many aspect of care, questions were answered to patient's satisfaction. Total face to face encounter time for this patient visit was 25 min. >50% of the time was  spent in counseling and  coordination of care.   Return of visit: 3 months.  Check CBC, CMP, BCR ABL quantification.  [name], MD, PhD Hematology Oncology [facility] at [facility] Pager- [phone] 04/29/2018

## 2018-05-05 LAB — BCR-ABL1, CML/ALL, PCR, QUANT

## 2018-05-06 NOTE — Telephone Encounter (Signed)
Called 2/7 and 2/11 [facility] to check the status of patients prescription.  [facility] had called and left a couple messages for the patient to call back.  I called the patient on 2/11 and spoke with her.  She said that [facility].

## 2018-05-08 NOTE — Telephone Encounter (Signed)
Tried to call patient to have [facility] to complete her new patient enrollment and schedule delivery of medication.  Voicemail was full, but option to send SMS text message was available.  I had an SMS message sent to patient with my return phone number.

## 2018-05-11 ENCOUNTER — Ambulatory Visit: Payer: Self-pay

## 2018-05-11 NOTE — Telephone Encounter (Signed)
Pt c/o 2 week h/o intermittent dizziness with bending over. Pt stated that the dizziness has occured 2-3 times in a 2 week period. Pt c/o fluid in ear. Pt stated that she has had dizziness on and off for years. Denies fever, chest pain, vomiting, diarrhea or bleeding. Care advice given and pt verbalized understanding. Pt given appt for Wednesday with Philis Nettle FNP.  Reason for Disposition . [1] MILD dizziness (e.g., walking normally) AND [2] has been evaluated by physician for this  Answer Assessment - Initial Assessment Questions 1. DESCRIPTION: "Describe your dizziness."    Scared her last for a couple of seconds 2-3 times in the last few weeks 2. LIGHTHEADED: "Do you feel lightheaded?" (e.g., somewhat faint, woozy, weak upon standing)     Yes- not lightheaded 3. VERTIGO: "Do you feel like either you or the room is spinning or tilting?" (i.e. vertigo)     no 4. SEVERITY: "How bad is it?"  "Do you feel like you are going to faint?" "Can you stand and walk?"   - MILD - walking normally   - MODERATE - interferes with normal activities (e.g., work, school)    - SEVERE - unable to stand, requires support to walk, feels like passing out now.      mild 5. ONSET:  "When did the dizziness begin?"     2 weeks ago 6. AGGRAVATING FACTORS: "Does anything make it worse?" (e.g., standing, change in head position)     Bending over 7. HEART RATE: "Can you tell me your heart rate?" "How many beats in 15 seconds?"  (Note: not all patients can do this)      Pt driving  Cannot  BP yesterday 138/70 pt stated pulse was good 8. CAUSE: "What do you think is causing the dizziness?"    Fluid in ear 9. RECURRENT SYMPTOM: "Have you had dizziness before?" If so, ask: "When was the last time?" "What happened that time?"     Years ago off and on 10. OTHER SYMPTOMS: "Do you have any other symptoms?" (e.g., fever, chest pain, vomiting, diarrhea, bleeding)       no 11. PREGNANCY: "Is there any chance you are  pregnant?" "When was your last menstrual period?"       N/a- LMP 1 year ago  Protocols used: [place]

## 2018-05-13 ENCOUNTER — Ambulatory Visit: Payer: 59 | Admitting: Family Medicine

## 2018-05-13 ENCOUNTER — Encounter: Payer: Self-pay | Admitting: Family Medicine

## 2018-05-13 VITALS — BP 120/78 | HR 77 | Temp 97.9°F | Resp 18 | Ht 64.0 in | Wt 192.8 lb

## 2018-05-13 DIAGNOSIS — H938X3 Other specified disorders of ear, bilateral: Secondary | ICD-10-CM

## 2018-05-13 DIAGNOSIS — R0982 Postnasal drip: Secondary | ICD-10-CM

## 2018-05-13 DIAGNOSIS — H8110 Benign paroxysmal vertigo, unspecified ear: Secondary | ICD-10-CM | POA: Diagnosis not present

## 2018-05-13 NOTE — Progress Notes (Signed)
Subjective:    Patient ID: Jennifer English, female    DOB: 01/19/1966, 53 y.o.   MRN: [id number]  HPI   Patient presents to clinic due to feeling of ear fullness, dizziness and some nasal congestion that is been present for about 2 weeks.  Patient states she did put some Vicks VapoRub on a cottonball and put an ear, did this for a few nights because of something her mother used to do for her as a child in ear fullness and sensation of dizziness seems better today.  Patient states she noticed episodes of dizziness the first time, when she sat up out of bed very quickly.  Felt like room is spinning.  Second time she noticed the dizziness was when she was sitting in a chair at the nail salon and leaned forward to reach her phone, felt dizzy and lightheaded. Patient states anytime she has a symptom she does become concerned due to her history of leukemia.  Currently she is not taking any sort of allergy medication or nasal sprays.  Denies any fever or chills.  Denies any thick purulent nasal drainage.  Denies any cough, shortness of breath or wheezing.  Denies chest pain.  Denies nausea/vomiting or diarrhea.  Patient Active Problem List   Diagnosis Date Noted  . Hot flashes 12/12/2017  . Cardiac murmur 07/03/2017  . DUB (dysfunctional uterine bleeding) 07/03/2017  . Fibroid 05/28/2017  . CML (chronic myelocytic leukemia) ([place]) 02/20/2017  . Hypothyroidism 02/20/2017   Social History   Tobacco Use  . Smoking status: Never Smoker  . Smokeless tobacco: Never Used  Substance Use Topics  . Alcohol use: No    Frequency: Never   Review of Systems   Constitutional: Negative for chills, fatigue and fever.  HENT: +some sinus congestion, ear pain/fullness, dizziness with position change.  Eyes: Negative.   Respiratory: Negative for cough, shortness of breath and wheezing.   Cardiovascular: Negative for chest pain, palpitations and leg swelling.  Gastrointestinal: Negative for abdominal pain,  diarrhea, nausea and vomiting.  Genitourinary: Negative for dysuria, frequency and urgency.  Musculoskeletal: Negative for arthralgias and myalgias.  Skin: Negative for color change, pallor and rash.  Neurological: Negative for syncope, light-headedness and headaches.  Psychiatric/Behavioral: The patient is not nervous/anxious.       Objective:   Physical Exam Vitals signs and nursing note reviewed.  Constitutional:      General: She is not in acute distress.    Appearance: She is not ill-appearing or toxic-appearing.  HENT:     Head: Normocephalic and atraumatic.     Right Ear: A middle ear effusion is present. Tympanic membrane is not injected, erythematous or bulging.     Left Ear: A middle ear effusion is present. Tympanic membrane is not injected, erythematous or bulging.     Ears:     Comments: Fullness bilat TMs, pink bilat TMs    Nose: Rhinorrhea (small amount of clear nasal drainage. ) present.     Mouth/Throat:     Pharynx: No oropharyngeal exudate or posterior oropharyngeal erythema.     Comments: +post nasal drip Eyes:     General: No scleral icterus.       Right eye: No discharge.        Left eye: No discharge.     Extraocular Movements: Extraocular movements intact.     Conjunctiva/sclera: Conjunctivae normal.     Pupils: Pupils are equal, round, and reactive to light.  Neck:     Musculoskeletal:  Neck supple. No neck rigidity.  Cardiovascular:     Rate and Rhythm: Normal rate and regular rhythm.     Heart sounds: Normal heart sounds.  Pulmonary:     Effort: Pulmonary effort is normal. No respiratory distress.     Breath sounds: Normal breath sounds. No wheezing, rhonchi or rales.  Lymphadenopathy:     Cervical: No cervical adenopathy.  Skin:    General: Skin is warm and dry.     Capillary Refill: Capillary refill takes less than 2 seconds.     Coloration: Skin is not jaundiced or pale.  Neurological:     Mental Status: She is alert and oriented to person,  place, and time.  Psychiatric:        Mood and Affect: Mood normal.        Behavior: Behavior normal.    Vitals:   05/13/18 0848  BP: 120/78  Pulse: 77  Resp: 18  Temp: 97.9 F (36.6 C)  SpO2: 98%      Assessment & Plan:    A total of 25 minutes were spent face-to-face with the patient during this encounter and over half of that time was spent on counseling and coordination of care. The patient was counseled on how ear nose and throat are connected, how fluid in your ear can cause sensation of fullness and contribute to dizziness.   Ear fullness bilaterally, postnasal drip, Vertigo - patient's physical exam is unremarkable for any sort of ear infection or sinus infection.  Suspect the sensation of ear fullness, dizziness is related to the middle ear effusion.  She will take Allegra every day to help dry up fluid in ears and dry up postnasal drainage.  Also suggested she do a nasal spray such as Flonase or a saline nasal mist, patient would prefer not to do nasal spray if she could avoid this.  Advised to keep up good fluid intake, get proper rest and do good handwashing.  Patient will keep regularly scheduled follow-up with PCP as planned.  Advised to return to clinic sooner if any issues arise.  Advised if ear fullness sensation persists, we can consider referral to ENT.

## 2018-05-13 NOTE — Patient Instructions (Signed)
Try allegra (generic:fexofenadine) to help ear fullness/allergy symptoms

## 2018-05-25 ENCOUNTER — Ambulatory Visit: Payer: Self-pay

## 2018-05-25 NOTE — Telephone Encounter (Signed)
Called pt and left message to call back.

## 2018-05-25 NOTE — Telephone Encounter (Signed)
3rd attempt to reach pt. Left message on voice mail.

## 2018-05-25 NOTE — Telephone Encounter (Signed)
Called pt and left VM to call back.

## 2018-06-02 ENCOUNTER — Encounter: Payer: Self-pay | Admitting: Oncology

## 2018-06-08 ENCOUNTER — Encounter: Payer: Self-pay | Admitting: Oncology

## 2018-06-09 ENCOUNTER — Telehealth: Payer: Self-pay | Admitting: Pharmacist

## 2018-06-09 NOTE — Telephone Encounter (Signed)
Oral Chemotherapy Pharmacist Encounter   Received a VM from [place] stating that [facility] told her the had a $400 copay for her Tasigna. This should not be the case because she has commercial copay and Tasigna copay card to decrease her out of pocket cost to $0. I called [facility] to make sure they were billing correctly. Her copay card was not billed correctly on their end but the person I spoke with on the phone was able to correct this. Conferenced [place] into the call and they were able to get her set-up for a new shipment date for her copay at a $0 copay.  [name], PharmD, BCPS, [facility] Hematology/Oncology Clinical Pharmacist [facility]/AP [facility] [phone]  06/09/2018 8:50 AM

## 2018-06-26 ENCOUNTER — Encounter: Payer: Self-pay | Admitting: Internal Medicine

## 2018-06-26 ENCOUNTER — Ambulatory Visit (INDEPENDENT_AMBULATORY_CARE_PROVIDER_SITE_OTHER): Payer: 59 | Admitting: Internal Medicine

## 2018-06-26 ENCOUNTER — Other Ambulatory Visit: Payer: Self-pay

## 2018-06-26 DIAGNOSIS — Z1329 Encounter for screening for other suspected endocrine disorder: Secondary | ICD-10-CM

## 2018-06-26 DIAGNOSIS — Z0000 Encounter for general adult medical examination without abnormal findings: Secondary | ICD-10-CM

## 2018-06-26 DIAGNOSIS — E039 Hypothyroidism, unspecified: Secondary | ICD-10-CM

## 2018-06-26 DIAGNOSIS — Z1389 Encounter for screening for other disorder: Secondary | ICD-10-CM

## 2018-06-26 DIAGNOSIS — R946 Abnormal results of thyroid function studies: Secondary | ICD-10-CM

## 2018-06-26 DIAGNOSIS — Z13220 Encounter for screening for lipoid disorders: Secondary | ICD-10-CM

## 2018-06-26 DIAGNOSIS — R42 Dizziness and giddiness: Secondary | ICD-10-CM

## 2018-06-26 DIAGNOSIS — Z1231 Encounter for screening mammogram for malignant neoplasm of breast: Secondary | ICD-10-CM

## 2018-06-26 DIAGNOSIS — C9210 Chronic myeloid leukemia, BCR/ABL-positive, not having achieved remission: Secondary | ICD-10-CM

## 2018-06-26 DIAGNOSIS — E559 Vitamin D deficiency, unspecified: Secondary | ICD-10-CM

## 2018-06-26 DIAGNOSIS — R21 Rash and other nonspecific skin eruption: Secondary | ICD-10-CM

## 2018-06-26 NOTE — Progress Notes (Signed)
Virtual Visit via Video Note  I connected with Jennifer English on 06/26/18 at  9:30 AM EDT -9:45 amby a video enabled telemedicine application and verified that I am speaking with the correct person using two identifiers. Location patient: home Location provider:work Persons participating in the virtual visit: patient, provider  I discussed the limitations of evaluation and management by telemedicine and the availability of in person appointments. The patient expressed understanding and agreed to proceed.   HPI: 1. CML 04/29/2018 CML testing negative WBC 3.7 slightly low but pt had URI in 03/2018 but now feeling better  2. RASH to trunk resolved for now and has not come back  3. C/o dizziness since 04/2018 intermittent last noted last week. She reports more like lightheadedness she has increased water intake. 1 x she had the room spinning. Lightheaded ness resolved for now she does not skip meals  4. Of note now working [employer] in [place] and taking 30 day leave from work which was allowed    ROS: See pertinent positives and negatives per HPI.  Past Medical History:  Diagnosis Date  . Allergy   . CML (chronic myelocytic leukemia) ([place])   . Fibroids   . Heart murmur   . Thyroid disease    h/o thyroid d/o not on meds   . Uterine fibroid   . UTI (urinary tract infection)    x2   . 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 CESAREAN SECTION     x 1 2003   . CYSTECTOMY     middle finger right hand   . OTHER SURGICAL HISTORY     cyst removed from finger  . TONSILECTOMY, ADENOIDECTOMY, BILATERAL MYRINGOTOMY AND TUBES    . TYMPANOSTOMY TUBE PLACEMENT     1978    Family History  Problem Relation Age of Onset  . Heart disease Father        CAD with stents   . Hyperlipidemia Father   . Hypertension Father   . Dementia Maternal Grandfather   . Melanoma Paternal Grandmother   . Bladder Cancer Paternal  Grandmother   . Cancer Paternal Grandmother        lung/colon ?primary   . Arthritis Paternal Grandmother   . Bladder Cancer Paternal Grandfather   . Cancer Paternal Grandfather        bladder died in 56s   . Early death Paternal Grandfather   . Alcohol abuse Maternal Grandmother   . Hepatitis Maternal Grandmother   . Early death Maternal Grandmother   . Kidney disease Maternal Grandmother   . Macular degeneration Maternal Grandmother   . Liver disease Maternal Grandmother        ?alcoholic hepatitis   . Asthma Brother   . Asthma Brother   . Macular degeneration Maternal Aunt     SOCIAL HX: married, from [place], daughter   Current Outpatient Medications:  .  Ascorbic Acid (VITAMIN C) 100 MG tablet, Take 100 mg by mouth daily., Disp: , Rfl:  .  ferrous sulfate 325 (65 FE) MG tablet, Take 325 mg by mouth daily with breakfast., Disp: , Rfl:  .  fexofenadine (ALLEGRA) 180 MG tablet, Take 180 mg by mouth daily as needed for allergies or rhinitis., Disp: , Rfl:  .  hydrocortisone 2.5 % cream, Apply topically 2 (two) times daily. Right back, Disp: 30 g, Rfl: 0 .  ketoconazole (NIZORAL) 2 %  cream, Use 2-3 x per day x 1-3 weeks, Disp: 60 g, Rfl: 11 .  magnesium citrate SOLN, Take 1 Bottle by mouth once., Disp: , Rfl:  .  nilotinib (TASIGNA) 150 MG capsule, Take 2 capsules (300 mg total) by mouth 2 (two) times daily. Take on an empty stomach, 1 hour before or 2 hours after food., Disp: 120 capsule, Rfl: 2  EXAM:  VITALS per patient if applicable:  GENERAL: alert, oriented, appears well and in no acute distress  HEENT: atraumatic, conjunttiva clear, no obvious abnormalities on inspection of external nose and ears  NECK: normal movements of the head and neck  LUNGS: on inspection no signs of respiratory distress, breathing rate appears normal, no obvious gross SOB, gasping or wheezing  CV: no obvious cyanosis  MS: moves all visible extremities without noticeable  abnormality  PSYCH/NEURO: pleasant and cooperative, no obvious depression or anxiety, speech and thought processing grossly intact  ASSESSMENT AND PLAN:  Discussed the following assessment and plan:  Fasting comp labs CMET, CBC, lipid, TSH, T3, T4, TPO, UA vitamin D 07/2018   CML (chronic myelocytic leukemia) ([facility])-reviewed labs 04/29/2018 negative CML f/u Dr. Tasia Catchings repeat WBC. EKG 04/29/2018 with normal QT interval   Abnormal thyroid function test with h/o hypothyroidism does not want to take medications in the past due to side effects- Plan: TSH, T4, free, Thyroid peroxidase antibody, T3, free -if abnormal consider armour thyroid   Rash-if returns rec visit [facility] dermatology   Lightheadedness-if continues consider MRI brain w/o contrast   HM Declines vaccines  Will need to do pap in future and consider US/TVUS with ob/gyn for DUB/fibroids  Mammogram-discussed and stressed impt of doing again today  Colonoscopy rec do once COVID resolved    I discussed the assessment and treatment plan with the patient. The patient was provided an opportunity to ask questions and all were answered. The patient agreed with the plan and demonstrated an understanding of the instructions.   The patient was advised to call back or seek an in-person evaluation if the symptoms worsen or if the condition fails to improve as anticipated.  I provided 15 minutes of non-face-to-face time during this encounter.   Nino Glow McLean-Scocuzza, MD

## 2018-06-29 ENCOUNTER — Encounter: Payer: Self-pay | Admitting: Oncology

## 2018-07-01 ENCOUNTER — Telehealth: Payer: Self-pay | Admitting: Internal Medicine

## 2018-07-01 NOTE — Telephone Encounter (Signed)
sch f/u 10 or 01/2019  Thanks [place]

## 2018-07-03 ENCOUNTER — Encounter: Payer: Self-pay | Admitting: Oncology

## 2018-07-07 ENCOUNTER — Encounter: Payer: Self-pay | Admitting: *Deleted

## 2018-07-07 ENCOUNTER — Encounter: Payer: Self-pay | Admitting: Internal Medicine

## 2018-07-09 NOTE — Telephone Encounter (Signed)
Pt has been scheduled.  °

## 2018-07-16 ENCOUNTER — Encounter: Payer: Self-pay | Admitting: Internal Medicine

## 2018-07-28 ENCOUNTER — Inpatient Hospital Stay: Payer: Self-pay

## 2018-07-28 ENCOUNTER — Inpatient Hospital Stay: Payer: Self-pay | Admitting: Oncology

## 2018-08-03 ENCOUNTER — Ambulatory Visit: Payer: Self-pay

## 2018-08-03 NOTE — Telephone Encounter (Signed)
Pt. called to report a "prickly rash that is clear to skin-toned on right hand."  Reported multiple pinpoint-sized bumps, in clusters, on the right hand.  Reported 3 areas of clusters on the top of right hand; below the index finger, below the pinkie finger, and near the medial wrist bone.  Onset of rash was "last week on Tuesday or Wednesday."  Denied rash on any other areas of body.  Reported hx of Leukemia, and has been in remission x 6 yrs.  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  Stated there is mild itching.  Stated she has been very cautious, wearing mask and gloves and washing her hands frequently.  Also stated she changes her gloves frequently during the day.   Reported she has her hands in Lysol and bleach frequently, during the day.  Voiced concern of poss. symptoms of Coronavirus.  Denied any respiratory symptoms.  Denied cough, fever, shortness of breath.  Called Scheduler; transferred pt. to office to have virtual visit scheduled with PCP.  Pt. Agreed with plan.          Reason for Disposition . [1] Pimples (localized) AND [place].Ok ] no improvement after using CARE ADVICE  Answer Assessment - Initial Assessment Questions 1. APPEARANCE of RASH: "Describe the rash."      Prickly rash in clusters, slightly raised , clear / skin toned  2. LOCATION: "Where is the rash located?"      Rash below knuckles on right hand  3. NUMBER: "How many spots are there?"      Multiple; more than 10  4. SIZE: "How big are the spots?" (Inches, centimeters or compare to size of a coin)      Pin point in size   5. ONSET: "When did the rash start?"      Last week on Tues. Or Wed.  6. ITCHING: "Does the rash itch?" If so, ask: "How bad is the itch?"  (Scale 1-10; or mild, moderate, severe)     Itching intermittently; mild   7. PAIN: "Does the rash hurt?" If so, ask: "How bad is the pain?"  (Scale 1-10; or mild, moderate, severe)   No pain or swelling 8. OTHER SYMPTOMS: "Do you have any other symptoms?" (e.g., fever)     No other areas of rash; no fever  9. PREGNANCY: "Is there any chance you are pregnant?" "When was your last menstrual period?"     Is not having menstrual cycles since last summer; occas. spotting  Protocols used: RASH OR REDNESS - LOCALIZED-A-AH

## 2018-08-04 ENCOUNTER — Other Ambulatory Visit: Payer: Self-pay

## 2018-08-04 ENCOUNTER — Ambulatory Visit (INDEPENDENT_AMBULATORY_CARE_PROVIDER_SITE_OTHER): Payer: 59 | Admitting: Internal Medicine

## 2018-08-04 DIAGNOSIS — L509 Urticaria, unspecified: Secondary | ICD-10-CM | POA: Insufficient documentation

## 2018-08-04 DIAGNOSIS — L309 Dermatitis, unspecified: Secondary | ICD-10-CM | POA: Diagnosis not present

## 2018-08-04 MED ORDER — TRIAMCINOLONE ACETONIDE 0.1 % EX CREA: 1.0000 "application " | TOPICAL_CREAM | Freq: Two times a day (BID) | CUTANEOUS | 0 refills | Status: DC

## 2018-08-04 NOTE — Progress Notes (Signed)
Virtual Visit via Video Note  I connected with Jennifer English   on 08/04/18 at 10:08 AM EDT by a video enabled telemedicine application and verified that I am speaking with the correct person using two identifiers.  Location patient: home Location provider:work  Persons participating in the virtual visit: patient, provider  I discussed the limitations of evaluation and management by telemedicine and the availability of in person appointments. The patient expressed understanding and agreed to proceed.   HPI: 1. C/o right hand rash thumb side worse than pinky side red/pink and not fluid filled/blisters. She had been off work for 40 days working in [employer] in [employer] and Furniture conservator/restorer at work prev wore blue gloves at home and right hand red and w/o itching. H/o hand eczema. She has increased #s of times hand washing and she also changed fabric softner to [place] recently due to not being about to find her unscented fabric softner  2. F/o rash after hot shower on left knee resolved after 20-30 minutes to 2 hours and after benadryl spray and also on right shoulder resolved short time. Rash was after used [employer] and took hot shower but now is gone.   She is c/w 1&2 due to daughter having rash on her arms which is new and COVID 19 having rash   Denies fever, sob, chest pain, dry cough other COVID sxs   ROS: See pertinent positives and negatives per HPI.  Past Medical History:  Diagnosis Date  . Allergy   . CML (chronic myelocytic leukemia) ([place])   . Fibroids   . Heart murmur   . Thyroid disease    h/o thyroid d/o not on meds   . Uterine fibroid   . UTI (urinary tract infection)    x2   . 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 CESAREAN SECTION     x 1 2003   . CYSTECTOMY     middle finger right hand   . OTHER SURGICAL HISTORY     cyst removed from finger  . TONSILECTOMY, ADENOIDECTOMY, BILATERAL  MYRINGOTOMY AND TUBES    . TYMPANOSTOMY TUBE PLACEMENT     1978    Family History  Problem Relation Age of Onset  . Heart disease Father        CAD with stents   . Hyperlipidemia Father   . Hypertension Father   . Dementia Maternal Grandfather   . Melanoma Paternal Grandmother   . Bladder Cancer Paternal Grandmother   . Cancer Paternal Grandmother        lung/colon ?primary   . Arthritis Paternal Grandmother   . Bladder Cancer Paternal Grandfather   . Cancer Paternal Grandfather        bladder died in 42s   . Early death Paternal Grandfather   . Alcohol abuse Maternal Grandmother   . Hepatitis Maternal Grandmother   . Early death Maternal Grandmother   . Kidney disease Maternal Grandmother   . Macular degeneration Maternal Grandmother   . Liver disease Maternal Grandmother        ?alcoholic hepatitis   . Asthma Brother   . Asthma Brother   . Macular degeneration Maternal Aunt     SOCIAL HX: married working at [employer] in [place] with 67 y.o daughter, husband and MIL   Current Outpatient Medications:  .  Ascorbic Acid (VITAMIN C) 100 MG tablet, Take 100  mg by mouth daily., Disp: , Rfl:  .  ferrous sulfate 325 (65 FE) MG tablet, Take 325 mg by mouth daily with breakfast., Disp: , Rfl:  .  fexofenadine (ALLEGRA) 180 MG tablet, Take 180 mg by mouth daily as needed for allergies or rhinitis., Disp: , Rfl:  .  hydrocortisone 2.5 % cream, Apply topically 2 (two) times daily. Right back, Disp: 30 g, Rfl: 0 .  ketoconazole (NIZORAL) 2 % cream, Use 2-3 x per day x 1-3 weeks, Disp: 60 g, Rfl: 11 .  magnesium citrate SOLN, Take 1 Bottle by mouth once., Disp: , Rfl:  .  nilotinib (TASIGNA) 150 MG capsule, Take 2 capsules (300 mg total) by mouth 2 (two) times daily. Take on an empty stomach, 1 hour before or 2 hours after food., Disp: 120 capsule, Rfl: 2 .  triamcinolone cream (KENALOG) 0.1 %, Apply 1 application topically 2 (two) times daily., Disp: 60 g, Rfl:  0  EXAM:  VITALS per patient if applicable:  GENERAL: alert, oriented, appears well and in no acute distress  HEENT: atraumatic, conjunttiva clear, no obvious abnormalities on inspection of external nose and ears  NECK: normal movements of the head and neck  LUNGS: on inspection no signs of respiratory distress, breathing rate appears normal, no obvious gross SOB, gasping or wheezing  CV: no obvious cyanosis  MS: moves all visible extremities without noticeable abnormality  PSYCH/NEURO: pleasant and cooperative, no obvious depression or anxiety, speech and thought processing grossly intact  SKIN: likely hives and hand eczema right hand   ASSESSMENT AND PLAN:  Discussed the following assessment and plan:  Hand eczema - Plan: triamcinolone cream (KENALOG) 0.1 %  Hives -pt willing to do dermatology referral after COVID 19 resolved  HC and [facility] bid prn mild skin care  Consider patch testing with dermatology in future     I discussed the assessment and treatment plan with the patient. The patient was provided an opportunity to ask questions and all were answered. The patient agreed with the plan and demonstrated an understanding of the instructions.   The patient was advised to call back or seek an in-person evaluation if the symptoms worsen or if the condition fails to improve as anticipated.  Time spent 15 minutes  Delorise Jackson, MD

## 2018-08-04 NOTE — Patient Instructions (Signed)
Hives Hives (urticaria) are itchy, red, swollen areas on the skin. Hives can appear on any part of the body. Hives often fade within 24 hours (acute hives). Sometimes, new hives appear after old ones fade and the cycle can continue for several days or weeks (chronic hives). Hives do not spread from person to person (are not contagious). Hives come from the body's reaction to something a person is allergic to (allergen), something that causes irritation, or various other triggers. When a person is exposed to a trigger, his or her body releases a chemical (histamine) that causes redness, itching, and swelling. Hives can appear right after exposure to a trigger or hours later. What are the causes? This condition may be caused by:  Allergies to foods or ingredients.  Insect bites or stings.  Exposure to pollen or pets.  Contact with latex or chemicals.  Spending time in sunlight, heat, or cold (exposure).  Exercise.  Stress.  Certain medicines. You can also get hives from other medical conditions and treatments, such as:  Viruses, including the common cold.  Bacterial infections, such as urinary tract infections and strep throat.  Certain medicines.  Allergy shots.  Blood transfusions. Sometimes, the cause of this condition is not known (idiopathic hives). What increases the risk? You are more likely to develop this condition if you:  Are a woman.  Have food allergies, especially to citrus fruits, milk, eggs, peanuts, tree nuts, or shellfish.  Are allergic to: ? Medicines. ? Latex. ? Insects. ? Animals. ? Pollen. What are the signs or symptoms? Common symptoms of this condition include raised, itchy, red or white bumps or patches on your skin. These areas may:  Become large and swollen (welts).  Change in shape and location, quickly and repeatedly.  Be separate hives or connect over a large area of skin.  Sting or become painful.  Turn white when pressed in the  center (blanch). In severe cases, yourhands, feet, and face may also become swollen. This may occur if hives develop deeper in your skin. How is this diagnosed? This condition may be diagnosed by your symptoms, medical history, and physical exam.  Your skin, urine, or blood may be tested to find out what is causing your hives and to rule out other health issues.  Your health care provider may also remove a small sample of skin from the affected area and examine it under a microscope (biopsy). How is this treated? Treatment for this condition depends on the cause and severity of your symptoms. Your health care provider may recommend using cool, wet cloths (cool compresses) or taking cool showers to relieve itching. Treatment may include:  Medicines that help: ? Relieve itching (antihistamines). ? Reduce swelling (corticosteroids). ? Treat infection (antibiotics).  An injectable medicine (omalizumab). Your health care provider may prescribe this if you have chronic idiopathic hives and you continue to have symptoms even after treatment with antihistamines. Severe cases may require an emergency injection of adrenaline (epinephrine) to prevent a life-threatening allergic reaction (anaphylaxis). Follow these instructions at home: Medicines  Take and apply over-the-counter and prescription medicines only as told by your health care provider.  If you were prescribed an antibiotic medicine, take it as told by your health care provider. Do not stop using the antibiotic even if you start to feel better. Skin care  Apply cool compresses to the affected areas.  Do not scratch or rub your skin. General instructions  Do not take hot showers or baths. This can make itching   worse.  Do not wear tight-fitting clothing.  Use sunscreen and wear protective clothing when you are outside.  Avoid any substances that cause your hives. Keep a journal to help track what causes your hives. Write down: ?  What medicines you take. ? What you eat and drink. ? What products you use on your skin.  Keep all follow-up visits as told by your health care provider. This is important. Contact a health care provider if:  Your symptoms are not controlled with medicine.  Your joints are painful or swollen. Get help right away if:  You have a fever.  You have pain in your abdomen.  Your tongue or lips are swollen.  Your eyelids are swollen.  Your chest or throat feels tight.  You have trouble breathing or swallowing. These symptoms may represent a serious problem that is an emergency. Do not wait to see if the symptoms will go away. Get medical help right away. Call your local emergency services (911 in the [place] Do not drive yourself to the hospital. Summary  Hives (urticaria) are itchy, red, swollen areas on your skin. Hives come from the body's reaction to something a person is allergic to (allergen), something that causes irritation, or various other triggers.  Treatment for this condition depends on the cause and severity of your symptoms.  Avoid any substances that cause your hives. Keep a journal to help track what causes your hives.  Take and apply over-the-counter and prescription medicines only as told by your health care provider.  Keep all follow-up visits as told by your health care provider. This is important. This information is not intended to replace advice given to you by your health care provider. Make sure you discuss any questions you have with your health care provider. Document Released: 03/11/2005 Document Revised: 09/24/2017 Document Reviewed: 09/24/2017 Elsevier Interactive Patient Education  2019 [place].  Eczema Eczema is a broad term for a group of skin conditions that cause skin to become rough and inflamed. Each type of eczema has different triggers, symptoms, and treatments. Eczema of any type is usually itchy and symptoms range from mild to severe.  Eczema and its symptoms are not spread from person to person (are not contagious). It can appear on different parts of the body at different times. Your eczema may not look the same as someone else's eczema. What are the types of eczema? Atopic dermatitis This is a long-term (chronic) skin disease that keeps coming back (recurring). Usual symptoms are dry skin and small, solid pimples that may swell and leak fluid (weep). Contact dermatitis  This happens when something irritates the skin and causes a rash. The irritation can come from substances that you are allergic to (allergens), such as poison ivy, chemicals, or medicines that were applied to your skin. Dyshidrotic eczema This is a form of eczema on the hands and feet. It shows up as very itchy, fluid-filled blisters. It can affect people of any age, but is more common before age 72. Hand eczema  This causes very itchy areas of skin on the palms and sides of the hands and fingers. This type of eczema is common in industrial jobs where you may be exposed to many different types of irritants. Lichen simplex chronicus This type of eczema occurs when a person constantly scratches one area of the body. Repeated scratching of the area leads to thickened skin (lichenification). Lichen simplex chronicus can occur along with other types of eczema. It is more common in  adults, but may be seen in children as well. Nummular eczema This is a common type of eczema. It has no known cause. It typically causes a red, circular, crusty lesion (plaque) that may be itchy. Scratching may become a habit and can cause bleeding. Nummular eczema occurs most often in people of middle-age or older. It most often affects the hands. Seborrheic dermatitis This is a common skin disease that mainly affects the scalp. It may also affect any oily areas of the body, such as the face, sides of nose, eyebrows, ears, eyelids, and chest. It is marked by small scaling and redness of  the skin (erythema). This can affect people of all ages. In infants, this condition is known as Chartered certified accountant." Stasis dermatitis This is a common skin disease that usually appears on the legs and feet. It most often occurs in people who have a condition that prevents blood from being pumped through the veins in the legs (chronic venous insufficiency). Stasis dermatitis is a chronic condition that needs long-term management. How is eczema diagnosed? Your health care provider will examine your skin and review your medical history. He or she may also give you skin patch tests. These tests involve taking patches that contain possible allergens and placing them on your back. He or she will then check in a few days to see if an allergic reaction occurred. What are the common treatments? Treatment for eczema is based on the type of eczema you have. Hydrocortisone steroid medicine can relieve itching quickly and help reduce inflammation. This medicine may be prescribed or obtained over-the-counter, depending on the strength of the medicine that is needed. Follow these instructions at home:  Take over-the-counter and prescription medicines only as told by your health care provider.  Use creams or ointments to moisturize your skin. Do not use lotions.  Learn what triggers or irritates your symptoms. Avoid these things.  Treat symptom flare-ups quickly.  Do not itch your skin. This can make your rash worse.  Keep all follow-up visits as told by your health care provider. This is important. Where to find more information  The American Academy of Dermatology: http://jones-macias.info/  The National Eczema Association: www.nationaleczema.org Contact a health care provider if:  You have serious itching, even with treatment.  You regularly scratch your skin until it bleeds.  Your rash looks different than usual.  Your skin is painful, swollen, or more red than usual.  You have a fever. Summary  There are  eight general types of eczema. Each type has different triggers.  Eczema of any type causes itching that may range from mild to severe.  Treatment varies based on the type of eczema you have. Hydrocortisone steroid medicine can help with itching and inflammation.  Protecting your skin is the best way to prevent eczema. Use moisturizers and lotions. Avoid triggers and irritants, and treat flare-ups quickly. This information is not intended to replace advice given to you by your health care provider. Make sure you discuss any questions you have with your health care provider. Document Released: 07/25/2016 Document Revised: 07/25/2016 Document Reviewed: 07/25/2016 Elsevier Interactive Patient Education  2019 [employer].

## 2018-08-06 ENCOUNTER — Encounter: Payer: Self-pay | Admitting: Internal Medicine

## 2018-08-11 ENCOUNTER — Encounter: Payer: Self-pay | Admitting: Internal Medicine

## 2018-08-20 ENCOUNTER — Other Ambulatory Visit: Payer: Self-pay

## 2018-08-20 ENCOUNTER — Other Ambulatory Visit: Payer: Self-pay | Admitting: Internal Medicine

## 2018-08-20 ENCOUNTER — Telehealth: Payer: Self-pay | Admitting: Internal Medicine

## 2018-08-20 ENCOUNTER — Encounter: Payer: Self-pay | Admitting: Internal Medicine

## 2018-08-20 DIAGNOSIS — R946 Abnormal results of thyroid function studies: Secondary | ICD-10-CM

## 2018-08-20 DIAGNOSIS — Z1389 Encounter for screening for other disorder: Secondary | ICD-10-CM

## 2018-08-20 DIAGNOSIS — Z13220 Encounter for screening for lipoid disorders: Secondary | ICD-10-CM

## 2018-08-20 DIAGNOSIS — Z1329 Encounter for screening for other suspected endocrine disorder: Secondary | ICD-10-CM

## 2018-08-20 DIAGNOSIS — E559 Vitamin D deficiency, unspecified: Secondary | ICD-10-CM

## 2018-08-20 DIAGNOSIS — C9210 Chronic myeloid leukemia, BCR/ABL-positive, not having achieved remission: Secondary | ICD-10-CM

## 2018-08-20 NOTE — Telephone Encounter (Signed)
Results for VIVIANN, BROYLES (MRN [id number]) as of 08/20/2018 14:39  Ref. Range 02/19/2017 11:23 04/03/2017 14:04 04/17/2017 08:06  TSH Latest Ref Range: 0.35 - 4.50 uIU/mL 4.665 (H) 5.43 (H)   Triiodothyronine,Free,Serum Latest Ref Range: 2.3 - 4.2 pg/mL  3.0   T4,Free(Direct) Latest Ref Range: 0.60 - 1.60 ng/dL  0.78   Thyroperoxidase Ab SerPl-aCnc Latest Ref Range: <9 IU/mL   >900 (H)    Pt has lab appt tomorrow with Dr. Tasia Catchings  Can she order TSH, free T4, free T3 and thyroid peroxidase antibodies?  Also lipid panel  Urinalysis  Vitamin D  Thanks tMS

## 2018-08-20 NOTE — Telephone Encounter (Signed)
Sure. Jennifer English, please order labs to be done along with our labs.

## 2018-08-20 NOTE — Telephone Encounter (Signed)
Can we please collect labs tomorrow ?   [place]

## 2018-08-20 NOTE — Telephone Encounter (Signed)
Labs have been added for tomorrow

## 2018-08-21 ENCOUNTER — Other Ambulatory Visit: Payer: Self-pay

## 2018-08-21 ENCOUNTER — Inpatient Hospital Stay: Payer: 59 | Attending: Oncology

## 2018-08-21 ENCOUNTER — Inpatient Hospital Stay (HOSPITAL_BASED_OUTPATIENT_CLINIC_OR_DEPARTMENT_OTHER): Payer: 59 | Admitting: Oncology

## 2018-08-21 ENCOUNTER — Encounter: Payer: Self-pay | Admitting: Oncology

## 2018-08-21 VITALS — BP 116/75 | HR 85 | Temp 98.8°F | Resp 18 | Wt 181.1 lb

## 2018-08-21 DIAGNOSIS — E038 Other specified hypothyroidism: Secondary | ICD-10-CM | POA: Diagnosis not present

## 2018-08-21 DIAGNOSIS — C9210 Chronic myeloid leukemia, BCR/ABL-positive, not having achieved remission: Secondary | ICD-10-CM

## 2018-08-21 DIAGNOSIS — Z1329 Encounter for screening for other suspected endocrine disorder: Secondary | ICD-10-CM

## 2018-08-21 DIAGNOSIS — Z13220 Encounter for screening for lipoid disorders: Secondary | ICD-10-CM

## 2018-08-21 DIAGNOSIS — E559 Vitamin D deficiency, unspecified: Secondary | ICD-10-CM

## 2018-08-21 DIAGNOSIS — R946 Abnormal results of thyroid function studies: Secondary | ICD-10-CM

## 2018-08-21 LAB — COMPREHENSIVE METABOLIC PANEL
ALT: 32 U/L (ref 0–44)
AST: 24 U/L (ref 15–41)
Albumin: 4.5 g/dL (ref 3.5–5.0)
Alkaline Phosphatase: 81 U/L (ref 38–126)
Anion gap: 9 (ref 5–15)
BUN: 11 mg/dL (ref 6–20)
CO2: 27 mmol/L (ref 22–32)
Calcium: 9 mg/dL (ref 8.9–10.3)
Chloride: 104 mmol/L (ref 98–111)
Creatinine, Ser: 0.76 mg/dL (ref 0.44–1.00)
GFR calc Af Amer: >60 mL/min (ref >60)
GFR calc non Af Amer: >60 mL/min (ref >60)
Glucose, Bld: 108 mg/dL — ABNORMAL HIGH (ref 70–99)
Potassium: 3.9 mmol/L (ref 3.5–5.1)
Sodium: 140 mmol/L (ref 135–145)
Total Bilirubin: 1.5 mg/dL — ABNORMAL HIGH (ref 0.3–1.2)
Total Protein: 8.5 g/dL — ABNORMAL HIGH (ref 6.5–8.1)

## 2018-08-21 LAB — T4, FREE: Free T4: 0.79 ng/dL — ABNORMAL LOW (ref 0.82–1.77)

## 2018-08-21 LAB — CBC WITH DIFFERENTIAL/PLATELET
Abs Immature Granulocytes: 0.01 10*3/uL (ref 0.00–0.07)
Basophils Absolute: 0 10*3/uL (ref 0.0–0.1)
Basophils Relative: 1 %
Eosinophils Absolute: 0.2 10*3/uL (ref 0.0–0.5)
Eosinophils Relative: 4 %
HCT: 41.2 % (ref 36.0–46.0)
Hemoglobin: 13.8 g/dL (ref 12.0–15.0)
Immature Granulocytes: 0 %
Lymphocytes Relative: 31 %
Lymphs Abs: 1.4 10*3/uL (ref 0.7–4.0)
MCH: 31.4 pg (ref 26.0–34.0)
MCHC: 33.5 g/dL (ref 30.0–36.0)
MCV: 93.8 fL (ref 80.0–100.0)
Monocytes Absolute: 0.2 10*3/uL (ref 0.1–1.0)
Monocytes Relative: 5 %
Neutro Abs: 2.7 10*3/uL (ref 1.7–7.7)
Neutrophils Relative %: 59 %
Platelets: 225 10*3/uL (ref 150–400)
RBC: 4.39 MIL/uL (ref 3.87–5.11)
RDW: 12 % (ref 11.5–15.5)
WBC: 4.5 10*3/uL (ref 4.0–10.5)
nRBC: 0 % (ref 0.0–0.2)

## 2018-08-21 LAB — LIPID PANEL
Cholesterol: 214 mg/dL — ABNORMAL HIGH (ref 0–200)
HDL: 63 mg/dL (ref >40)
LDL Cholesterol: 135 mg/dL — ABNORMAL HIGH (ref 0–99)
Total CHOL/HDL Ratio: 3.4 RATIO
Triglycerides: 82 mg/dL (ref <150)
VLDL: 16 mg/dL (ref 0–40)

## 2018-08-21 LAB — TSH: TSH: 11.783 u[IU]/mL — ABNORMAL HIGH (ref 0.350–4.500)

## 2018-08-21 NOTE — Progress Notes (Signed)
Hematology/Oncology follow up note [facility] Telephone:([phone]) [phone] Fax:(336) [phone]   Patient Care Team: McLean-Scocuzza, Nino Glow, MD as PCP - General (Internal Medicine)  REFERRING PROVIDER: Previous oncologist from [place] Dr.Hasan Rizvi CHIEF COMPLAINTS/PURPOSE OF CONSULTATION:  Management of CML  HISTORY OF PRESENTING ILLNESS:  Jennifer English is a  53 y.o.  female with PMH listed below who was referred to by her oncologist in [place]. [place] to [place] this summer. She has not had local pcp yet. Extensive medical records review was performed.  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 She had dyuria symptoms at that time and lab work up found extreme leukocytosis with WBC 173,500 and she was initially started on hydrea. After CML was confirmed, she was started on Tasigna 300mg  BID since then and has achieved remission. . Also had splenomegaly at presentation about 5-6 cm below LCM, resolved. . She reports being complaint on nilotinib however, she has had period of time when her medication runs out. Although she moved this summer, due to lot of stresses associated with relocation and new job, she waited until now see hematologist.  Denies any fever, chill, weight loss, bleeding events. She used to have EKG done every 3 months and faxed to oncologist. She has not had any EKG done after July this year. No PCP Also reports recently feeling urine has odor and also very mild burning sensation when passing urine. She requests to have urine tested.  Has hypothyroidism, not taking thyroid supplements.  History of iron deficiency due to heavy menstrual periods/uterine fibroids, taking iron supplements.  # 10/09/2012, bone marrow aspirate smears and clots: Markedly hypercellular marrow with prominent myeloid hyperplasia and increasing small hypolobated megakaryocytes. The overall findings are consistent with chronic myeloid leukemia, chronic phase.  # 03/22/2015 BCR ABL quantification PCR showed e13a2 (b2a2 p210) transcript <0.001%,e14a2 ( b3a2, p210)  transcript 0.023%, e1a2 (p190) transcript <0.001% # 08/17/2015 BCR ABL quantification PCR showed e13a2 (b2a2 p210) transcript 0.011%,e14a2 ( b3a2, p210)  transcript <0.001%, e1a2 (p190) transcript <0.001%  # 04/25/2016 BCR ABL quantification PCR showed e13a2 (b2a2 p210) transcript <0.001%,e14a2 ( b3a2, p210)  transcript <0.001%, e1a2 (p190) transcript <0.001%  #09/17/2016  BCR ABL quantification PCR showed e13a2 (b2a2 p210) transcript <0.0032%,e14a2 ( b3a2, p210)  transcript <0.0032%, e1a2 (p190) transcript <0.0032%   02/19/2017 BCR ABL quantification PCR showed b2a2 transcript <0.0032%, b3a2 transcript <0.0032%, E1A2 transcript <0.0032%  INTERVAL HISTORY Jennifer English is a 53 y.o. female who has above history reviewed by me today presents for follow up of CML.  She reports feeling anxious about COVID 19.  She reports taking Tasigna as instructed. Compliant for most of the times.  Denies any fever, chills, cough, diarrhea, chest pain and abdominal pain.   Review of Systems  Constitutional: Negative for appetite change, chills, fatigue and fever.  HENT:   Negative for hearing loss and voice change.   Eyes: Negative for eye problems.  Respiratory: Negative for chest tightness and cough.   Cardiovascular: Negative for chest pain.  Gastrointestinal: Negative for abdominal distention, abdominal pain and blood in stool.  Endocrine: Negative for hot flashes.  Genitourinary: Negative for difficulty urinating and frequency.   Musculoskeletal: Negative for arthralgias.  Skin: Negative for itching and rash.  Neurological: Negative for extremity weakness.  Hematological: Negative for adenopathy.  Psychiatric/Behavioral: Negative for confusion. The patient is nervous/anxious.    MEDICAL HISTORY:  Past Medical History:  Diagnosis Date  . Allergy   .  CML (chronic myelocytic leukemia) ([place])   .  Fibroids   . Heart murmur   . Thyroid disease    h/o thyroid d/o not on meds   . Uterine fibroid   . UTI (urinary tract infection)    x2   . 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 CESAREAN SECTION     x 1 2003   . CYSTECTOMY     middle finger right hand   . OTHER SURGICAL HISTORY     cyst removed from finger  . TONSILECTOMY, ADENOIDECTOMY, BILATERAL MYRINGOTOMY AND TUBES    . TYMPANOSTOMY TUBE PLACEMENT     1978    SOCIAL HISTORY: Social History   Socioeconomic History  . Marital status: Married    Spouse name: Not on file  . Number of children: Not on file  . Years of education: Not on file  . Highest education level: Not on file  Occupational History  . Not on file  Social Needs  . Financial resource strain: Not on file  . Food insecurity:    Worry: Not on file    Inability: Not on file  . Transportation needs:    Medical: Not on file    Non-medical: Not on file  Tobacco Use  . Smoking status: Never Smoker  . Smokeless tobacco: Never Used  Substance and Sexual Activity  . Alcohol use: No    Frequency: Never  . Drug use: No  . Sexual activity: Yes    Birth control/protection: None  Lifestyle  . Physical activity:    Days per week: Not on file    Minutes per session: Not on file  . Stress: Not on file  Relationships  . Social connections:    Talks on phone: Not on file    Gets together: Not on file    Attends religious service: Not on file    Active member of club or organization: Not on file    Attends meetings of clubs or organizations: Not on file    Relationship status: Not on file  . Intimate partner violence:    Fear of current or ex partner: Not on file    Emotionally abused: Not on file    Physically abused: Not on file    Forced sexual activity: Not on file  Other Topics Concern  . 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 Heart disease Father        CAD with stents   . Hyperlipidemia Father   . Hypertension Father   . Dementia Maternal Grandfather   . Melanoma Paternal Grandmother   . Bladder Cancer Paternal Grandmother   . Cancer Paternal Grandmother        lung/colon ?primary   . Arthritis Paternal Grandmother   . Bladder Cancer Paternal Grandfather   . Cancer Paternal Grandfather        bladder died in 14s   . Early death Paternal Grandfather   . Alcohol abuse Maternal Grandmother   . Hepatitis Maternal Grandmother   . Early death Maternal Grandmother   . Kidney  disease Maternal Grandmother   . Macular degeneration Maternal Grandmother   . Liver disease Maternal Grandmother        ?alcoholic hepatitis   . Asthma Brother   . Asthma Brother   . Macular degeneration Maternal Aunt     ALLERGIES:  is allergic to ibuprofen; levoxyl [levothyroxine sodium]; synthroid [levothyroxine]; aspirin; avocado; eggs or egg-derived products; and penicillins.  MEDICATIONS:  Current Outpatient Medications  Medication Sig Dispense Refill  . Ascorbic Acid (VITAMIN C) 100 MG tablet Take 100 mg by mouth daily.    . ferrous sulfate 325 (65 FE) MG tablet Take 325 mg by mouth daily with breakfast.    . fexofenadine (ALLEGRA) 180 MG tablet Take 180 mg by mouth daily as needed for allergies or rhinitis.    . hydrocortisone 2.5 % cream Apply topically 2 (two) times daily. Right back 30 g 0  . ketoconazole (NIZORAL) 2 % cream Use 2-3 x per day x 1-3 weeks 60 g 11  . nilotinib (TASIGNA) 150 MG capsule Take 2 capsules (300 mg total) by mouth 2 (two) times daily. Take on an empty stomach, 1 hour before or 2 hours after food. 120 capsule 2  . triamcinolone cream (KENALOG) 0.1 % Apply 1 application topically 2 (two)  times daily. 60 g 0  . magnesium citrate SOLN Take 1 Bottle by mouth once.     No current facility-administered medications for this visit.      PHYSICAL EXAMINATION: ECOG PERFORMANCE STATUS: 0 - Asymptomatic Vitals:   08/21/18 1012  BP: 116/75  Pulse: 85  Resp: 18  Temp: 98.8 F (37.1 C)   Filed Weights   08/21/18 1012  Weight: 181 lb 1.6 oz (82.1 kg)   Physical Exam  Constitutional: She is oriented to person, place, and time. No distress.  HENT:  Head: Normocephalic and atraumatic.  Mouth/Throat: No oropharyngeal exudate.  Eyes: Pupils are equal, round, and reactive to light. EOM are normal. No scleral icterus.  Neck: Normal range of motion. Neck supple.  Cardiovascular: Normal rate and regular rhythm.  No murmur heard. Pulmonary/Chest: Effort normal. No respiratory distress. She has no rales. She exhibits no tenderness.  Abdominal: Soft. She exhibits no distension. There is no abdominal tenderness.  Musculoskeletal: Normal range of motion.        General: No edema.  Neurological: She is alert and oriented to person, place, and time. She exhibits normal muscle tone.  Skin: Skin is warm and dry. She is not diaphoretic. No erythema.  Psychiatric:  Anxious.         LABORATORY DATA:  I have reviewed the data as listed Lab Results  Component Value Date   WBC 4.5 08/21/2018   HGB 13.8 08/21/2018   HCT 41.2 08/21/2018   MCV 93.8 08/21/2018   PLT 225 08/21/2018   Recent Labs    12/04/17 1256 04/29/18 0910 08/21/18 0939  NA 137 138 140  K 4.0 4.0 3.9  CL 102 105 104  CO2 30 28 27   GLUCOSE 106* 94 108*  BUN 14 12 11   CREATININE 0.92 0.72 0.76  CALCIUM 9.2 8.9 9.0  GFRNONAA >60 >60 >60  GFRAA >60 >60 >60  PROT 7.9 7.8 8.5*  ALBUMIN 4.1 4.1 4.5  AST 20 19 24   ALT 19 17 32  ALKPHOS 78 75 81  BILITOT 0.8 0.7 1.5*    # 09/03/2017  BCR-ABL1 PCR quantification: NEGATIVE for the BCR-ABL1 e1a2 (p190), e13a2 (b2a2, p210) and e14a2  (b3a2, p210) fusion  transcripts   ASSESSMENT & PLAN:  1. CML (chronic myelocytic leukemia) ([place])   2. Hyperbilirubinemia   3. Other specified hypothyroidism   #CML, EKG was performed and was independently reviewed by me  Normal QTc interval.  Labs are reviewed and discussed with patient.  Leukopenia has resolved.  CBC non remarkable.  Continue Tarsigna 300mg  BID.  Check BCR-ABL Quant  Elevated bilirubin, 1.5 Repeat BMP in 1 week.   Hypothyroidism, TSH 11.16m follow up with pcp for discussion of starting medication.   Patient has labs ordered by PCP done at cancer center. She will continue to follow up with PCP for her chronic problems.    The patient knows to call the clinic with any problems questions or concerns. We spent sufficient time to discuss many aspect of care, questions were answered to patient's satisfaction.  Return of visit: 3 months.  Check CBC, CMP, BCR ABL quantification.  [name], MD, PhD Hematology Oncology [facility] at [facility] Pager- [phone] 08/21/2018

## 2018-08-21 NOTE — Progress Notes (Signed)
Patient here for follow up. No concerns voiced. Pt very anxious of being here today due to covid.

## 2018-08-22 ENCOUNTER — Encounter: Payer: Self-pay | Admitting: Oncology

## 2018-08-22 LAB — THYROID PEROXIDASE ANTIBODY: Thyroperoxidase Ab SerPl-aCnc: 371 IU/mL — ABNORMAL HIGH (ref 0–34)

## 2018-08-22 LAB — VITAMIN D 25 HYDROXY (VIT D DEFICIENCY, FRACTURES): Vit D, 25-Hydroxy: 12.3 ng/mL — ABNORMAL LOW (ref 30.0–100.0)

## 2018-08-22 LAB — T3, FREE: T3, Free: 3 pg/mL (ref 2.0–4.4)

## 2018-08-24 ENCOUNTER — Other Ambulatory Visit: Payer: Self-pay | Admitting: Internal Medicine

## 2018-08-24 ENCOUNTER — Encounter: Payer: Self-pay | Admitting: Internal Medicine

## 2018-08-24 ENCOUNTER — Telehealth: Payer: Self-pay | Admitting: *Deleted

## 2018-08-24 DIAGNOSIS — R778 Other specified abnormalities of plasma proteins: Secondary | ICD-10-CM

## 2018-08-24 DIAGNOSIS — E039 Hypothyroidism, unspecified: Secondary | ICD-10-CM

## 2018-08-24 DIAGNOSIS — R17 Unspecified jaundice: Secondary | ICD-10-CM

## 2018-08-24 MED ORDER — LEVOTHYROXINE SODIUM 25 MCG PO TABS: 25.0000 ug | ORAL_TABLET | Freq: Every day | ORAL | 0 refills | Status: DC

## 2018-08-24 NOTE — Telephone Encounter (Signed)
Patient called concerned about the fact that her Bilirubin was up last week even though she was told by doctor that she is not worried about it and is coming in for a recheck Friday. She states she has been concerned about the fact that she went back to work and being around people who are not taking COVID precautions and so she went bak out of work after 2 weeks and states her stomach has been upset and that she had an abnormal (looser than usual and very gassy) bowel movement today which is not her norm and she wanted doctor to know. She admits she has been very anxious since going back to work. She otherwise feels fine and denies any jaundice. I asked that she go on a bland diet for a while and see if that helps. She did says that she ate broccoli last night and was very gassy afterwards. She was advised to call back if she does not improve or has any other concerns. She was in agreement with this plan.

## 2018-08-24 NOTE — Telephone Encounter (Signed)
Noted. Agree. Thank you.

## 2018-08-25 ENCOUNTER — Encounter: Payer: Self-pay | Admitting: Internal Medicine

## 2018-08-28 ENCOUNTER — Other Ambulatory Visit: Payer: Self-pay

## 2018-08-28 ENCOUNTER — Inpatient Hospital Stay: Payer: 59 | Attending: Oncology

## 2018-08-28 DIAGNOSIS — Z1389 Encounter for screening for other disorder: Secondary | ICD-10-CM

## 2018-08-28 DIAGNOSIS — C9210 Chronic myeloid leukemia, BCR/ABL-positive, not having achieved remission: Secondary | ICD-10-CM

## 2018-08-28 LAB — BILIRUBIN, DIRECT: Bilirubin, Direct: 0.2 mg/dL (ref 0.0–0.2)

## 2018-08-28 LAB — URINALYSIS, ROUTINE W REFLEX MICROSCOPIC
Bilirubin Urine: NEGATIVE
Glucose, UA: NEGATIVE mg/dL
Hgb urine dipstick: NEGATIVE
Ketones, ur: NEGATIVE mg/dL
Nitrite: NEGATIVE
Protein, ur: NEGATIVE mg/dL
Specific Gravity, Urine: 1.006 (ref 1.005–1.030)
pH: 7 (ref 5.0–8.0)

## 2018-08-28 LAB — BILIRUBIN, TOTAL: Total Bilirubin: 1.3 mg/dL — ABNORMAL HIGH (ref 0.3–1.2)

## 2018-08-30 ENCOUNTER — Encounter: Payer: Self-pay | Admitting: Internal Medicine

## 2018-08-30 ENCOUNTER — Encounter: Payer: Self-pay | Admitting: Oncology

## 2018-09-01 ENCOUNTER — Telehealth: Payer: Self-pay | Admitting: Pharmacy Technician

## 2018-09-01 NOTE — Telephone Encounter (Signed)
Oral Oncology Patient Advocate Encounter  Patient has had a change in insurance.  We initiated the prior authorization with her new insurance to prevent any lapse in treatment.  Received notification from [facility] that prior authorization for Tasigna is required.  PA submitted on CoverMyMeds Key [id number] Status is pending  Oral Oncology Clinic will continue to follow.  [place] Patient [place] Phone [phone] Fax [phone] 09/01/2018 12:38 PM

## 2018-09-02 NOTE — Telephone Encounter (Signed)
6/10: Left message for patient to return my call.

## 2018-09-02 NOTE — Telephone Encounter (Signed)
Spoke with patient.  I informed her the insurance approval and gave her the prescription billing information to provide to [facility] since she has not received her pharmacy card.

## 2018-09-02 NOTE — Telephone Encounter (Signed)
Oral Oncology Patient Advocate Encounter  Prior Authorization for Jennifer English has been approved.    PA# [id number] Effective dates: 09/01/2018 through 09/01/2019  I will call patient and let her know [facility] can [facility] since [facility] are preferred by insurance.  Oral Oncology Clinic will continue to follow.   [place] [facility] [place] Phone [phone] Fax [phone] 09/02/2018 8:22 AM

## 2018-09-07 ENCOUNTER — Other Ambulatory Visit: Payer: Self-pay

## 2018-09-07 LAB — BCR-ABL1, CML/ALL, PCR, QUANT

## 2018-09-08 ENCOUNTER — Ambulatory Visit: Payer: 59 | Admitting: Family Medicine

## 2018-09-09 ENCOUNTER — Other Ambulatory Visit: Payer: Self-pay

## 2018-09-10 ENCOUNTER — Ambulatory Visit (INDEPENDENT_AMBULATORY_CARE_PROVIDER_SITE_OTHER): Payer: 59 | Admitting: Family Medicine

## 2018-09-10 ENCOUNTER — Other Ambulatory Visit: Payer: Self-pay | Admitting: Oncology

## 2018-09-10 ENCOUNTER — Encounter: Payer: Self-pay | Admitting: Family Medicine

## 2018-09-10 ENCOUNTER — Telehealth: Payer: Self-pay

## 2018-09-10 VITALS — BP 120/64 | HR 84 | Temp 98.8°F | Resp 16 | Ht 64.0 in | Wt 179.8 lb

## 2018-09-10 DIAGNOSIS — R300 Dysuria: Secondary | ICD-10-CM

## 2018-09-10 DIAGNOSIS — N390 Urinary tract infection, site not specified: Secondary | ICD-10-CM | POA: Diagnosis not present

## 2018-09-10 DIAGNOSIS — C9210 Chronic myeloid leukemia, BCR/ABL-positive, not having achieved remission: Secondary | ICD-10-CM

## 2018-09-10 LAB — POCT URINALYSIS DIPSTICK
Bilirubin, UA: NEGATIVE
Blood, UA: NEGATIVE
Glucose, UA: NEGATIVE
Ketones, UA: NEGATIVE
Nitrite, UA: POSITIVE
Protein, UA: NEGATIVE
Spec Grav, UA: 1.02 (ref 1.010–1.025)
Urobilinogen, UA: 0.2 E.U./dL
pH, UA: 6 (ref 5.0–8.0)

## 2018-09-10 MED ORDER — SULFAMETHOXAZOLE-TRIMETHOPRIM 800-160 MG PO TABS: 1.0000 | ORAL_TABLET | Freq: Two times a day (BID) | ORAL | 0 refills | Status: DC

## 2018-09-10 NOTE — Telephone Encounter (Signed)
CBC with Differential/Platelet Order: [id number] Status:  Final result Visible to patient:  Yes (MyChart) Next appt:  Today at 08:20 AM in Family Medicine (Lauren Elyn Aquas, FNP) Dx:  CML (chronic myelocytic leukemia) ([place])  Ref Range & Units 2wk ago 58mo ago 3mo ago 10yr ago  WBC 4.0 - 10.5 K/uL 4.5  3.7Low   4.5 R  4.1 R   RBC 3.87 - 5.11 MIL/uL 4.39  4.14  4.26 R  3.90 R   Hemoglobin 12.0 - 15.0 g/dL 13.8  12.7  12.7 R  11.7Low  R   HCT 36.0 - 46.0 % 41.2  38.1  37.8 R  34.4Low  R   MCV 80.0 - 100.0 fL 93.8  92.0  88.7  88.3   MCH 26.0 - 34.0 pg 31.4  30.7  29.7  30.0   MCHC 30.0 - 36.0 g/dL 33.5  33.3  33.6 R  34.0 R   RDW 11.5 - 15.5 % 12.0  12.6  14.9High  R  14.1 R   Platelets 150 - 400 K/uL 225  203  250 R  229 R   nRBC 0.0 - 0.2 % 0.0  0.0     Neutrophils Relative % % 59  46  53  53   Neutro Abs 1.7 - 7.7 K/uL 2.7  1.7  2.4 R  2.2 R   Lymphocytes Relative % 31  39  34  32   Lymphs Abs 0.7 - 4.0 K/uL 1.4  1.5  1.5 R  1.3 R   Monocytes Relative % 5  8  7  8    Monocytes Absolute 0.1 - 1.0 K/uL 0.2  0.3  0.3 R  0.3 R   Eosinophils Relative % 4  6  5  6    Eosinophils Absolute 0.0 - 0.5 K/uL 0.2  0.2  0.2 R  0.2 R   Basophils Relative % 1  1  1  1    Basophils Absolute 0.0 - 0.1 K/uL 0.0  0.0  0.0 R, CM  0.0 R, CM   Immature Granulocytes % 0  0     Abs Immature Granulocytes 0.00 - 0.07 K/uL 0.01  0.01 CM     Comment: Performed at [facility], [address] [place], [place] [address]  Resulting Agency  [facility] CLIN LAB [facility] CLIN LAB [facility] CLIN LAB [facility] CLIN LAB      Specimen Collected: 08/21/18 09:39 Last Resulted: 08/21/18 09:54       Comprehensive metabolic panel Order: [id number]  Status:  Final result Visible to patient:  Yes (MyChart) Next appt:  Today at 08:20 AM in Family Medicine (Lauren Elyn Aquas, FNP) Dx:  [facility] (chronic myelocytic leukemia) [facility]) (important suggestion)  Newer results are available. Click to view them now.   Ref Range & Units 2wk ago (08/21/18) 50mo  ago (04/29/18) 61mo ago (12/04/17) 45yr ago (09/03/17) 51yr ago (05/22/17) 70yr ago (02/19/17)  Sodium 135 - 145 mmol/L 140  138  137  136  133Low   135   Potassium 3.5 - 5.1 mmol/L 3.9  4.0  4.0  3.5  3.9  3.9   Chloride 98 - 111 mmol/L 104  105  102  105 R  103 R  102 R   CO2 22 - 32 mmol/L 27  28  30  26  25  27    Glucose, Bld 70 - 99 mg/dL 108High   94  106High   80 R  95 R  94 R  BUN 6 - 20 mg/dL 11  12  14  12  9  12    Creatinine, Ser 0.44 - 1.00 mg/dL 0.76  0.72  0.92  0.76  0.70  0.69   Calcium 8.9 - 10.3 mg/dL 9.0  8.9  9.2  8.7Low   8.7Low   8.8Low    Total Protein 6.5 - 8.1 g/dL 8.5High   7.8  7.9  7.3  7.7  7.8   Albumin 3.5 - 5.0 g/dL 4.5  4.1  4.1  3.7  3.9  3.9   AST 15 - 41 U/L 24  19  20  17  19  19    ALT 0 - 44 U/L 32  17  19  13Low  R  15 R  16 R   Alkaline Phosphatase 38 - 126 U/L 81  75  78  61  68  61   Total Bilirubin 0.3 - 1.2 mg/dL 1.5High   0.7  0.8  0.8  0.5  0.5   GFR calc non Af Amer >60 mL/min >60  >60  >60  >60  >60  >60   GFR calc Af Amer >60 mL/min >60  >60  >60 CM  >60 CM  >60 CM  >60 CM   Anion gap 5 - 15 9  5  CM  5 CM  5 CM  5 CM  6   Comment: Performed at [facility] [address] [place], [place] [address]  Resulting Agency  [facility] CLIN LAB [place] CLIN LAB [place] CLIN LAB [place] CL[facility] LAB [place] CL[facility] LAB [facility] CLIN LAB      Specimen Collected: 08/21/18 09:39 Last Resulted: 08/21/18 10:06

## 2018-09-10 NOTE — Telephone Encounter (Signed)
No interaction with medications     Inform patient

## 2018-09-10 NOTE — Patient Instructions (Signed)
Urinary Tract Infection, Adult A urinary tract infection (UTI) is an infection of any part of the urinary tract. The urinary tract includes:  The kidneys.  The ureters.  The bladder.  The urethra. These organs make, store, and get rid of pee (urine) in the body. What are the causes? This is caused by germs (bacteria) in your genital area. These germs grow and cause swelling (inflammation) of your urinary tract. What increases the risk? You are more likely to develop this condition if:  You have a small, thin tube (catheter) to drain pee.  You cannot control when you pee or poop (incontinence).  You are female, and: ? You use these methods to prevent pregnancy: ? A medicine that kills sperm (spermicide). ? A device that blocks sperm (diaphragm). ? You have low levels of a female hormone (estrogen). ? You are pregnant.  You have genes that add to your risk.  You are sexually active.  You take antibiotic medicines.  You have trouble peeing because of: ? A prostate that is bigger than normal, if you are female. ? A blockage in the part of your body that drains pee from the bladder (urethra). ? A kidney stone. ? A nerve condition that affects your bladder (neurogenic bladder). ? Not getting enough to drink. ? Not peeing often enough.  You have other conditions, such as: ? Diabetes. ? A weak disease-fighting system (immune system). ? Sickle cell disease. ? Gout. ? Injury of the spine. What are the signs or symptoms? Symptoms of this condition include:  Needing to pee right away (urgently).  Peeing often.  Peeing small amounts often.  Pain or burning when peeing.  Blood in the pee.  Pee that smells bad or not like normal.  Trouble peeing.  Pee that is cloudy.  Fluid coming from the vagina, if you are female.  Pain in the belly or lower back. Other symptoms include:  Throwing up (vomiting).  No urge to eat.  Feeling mixed up (confused).  Being tired  and grouchy (irritable).  A fever.  Watery poop (diarrhea). How is this treated? This condition may be treated with:  Antibiotic medicine.  Other medicines.  Drinking enough water. Follow these instructions at home:  Medicines  Take over-the-counter and prescription medicines only as told by your doctor.  If you were prescribed an antibiotic medicine, take it as told by your doctor. Do not stop taking it even if you start to feel better. General instructions  Make sure you: ? Pee until your bladder is empty. ? Do not hold pee for a long time. ? Empty your bladder after sex. ? Wipe from front to back after pooping if you are a female. Use each tissue one time when you wipe.  Drink enough fluid to keep your pee pale yellow.  Keep all follow-up visits as told by your doctor. This is important. Contact a doctor if:  You do not get better after 1-2 days.  Your symptoms go away and then come back. Get help right away if:  You have very bad back pain.  You have very bad pain in your lower belly.  You have a fever.  You are sick to your stomach (nauseous).  You are throwing up. Summary  A urinary tract infection (UTI) is an infection of any part of the urinary tract.  This condition is caused by germs in your genital area.  There are many risk factors for a UTI. These include having a small, thin   tube to drain pee and not being able to control when you pee or poop.  Treatment includes antibiotic medicines for germs.  Drink enough fluid to keep your pee pale yellow. This information is not intended to replace advice given to you by your health care provider. Make sure you discuss any questions you have with your health care provider. Document Released: 08/28/2007 Document Revised: 09/18/2017 Document Reviewed: 09/18/2017 Elsevier Interactive Patient Education  2019 Elsevier Inc.  

## 2018-09-10 NOTE — Telephone Encounter (Signed)
Copied from [place] [id number]. Topic: General - Other >> Sep 10, 2018  9:47 AM Carolyn Stare wrote: Pt picked up the bactrim and her paperwork said let the doctor know if you are on low  thyroid and she is asking if it will ok to take the medication    sulfamethoxazole-trimethoprim (BACTRIM DS) 800-160 MG tablet

## 2018-09-10 NOTE — Progress Notes (Signed)
Subjective:    Patient ID: Jennifer English, female    DOB: 07/05/1965, 53 y.o.   MRN: [id number]  HPI   Patient presents to clinic due to some vaginal itch, urinary discomfort and did have a small amount of yellow vaginal discharge over the weekend, but the discharge is now resolved.  Itching and some discomfort with urination continues to persist.  Has no fever or chills.  Denies any foul odor of urine.  Tries to keep up good water intake.  Patient Active Problem List   Diagnosis Date Noted  . Hives 08/04/2018  . 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 Rash 06/26/2018  . Lightheadedness 06/26/2018  . Hot flashes 12/12/2017  . Cardiac murmur 07/03/2017  . DUB (dysfunctional uterine bleeding) 07/03/2017  . Fibroid 05/28/2017  . CML (chronic myelocytic leukemia) ([place]) 02/20/2017  . Hypothyroidism 02/20/2017   Social History   Tobacco Use  . Smoking status: Never Smoker  . Smokeless tobacco: Never Used  Substance Use Topics  . Alcohol use: No    Frequency: Never   Review of Systems  Constitutional: Negative for chills, fatigue and fever.  HENT: Negative for congestion, ear pain, sinus pain and sore throat.   Eyes: Negative.   Respiratory: Negative for cough, shortness of breath and wheezing.   Cardiovascular: Negative for chest pain, palpitations and leg swelling.  Gastrointestinal: Negative for abdominal pain, diarrhea, nausea and vomiting.  Genitourinary: Positive vaginal itching and discomfort, dysuria and some vaginal discharge that has since resolved Musculoskeletal: Negative for arthralgias and myalgias.  Skin: Negative for color change, pallor and rash.  Neurological: Negative for syncope, light-headedness and headaches.  Psychiatric/Behavioral: The patient is not nervous/anxious.       Objective:   Physical Exam Vitals signs and nursing note reviewed.  Constitutional:      General: She is not in acute distress.    Appearance: She is not toxic-appearing.  HENT:   Head: Normocephalic and atraumatic.  Eyes:     General: No scleral icterus.    Extraocular Movements: Extraocular movements intact.     Conjunctiva/sclera: Conjunctivae normal.  Cardiovascular:     Rate and Rhythm: Normal rate and regular rhythm.     Heart sounds: Normal heart sounds.  Pulmonary:     Effort: Pulmonary effort is normal. No respiratory distress.     Breath sounds: Normal breath sounds.  Abdominal:     General: Bowel sounds are normal. There is no distension.     Palpations: Abdomen is soft.     Tenderness: There is no abdominal tenderness. There is no right CVA tenderness or guarding.  Skin:    General: Skin is warm and dry.     Coloration: Skin is not jaundiced or pale.  Neurological:     Mental Status: She is alert and oriented to person, place, and time.     Gait: Gait normal.  Psychiatric:        Mood and Affect: Mood normal.        Behavior: Behavior normal.    Today's Vitals   09/10/18 0837  BP: 120/64  Pulse: 84  Resp: 16  Temp: 98.8 F (37.1 C)  TempSrc: Oral  SpO2: 99%  Weight: 179 lb 12.8 oz (81.6 kg)  Height: 5\' 4"  (1.626 m)   Body mass index is 30.86 kg/m.     Assessment & Plan:    Urinary tract infection- patient's urine is positive for both leukocytes and nitrites.  This would explain her urinary discomfort and  vaginal itching symptoms.  We will treat as UTI with Bactrim twice daily for 5 days.  She has been advised to increase fluid intake, avoid excess sugary or caffeinated beverages wear cotton underwear and always practice good handwashing.  Advised that it is always a good idea to eat a daily yogurt while taking an antibiotic to help replace good-gut bacteria.  Advised that if she begins to have worsening vaginal itching in her vaginal discharge, to let us know at that time can send in a Diflucan.  I would like to hold off on setting Diflucan at this time and treat the UTI to see if this resolves symptoms.  Patient will otherwise keep  regularly scheduled follow-up with primary care and oncology as planned.  She will return to clinic sooner if any issues arise.

## 2018-09-10 NOTE — Telephone Encounter (Signed)
Left message for patient to return call back. PEC may give information.  

## 2018-09-12 LAB — URINE CULTURE
MICRO NUMBER:: 583785
SPECIMEN QUALITY:: ADEQUATE

## 2018-09-14 ENCOUNTER — Telehealth: Payer: Self-pay

## 2018-09-14 ENCOUNTER — Other Ambulatory Visit: Payer: Self-pay | Admitting: Family Medicine

## 2018-09-14 MED ORDER — NITROFURANTOIN MONOHYD MACRO 100 MG PO CAPS: 100.0000 mg | ORAL_CAPSULE | Freq: Two times a day (BID) | ORAL | 0 refills | Status: DC

## 2018-09-14 NOTE — Telephone Encounter (Signed)
Copied from [place] [id number]. Topic: General - Other >> Sep 14, 2018  2:12 PM Celene Kras A wrote: Reason for CRM: Pt called regarding message she received on mychart. Pt is concerned and wants to know what was found in her urine culture. Pt is very upset because she is just now finding out that her body was rejecting the antibiotic because she is on the last dose. Pt states she is on chemo and does not feel safe taking lots of antibiotics. Please advise.

## 2018-09-15 NOTE — Telephone Encounter (Signed)
Pt called back to follow up from yesterday message. Thank you! Pt goes to work at 11 am  Call pt @ [phone].

## 2018-09-15 NOTE — Telephone Encounter (Signed)
E.coli was found in urine, this is a common bacteria that causes UTI  Her body did not reject antibiotic, the bacteria that grew in the culture just happens to be resistant to the Bactrim and needs to be changed to something that will kill the bacteria  A different antibiotic needs to be taken to treat the UTI so it does not become worse  Sometimes bacteria that causes infection in our body is resistant to certain bacteria and we do not know this until the culture results come back.   The macrobid sent in yesterday by Dr Caryl Bis (he was covering while I was off) will work to kill the bacteria; this is shown in the culture report.

## 2018-09-15 NOTE — Telephone Encounter (Signed)
Called Pt with results and she stated she understood. Pt also stated she picked up the Rx that Dr. Caryl Bis sent in and she has already started taking it.

## 2018-09-15 NOTE — Telephone Encounter (Signed)
Perfect thanks

## 2018-09-15 NOTE — Telephone Encounter (Signed)
Did you see this patient  [place]

## 2018-09-16 ENCOUNTER — Other Ambulatory Visit: Payer: Self-pay | Admitting: Internal Medicine

## 2018-09-16 ENCOUNTER — Encounter: Payer: Self-pay | Admitting: Internal Medicine

## 2018-09-16 DIAGNOSIS — N3000 Acute cystitis without hematuria: Secondary | ICD-10-CM

## 2018-09-16 DIAGNOSIS — N760 Acute vaginitis: Secondary | ICD-10-CM

## 2018-09-18 ENCOUNTER — Other Ambulatory Visit: Payer: Self-pay | Admitting: Internal Medicine

## 2018-09-18 ENCOUNTER — Telehealth: Payer: Self-pay | Admitting: *Deleted

## 2018-09-18 ENCOUNTER — Other Ambulatory Visit: Payer: 59

## 2018-09-18 DIAGNOSIS — Z124 Encounter for screening for malignant neoplasm of cervix: Secondary | ICD-10-CM

## 2018-09-18 DIAGNOSIS — Z20822 Contact with and (suspected) exposure to covid-19: Secondary | ICD-10-CM

## 2018-09-18 DIAGNOSIS — Z1231 Encounter for screening mammogram for malignant neoplasm of breast: Secondary | ICD-10-CM

## 2018-09-18 DIAGNOSIS — D219 Benign neoplasm of connective and other soft tissue, unspecified: Secondary | ICD-10-CM

## 2018-09-18 DIAGNOSIS — R232 Flushing: Secondary | ICD-10-CM

## 2018-09-18 NOTE — Telephone Encounter (Signed)
Pt. Calling back. Scheduled for testing today.

## 2018-09-18 NOTE — Addendum Note (Signed)
Addended by: Orland Mustard on: 09/18/2018 10:16 AM   Modules accepted: Orders

## 2018-09-18 NOTE — Telephone Encounter (Signed)
-----   Message from Delorise Jackson, MD sent at 09/18/2018  8:19 AM EDT ----- Primary Coverage 03/14/1966 COVID exposure   Payer Plan Sponsor Code Group Number Group Name [id number]  [id number]  Primary Subscriber  ID Name [facility] Address [id number] [place], [place] [id number]    [place] [address], [place] [address] Secondary Coverage  Payer Plan Sponsor Code Group Number Group Name [place] OTHER  [id number]  Secondary Subscriber  ID Name [facility] Address [id number] [place], T[place] [id number]     [place], [place] [address]

## 2018-09-18 NOTE — Telephone Encounter (Signed)
Left voicemail for patient to call back to scheduled COVID test.  Order entered.

## 2018-09-24 LAB — NOVEL CORONAVIRUS, NAA: SARS-CoV-2, NAA: NOT DETECTED

## 2018-11-13 ENCOUNTER — Other Ambulatory Visit: Payer: Self-pay | Admitting: Internal Medicine

## 2018-11-13 ENCOUNTER — Telehealth: Payer: Self-pay | Admitting: Internal Medicine

## 2018-11-13 DIAGNOSIS — E039 Hypothyroidism, unspecified: Secondary | ICD-10-CM

## 2018-11-13 MED ORDER — LEVOTHYROXINE SODIUM 25 MCG PO TABS: 25.0000 ug | ORAL_TABLET | Freq: Every day | ORAL | 0 refills | Status: DC

## 2018-11-13 NOTE — Telephone Encounter (Signed)
Call pt she needs to schedule lab visit for TSH check on thyroid medication asap  [place]

## 2018-11-18 ENCOUNTER — Encounter: Payer: Self-pay | Admitting: Oncology

## 2018-11-18 ENCOUNTER — Inpatient Hospital Stay (HOSPITAL_BASED_OUTPATIENT_CLINIC_OR_DEPARTMENT_OTHER): Payer: 59 | Admitting: Oncology

## 2018-11-18 ENCOUNTER — Inpatient Hospital Stay: Payer: 59 | Attending: Oncology

## 2018-11-18 ENCOUNTER — Other Ambulatory Visit: Payer: Self-pay

## 2018-11-18 VITALS — BP 132/83 | HR 80 | Temp 97.9°F | Wt 177.5 lb

## 2018-11-18 DIAGNOSIS — C9210 Chronic myeloid leukemia, BCR/ABL-positive, not having achieved remission: Secondary | ICD-10-CM

## 2018-11-18 DIAGNOSIS — E038 Other specified hypothyroidism: Secondary | ICD-10-CM | POA: Diagnosis not present

## 2018-11-18 DIAGNOSIS — C9211 Chronic myeloid leukemia, BCR/ABL-positive, in remission: Secondary | ICD-10-CM | POA: Diagnosis not present

## 2018-11-18 DIAGNOSIS — E039 Hypothyroidism, unspecified: Secondary | ICD-10-CM | POA: Diagnosis not present

## 2018-11-18 LAB — CBC WITH DIFFERENTIAL/PLATELET
Abs Immature Granulocytes: 0.01 10*3/uL (ref 0.00–0.07)
Basophils Absolute: 0 10*3/uL (ref 0.0–0.1)
Basophils Relative: 1 %
Eosinophils Absolute: 0.2 10*3/uL (ref 0.0–0.5)
Eosinophils Relative: 5 %
HCT: 38.1 % (ref 36.0–46.0)
Hemoglobin: 12.6 g/dL (ref 12.0–15.0)
Immature Granulocytes: 0 %
Lymphocytes Relative: 35 %
Lymphs Abs: 1.4 10*3/uL (ref 0.7–4.0)
MCH: 31 pg (ref 26.0–34.0)
MCHC: 33.1 g/dL (ref 30.0–36.0)
MCV: 93.8 fL (ref 80.0–100.0)
Monocytes Absolute: 0.3 10*3/uL (ref 0.1–1.0)
Monocytes Relative: 7 %
Neutro Abs: 2.1 10*3/uL (ref 1.7–7.7)
Neutrophils Relative %: 52 %
Platelets: 226 10*3/uL (ref 150–400)
RBC: 4.06 MIL/uL (ref 3.87–5.11)
RDW: 11.9 % (ref 11.5–15.5)
WBC: 4 10*3/uL (ref 4.0–10.5)
nRBC: 0 % (ref 0.0–0.2)

## 2018-11-18 LAB — COMPREHENSIVE METABOLIC PANEL
ALT: 22 U/L (ref 0–44)
AST: 21 U/L (ref 15–41)
Albumin: 4.1 g/dL (ref 3.5–5.0)
Alkaline Phosphatase: 57 U/L (ref 38–126)
Anion gap: 9 (ref 5–15)
BUN: 10 mg/dL (ref 6–20)
CO2: 25 mmol/L (ref 22–32)
Calcium: 9.1 mg/dL (ref 8.9–10.3)
Chloride: 105 mmol/L (ref 98–111)
Creatinine, Ser: 0.68 mg/dL (ref 0.44–1.00)
GFR calc Af Amer: >60 mL/min (ref >60)
GFR calc non Af Amer: >60 mL/min (ref >60)
Glucose, Bld: 123 mg/dL — ABNORMAL HIGH (ref 70–99)
Potassium: 4 mmol/L (ref 3.5–5.1)
Sodium: 139 mmol/L (ref 135–145)
Total Bilirubin: 1.1 mg/dL (ref 0.3–1.2)
Total Protein: 7.8 g/dL (ref 6.5–8.1)

## 2018-11-18 NOTE — Progress Notes (Signed)
Hematology/Oncology follow up note [facility] Telephone:([phone]) [phone] [phone]   Patient Care Team: McLean-Scocuzza, Nino Glow, MD as PCP - General (Internal Medicine)  REFERRING PROVIDER: Previous oncologist from [place] Dr.Hasan Rizvi CHIEF COMPLAINTS/PURPOSE OF CONSULTATION:  Management of CML  HISTORY OF PRESENTING ILLNESS:  Jennifer English is a  53 y.o.  female with PMH listed below who was referred to by her oncologist in [place]. [place] to [place] this summer. She has not had local pcp yet. Extensive medical records review was performed.  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 She had dyuria symptoms at that time and lab work up found extreme leukocytosis with WBC 173,500 and she was initially started on hydrea. After CML was confirmed, she was started on Tasigna 300mg  BID since then and has achieved remission. . Also had splenomegaly at presentation about 5-6 cm below LCM, resolved. . She reports being complaint on nilotinib however, she has had period of time when her medication runs out. Although she moved this summer, due to lot of stresses associated with relocation and new job, she waited until now see hematologist.  Denies any fever, chill, weight loss, bleeding events. She used to have EKG done every 3 months and faxed to oncologist. She has not had any EKG done after July this year. No PCP Also reports recently feeling urine has odor and also very mild burning sensation when passing urine. She requests to have urine tested.  Has hypothyroidism, not taking thyroid supplements.  History of iron deficiency due to heavy menstrual periods/uterine fibroids, taking iron supplements.  # 10/09/2012, bone marrow aspirate smears and clots: Markedly hypercellular marrow with prominent myeloid hyperplasia and increasing small hypolobated megakaryocytes. The overall findings are consistent with chronic myeloid leukemia, chronic phase.  # 03/22/2015 BCR ABL quantification PCR showed e13a2 (b2a2 p210) transcript <0.001%,e14a2 ( b3a2, p210)  transcript 0.023%, e1a2 (p190) transcript <0.001% # 08/17/2015 BCR ABL quantification PCR showed e13a2 (b2a2 p210) transcript 0.011%,e14a2 ( b3a2, p210)  transcript <0.001%, e1a2 (p190) transcript <0.001%  # 04/25/2016 BCR ABL quantification PCR showed e13a2 (b2a2 p210) transcript <0.001%,e14a2 ( b3a2, p210)  transcript <0.001%, e1a2 (p190) transcript <0.001%  #09/17/2016  BCR ABL quantification PCR showed e13a2 (b2a2 p210) transcript <0.0032%,e14a2 ( b3a2, p210)  transcript <0.0032%, e1a2 (p190) transcript <0.0032%   02/19/2017 BCR ABL quantification PCR showed b2a2 transcript <0.0032%, b3a2 transcript <0.0032%, E1A2 transcript <0.0032%  INTERVAL HISTORY Jennifer English is a 53 y.o. female who has above history reviewed by me today presents for follow up of CML.  Patient reports that she has been compliant with to [facility]. She works as a Freight forwarder at [employer]. Denies any new concerns or complaints today. Hypothyroidism following up with primary care physician.  On levothyroxine 25 MCG daily.  Denies any fatigue. Denies any fever, chills, cough, diarrhea, chest pain, abdominal pain    Review of Systems  Constitutional: Negative for appetite change, chills, fatigue and fever.  HENT:   Negative for hearing loss and voice change.   Eyes: Negative for eye problems.  Respiratory: Negative for chest tightness and cough.   Cardiovascular: Negative for chest pain.  Gastrointestinal: Negative for abdominal distention, abdominal pain and blood in stool.  Endocrine: Negative for hot flashes.  Genitourinary: Negative for difficulty urinating and frequency.   Musculoskeletal: Negative for arthralgias.  Skin: Negative for itching and rash.  Neurological: Negative for extremity weakness.  Hematological: Negative for adenopathy.  Psychiatric/Behavioral: Negative for confusion.  The patient is not  nervous/anxious.    MEDICAL HISTORY:  Past Medical History:  Diagnosis Date  . Allergy   . CML (chronic myelocytic leukemia) ([place])   . Fibroids   . Heart murmur   . Thyroid disease    h/o thyroid d/o not on meds   . Uterine fibroid   . UTI (urinary tract infection)    x2   . 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 CESAREAN SECTION     x 1 2003   . CYSTECTOMY     middle finger right hand   . OTHER SURGICAL HISTORY     cyst removed from finger  . TONSILECTOMY, ADENOIDECTOMY, BILATERAL MYRINGOTOMY AND TUBES    . TYMPANOSTOMY TUBE PLACEMENT     1978    SOCIAL HISTORY: Social History   Socioeconomic History  . Marital status: Married    Spouse name: Not on file  . Number of children: Not on file  . Years of education: Not on file  . Highest education level: Not on file  Occupational History  . Not on file  Social Needs  . Financial resource strain: Not on file  . Food insecurity    Worry: Not on file    Inability: Not on file  . Transportation needs    Medical: Not on file    Non-medical: Not on file  Tobacco Use  . Smoking status: Never Smoker  . Smokeless tobacco: Never Used  Substance and Sexual Activity  . Alcohol use: No    Frequency: Never  . Drug use: No  . Sexual activity: Yes    Birth control/protection: None  Lifestyle  . Physical activity    Days per week: Not on file    Minutes per session: Not on file  . Stress: Not on file  Relationships  . Social Herbalist on phone: Not on file    Gets together: Not on file    Attends religious service: Not on file    Active member of club or organization: Not on file    Attends meetings of clubs or organizations: Not on file    Relationship status: Not on file  . Intimate partner violence    Fear of current or ex partner: Not on file    Emotionally abused: Not on file    Physically abused: Not on file     Forced sexual activity: Not on file  Other Topics Concern  . 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 Heart disease Father        CAD with stents   . Hyperlipidemia Father   . Hypertension Father   . Dementia Maternal Grandfather   . Melanoma Paternal Grandmother   . Bladder Cancer Paternal Grandmother   . Cancer Paternal Grandmother        lung/colon ?primary   . Arthritis Paternal Grandmother   . Bladder Cancer Paternal Grandfather   . Cancer Paternal Grandfather        bladder died in 3s   . Early death Paternal Grandfather   .  Alcohol abuse Maternal Grandmother   . Hepatitis Maternal Grandmother   . Early death Maternal Grandmother   . Kidney disease Maternal Grandmother   . Macular degeneration Maternal Grandmother   . Liver disease Maternal Grandmother        ?alcoholic hepatitis   . Asthma Brother   . Asthma Brother   . Macular degeneration Maternal Aunt     ALLERGIES:  is allergic to ibuprofen; levoxyl [levothyroxine sodium]; synthroid [levothyroxine]; aspirin; avocado; eggs or egg-derived products; and penicillins.  MEDICATIONS:  Current Outpatient Medications  Medication Sig Dispense Refill  . Ascorbic Acid (VITAMIN C) 100 MG tablet Take 100 mg by mouth daily.    . ferrous sulfate 325 (65 FE) MG tablet Take 325 mg by mouth daily with breakfast.    . fexofenadine (ALLEGRA) 180 MG tablet Take 180 mg by mouth daily as needed for allergies or rhinitis.    . hydrocortisone 2.5 % cream Apply topically 2 (two) times daily. Right back 30 g 0  . ketoconazole (NIZORAL) 2 % cream Use 2-3 x per day x 1-3 weeks 60 g 11  . levothyroxine (LEVOXYL) 25 MCG tablet Take 1 tablet (25 mcg total) by mouth daily before breakfast. Needs lab  check further refills 30 tablet 0  . magnesium citrate SOLN Take 1 Bottle by mouth once.    . nitrofurantoin, macrocrystal-monohydrate, (MACROBID) 100 MG capsule Take 1 capsule (100 mg total) by mouth 2 (two) times daily. 10 capsule 0  . TASIGNA 150 MG capsule TAKE 2 CAPSULES BY MOUTH EVERY 12 HOURS WITH A GLASS OF WATER ON AN EMPTY STOMACH AT LEAST 1 HR BEFORE OR 2 HR AFTER EATING. AVOID GRAPEFRUIT PRODUCTS. 112 capsule 1  . triamcinolone cream (KENALOG) 0.1 % Apply 1 application topically 2 (two) times daily. 60 g 0   No current facility-administered medications for this visit.      PHYSICAL EXAMINATION: ECOG PERFORMANCE STATUS: 0 - Asymptomatic Vitals:   11/18/18 1011  BP: 132/83  Pulse: 80  Temp: 97.9 F (36.6 C)   Filed Weights   11/18/18 1011  Weight: 177 lb 8 oz (80.5 kg)   Physical Exam  Constitutional: She is oriented to person, place, and time. No distress.  HENT:  Head: Normocephalic and atraumatic.  Mouth/Throat: No oropharyngeal exudate.  Eyes: Pupils are equal, round, and reactive to light. EOM are normal. No scleral icterus.  Neck: Normal range of motion. Neck supple.  Cardiovascular: Normal rate and regular rhythm.  No murmur heard. Pulmonary/Chest: Effort normal. No respiratory distress. She has no rales. She exhibits no tenderness.  Abdominal: Soft. She exhibits no distension. There is no abdominal tenderness.  Musculoskeletal: Normal range of motion.        General: No edema.  Neurological: She is alert and oriented to person, place, and time. She exhibits normal muscle tone.  Skin: Skin is warm and dry. She is not diaphoretic. No erythema.        LABORATORY DATA:  I have reviewed the data as listed Lab Results  Component Value Date   WBC 4.0 11/18/2018   HGB 12.6 11/18/2018   HCT 38.1 11/18/2018   MCV 93.8 11/18/2018   PLT 226 11/18/2018   Recent Labs    04/29/18 0910 08/21/18 0939 08/28/18 1113 11/18/18 0953  NA 138 140  --  139  K 4.0  3.9  --  4.0  CL 105 104  --  105  CO2 28 27  --  25  GLUCOSE 94 108*  --  123*  BUN 12 11  --  10  CREATININE 0.72 0.76  --  0.68  CALCIUM 8.9 9.0  --  9.1  GFRNONAA >60 >60  --  >60  GFRAA >60 >60  --  >60  PROT 7.8 8.5*  --  7.8  ALBUMIN 4.1 4.5  --  4.1  AST 19 24  --  21  ALT 17 32  --  22  ALKPHOS 75 81  --  57  BILITOT 0.7 1.5* 1.3* 1.1  BILIDIR  --   --  0.2  --     # 09/03/2017  BCR-ABL1 PCR quantification: NEGATIVE for the BCR-ABL1 e1a2 (p190), e13a2 (b2a2, p210) and e14a2  (b3a2, p210) fusion transcripts   ASSESSMENT & PLAN:  1. CML (chronic myelocytic leukemia) ([place])   2. Hyperbilirubinemia   3. Other specified hypothyroidism   #CML, EKG was performed and was independently reviewed by me. Normal QTC interval. Labs are reviewed and discussed with patient. Stable counts. Continue to Cigna 300 mg twice daily. Today's BCR ABL qualification was pending. June 2020 BCR ABL quantification showed patient is in molecular remission.  #Previously elevated hyperbilirubinemia has resolved. #Hypothyroidism, patient follow-up with PCP.  Patient is currently on levothyroxine 25 MCG daily.  The patient knows to call the clinic with any problems questions or concerns. We spent sufficient time to discuss many aspect of care, questions were answered to patient's satisfaction.  Return of visit: 3 months.  Check CBC, CMP, BCR ABL quantification.  [name], MD, PhD Hematology Oncology [facility] at [facility] Pager- [phone] 11/18/2018

## 2018-11-18 NOTE — Progress Notes (Signed)
Pt here for follow up. No acute concerns noted.

## 2018-11-19 NOTE — Telephone Encounter (Signed)
Left detailed message for pt to call back and schedule.

## 2018-11-20 ENCOUNTER — Telehealth: Payer: Self-pay

## 2018-11-20 NOTE — Telephone Encounter (Signed)
Copied from [place] [id number]. Topic: Quick Sport and exercise psychologist Patient (Clinic Use ONLY) >> Nov 19, 2018  4:53 PM Pauline Good wrote: Reason for CRM: pt returning call to office

## 2018-11-23 ENCOUNTER — Encounter: Payer: Self-pay | Admitting: Internal Medicine

## 2018-11-23 ENCOUNTER — Other Ambulatory Visit: Payer: Self-pay | Admitting: Internal Medicine

## 2018-11-23 DIAGNOSIS — E039 Hypothyroidism, unspecified: Secondary | ICD-10-CM

## 2018-11-23 LAB — BCR-ABL1, CML/ALL, PCR, QUANT

## 2018-11-24 ENCOUNTER — Other Ambulatory Visit: Payer: Self-pay | Admitting: Internal Medicine

## 2018-11-24 DIAGNOSIS — E039 Hypothyroidism, unspecified: Secondary | ICD-10-CM

## 2018-11-24 MED ORDER — LEVOTHYROXINE SODIUM 25 MCG PO TABS: 25.0000 ug | ORAL_TABLET | Freq: Every day | ORAL | 0 refills | Status: DC

## 2018-11-24 NOTE — Telephone Encounter (Signed)
"  MD notes: Levoxyl only" must be removed, please advise. Truman Hayward returned call

## 2018-11-24 NOTE — Addendum Note (Signed)
Addended by: Orland Mustard on: 11/24/2018 05:31 PM   Modules accepted: Orders

## 2018-11-24 NOTE — Telephone Encounter (Signed)
Lee from [facility] called to report that new prescription note is needed before pt can receive Rx. Note must remove the statement "brand name only"  Please advise

## 2018-11-25 ENCOUNTER — Other Ambulatory Visit: Payer: Self-pay | Admitting: Internal Medicine

## 2018-11-25 DIAGNOSIS — E039 Hypothyroidism, unspecified: Secondary | ICD-10-CM

## 2018-11-25 MED ORDER — LEVOTHYROXINE SODIUM 25 MCG PO TABS: 25.0000 ug | ORAL_TABLET | Freq: Every day | ORAL | 0 refills | Status: DC

## 2018-11-25 NOTE — Telephone Encounter (Signed)
Called to confirm w/ pharmacy that MD note has been removed.  Pharmacy is able to fill the generic brand.  Pharmacy tech said that patient picked up the Levoxyl on 11/22/18 so patient cannot return that prescription since it has already been taken out of the store.  Called to inform patient.  No answer.  Left detailed voice message.  (ok per DPR).

## 2018-11-25 NOTE — Telephone Encounter (Signed)
I did remove it and resent this am call pharmacy to check on this   [place]

## 2018-11-26 NOTE — Telephone Encounter (Signed)
LMTCB

## 2018-12-02 ENCOUNTER — Encounter: Payer: Self-pay | Admitting: Internal Medicine

## 2018-12-10 ENCOUNTER — Telehealth: Payer: Self-pay | Admitting: Internal Medicine

## 2018-12-10 ENCOUNTER — Other Ambulatory Visit: Payer: Self-pay | Admitting: Oncology

## 2018-12-10 ENCOUNTER — Other Ambulatory Visit: Payer: Self-pay

## 2018-12-10 DIAGNOSIS — E039 Hypothyroidism, unspecified: Secondary | ICD-10-CM

## 2018-12-10 DIAGNOSIS — C9210 Chronic myeloid leukemia, BCR/ABL-positive, not having achieved remission: Secondary | ICD-10-CM

## 2018-12-10 NOTE — Telephone Encounter (Signed)
Pt was advised not to come into [facility] due to being high risk/ Pt wants to know what she should do to get her blood work done today at another facility or at the Cancer center/ please advise

## 2018-12-10 NOTE — Telephone Encounter (Signed)
I have cancelled [facility] order. I have order for [place] & she is coming here in office to have TSH drawn.

## 2018-12-10 NOTE — Telephone Encounter (Signed)
CBC with Differential/Platelet Order: [id number] Status:  Final result Visible to patient:  Yes (MyChart) Next appt:  01/07/2019 at 09:00 AM in Family Medicine Nino Glow McLean-Scocuzza, MD) Dx:  CML (chronic myelocytic leukemia) ([place])  Ref Range & Units 3wk ago 86mo ago 68mo ago  WBC 4.0 - 10.5 K/uL 4.0  4.5  3.7Low    RBC 3.87 - 5.11 MIL/uL 4.06  4.39  4.14   Hemoglobin 12.0 - 15.0 g/dL 12.6  13.8  12.7   HCT 36.0 - 46.0 % 38.1  41.2  38.1   MCV 80.0 - 100.0 fL 93.8  93.8  92.0   MCH 26.0 - 34.0 pg 31.0  31.4  30.7   MCHC 30.0 - 36.0 g/dL 33.1  33.5  33.3   RDW 11.5 - 15.5 % 11.9  12.0  12.6   Platelets 150 - 400 K/uL 226  225  203   nRBC 0.0 - 0.2 % 0.0  0.0  0.0   Neutrophils Relative % % 52  59  46   Neutro Abs 1.7 - 7.7 K/uL 2.1  2.7  1.7   Lymphocytes Relative % 35  31  39   Lymphs Abs 0.7 - 4.0 K/uL 1.4  1.4  1.5   Monocytes Relative % 7  5  8    Monocytes Absolute 0.1 - 1.0 K/uL 0.3  0.2  0.3   Eosinophils Relative % 5  4  6    Eosinophils Absolute 0.0 - 0.5 K/uL 0.2  0.2  0.2   Basophils Relative % 1  1  1    Basophils Absolute 0.0 - 0.1 K/uL 0.0  0.0  0.0   Immature Granulocytes % 0  0  0   Abs Immature Granulocytes 0.00 - 0.07 K/uL 0.01  0.01 CM  0.01 CM   Comment: Performed at [facility], [address] [place], [place] [address]  Resulting Agency  [facility] CLIN LAB [facility] CLIN LAB [facility] LAB      Specimen Collected: 11/18/18 09:53 Last Resulted: 11/18/18 10:05     Lab Flowsheet   Order Details   View Encounter   Lab and Collection Details   Routing   Result History     CM=Additional comments      Other Results from 11/18/2018  Contains abnormal data Comprehensive metabolic panel Order: [id number]  Status:  Final result Visible to patient:  Yes (MyChart) Next appt:  01/07/2019 at 09:00 AM in Family Medicine Nino Glow McLean-Scocuzza, MD) Dx:  [facility] (chronic myelocytic leukemia) ([place])  Ref Range & Units 3wk ago (11/18/18) 73mo ago (08/28/18) 29mo  ago (08/21/18)  Sodium 135 - 145 mmol/L 139   140   Potassium 3.5 - 5.1 mmol/L 4.0   3.9   Chloride 98 - 111 mmol/L 105   104   CO2 22 - 32 mmol/L 25   27   Glucose, Bld 70 - 99 mg/dL 123High    108High    BUN 6 - 20 mg/dL 10   11   Creatinine, Ser 0.44 - 1.00 mg/dL 0.68   0.76   Calcium 8.9 - 10.3 mg/dL 9.1   9.0   Total Protein 6.5 - 8.1 g/dL 7.8   8.5High    Albumin 3.5 - 5.0 g/dL 4.1   4.5   AST 15 - 41 U/L 21   24   ALT 0 - 44 U/L 22   32   Alkaline Phosphatase 38 - 126 U/L 57   81   Total  Bilirubin 0.3 - 1.2 mg/dL 1.1  1.3High  CM  1.5High    GFR calc non Af Amer >60 mL/min >60   >60   GFR calc Af Amer >60 mL/min >60   >60   Anion gap 5 - 15 9   9  CM   Comment: Performed at [facility], [address] [place], [place] [address]  Resulting Agency  [facility] CLIN LAB [facility] CLIN LAB [place] [facility]      Specimen Collected: 11/18/18 09:53 Last Resulted: 11/18/18 10:16

## 2018-12-16 ENCOUNTER — Other Ambulatory Visit: Payer: Self-pay | Admitting: Internal Medicine

## 2018-12-16 ENCOUNTER — Other Ambulatory Visit: Payer: Self-pay

## 2018-12-16 ENCOUNTER — Other Ambulatory Visit (INDEPENDENT_AMBULATORY_CARE_PROVIDER_SITE_OTHER): Payer: 59

## 2018-12-16 DIAGNOSIS — E039 Hypothyroidism, unspecified: Secondary | ICD-10-CM

## 2018-12-16 LAB — TSH: TSH: 7.93 u[IU]/mL — ABNORMAL HIGH (ref 0.35–4.50)

## 2018-12-16 MED ORDER — LEVOTHYROXINE SODIUM 50 MCG PO TABS: 50.0000 ug | ORAL_TABLET | Freq: Every day | ORAL | 3 refills | Status: DC

## 2018-12-18 ENCOUNTER — Other Ambulatory Visit: Payer: Self-pay | Admitting: Internal Medicine

## 2018-12-18 DIAGNOSIS — E039 Hypothyroidism, unspecified: Secondary | ICD-10-CM

## 2018-12-18 MED ORDER — LEVOTHYROXINE SODIUM 50 MCG PO TABS: 50.0000 ug | ORAL_TABLET | Freq: Every day | ORAL | 3 refills | Status: DC

## 2019-01-07 ENCOUNTER — Telehealth: Payer: Self-pay

## 2019-01-07 ENCOUNTER — Ambulatory Visit: Payer: 59 | Admitting: Internal Medicine

## 2019-01-07 DIAGNOSIS — Z0289 Encounter for other administrative examinations: Secondary | ICD-10-CM

## 2019-01-07 NOTE — Telephone Encounter (Signed)
Copied from [place] [id number]. Topic: Quick Communication - Appointment Cancellation >> Jan 07, 2019  8:10 AM Robina Ade, Helene Kelp D wrote: Patient called to cancel appointment scheduled for 01/07/19 due to work issue. Patient has not rescheduled their appointment. But will like to reschedule Route to department's PEC pool.

## 2019-01-07 NOTE — Telephone Encounter (Signed)
I called pt and left a vm to call ofc to schedule.

## 2019-01-18 ENCOUNTER — Encounter: Payer: Self-pay | Admitting: Internal Medicine

## 2019-02-05 ENCOUNTER — Telehealth: Payer: Self-pay | Admitting: Internal Medicine

## 2019-02-05 ENCOUNTER — Ambulatory Visit: Payer: Self-pay

## 2019-02-05 ENCOUNTER — Telehealth: Payer: Self-pay

## 2019-02-05 NOTE — Telephone Encounter (Signed)
appointment has been scheduled

## 2019-02-05 NOTE — Telephone Encounter (Signed)
Patient is calling [facility]  CB- [phone]

## 2019-02-05 NOTE — Telephone Encounter (Signed)
Incoming call from Patient with complaint of having heart Palpitations.  Patient states it comes and goes Patent notes that  It stared about a month ago.  At that time her thyroid medication was increased also.Patient states that when she misses thyroid medication, she does not have the palpitations.  Heart rate 68  B/P 113/79q heart rate. Heart rate usually in 60"s to 70's.  Pt.  States that she is a little scare right.  Has a pending appointment for Dec.  10th.   Wants to know if she should come in earlier.  Denies chest pain. States she is afraid to take increased dose.  Patient would like speak with Provider may be reached at  [phone].  Patient awaits return phone call.           Reason for Disposition . History of hyperthyroidism or taking thyroid medication  Answer Assessment - Initial Assessment Questions 1. DESCRIPTION: "Please describe your heart rate or heart beat that you are having" (e.g., fast/slow, regular/irregular, skipped or extra beats, "palpitations")      Increased thyroid medication.  A month ago 2. ONSET: "When did it start?" (Minutes, hours or days)      A month 3. DURATION: "How long does it last" (e.g., seconds, minutes, hours)      Depends feels like gast time goes away 4. PATTERN "Does it come and go, or has it been constant since it started?"  "Does it get worse with exertion?"   "Are you feeling it now?"     Come and goes 5. TAP: "Using your hand, can you tap out what you are feeling on a chair or table in front of you, so that I can hear?" (Note: not all patients can do this)       *No Answer* 6. HEART RATE: "Can you tell me your heart rate?" "How many beats in 15 seconds?"  (Note: not all patients can do this)      113/79  78  Usually betwenn 60 and 70s.   7. RECURRENT SYMPTOM: "Have you ever had this before?" If so, ask: "When was the last time?" and "What happened that time?"      yes 8. CAUSE: "What do you think is causing the palpitations?"  Maybe thyroid medications .  Scare to take or not take thyroid or not take 9. CARDIAC HISTORY: "Do you have any history of heart disease?" (e.g., heart attack, angina, bypass surgery, angioplasty, arrhythmia)      denies 10. OTHER SYMPTOMS: "Do you have any other symptoms?" (e.g., dizziness, chest pain, sweating, difficulty breathing)       Just little scare right now 11. PREGNANCY: "Is there any chance you are pregnant?" "When was your last menstrual period?"       na  Protocols used: [place]

## 2019-02-05 NOTE — Telephone Encounter (Signed)
[place] please call and schedule patient.

## 2019-02-05 NOTE — Telephone Encounter (Signed)
Copied from [place] [id number]. Topic: Quick Communication - See Telephone Encounter >> Feb 05, 2019 12:56 PM Blase Mess A wrote: CRM for notification. See Telephone encounter for: 02/05/19.Patient is calling Tye [place] back Please advise

## 2019-02-05 NOTE — Telephone Encounter (Signed)
Does she want to see endocrine for her thyroid issues and different med options other than what she is taking?  Is it anxiety causing racing heart?   Her thyroid issues are uncontrolled on the lower dose  I rec she see endocrine for their recommendations What would she like to do ? [place]

## 2019-02-05 NOTE — Telephone Encounter (Signed)
Left message for patient to return call to office. 

## 2019-02-09 ENCOUNTER — Telehealth: Payer: Self-pay | Admitting: Internal Medicine

## 2019-02-09 NOTE — Telephone Encounter (Signed)
I called pt twice and left a vm to call ofc. °

## 2019-02-10 ENCOUNTER — Ambulatory Visit (INDEPENDENT_AMBULATORY_CARE_PROVIDER_SITE_OTHER): Payer: 59 | Admitting: Internal Medicine

## 2019-02-10 ENCOUNTER — Other Ambulatory Visit: Payer: Self-pay

## 2019-02-10 ENCOUNTER — Encounter: Payer: Self-pay | Admitting: Internal Medicine

## 2019-02-10 VITALS — Ht 64.0 in | Wt 169.0 lb

## 2019-02-10 DIAGNOSIS — W19XXXD Unspecified fall, subsequent encounter: Secondary | ICD-10-CM

## 2019-02-10 DIAGNOSIS — E785 Hyperlipidemia, unspecified: Secondary | ICD-10-CM

## 2019-02-10 DIAGNOSIS — F419 Anxiety disorder, unspecified: Secondary | ICD-10-CM

## 2019-02-10 DIAGNOSIS — E559 Vitamin D deficiency, unspecified: Secondary | ICD-10-CM

## 2019-02-10 DIAGNOSIS — E039 Hypothyroidism, unspecified: Secondary | ICD-10-CM

## 2019-02-10 MED ORDER — LEVOTHYROXINE SODIUM 75 MCG PO TABS: 37.5000 ug | ORAL_TABLET | Freq: Every day | ORAL | 3 refills | Status: DC

## 2019-02-10 NOTE — Progress Notes (Signed)
Virtual Visit via Video Note  I connected with Jennifer English   on 02/10/19 at 11:00 AM EST by a video enabled telemedicine application and verified that I am speaking with the correct person using two identifiers.  Location patient: home Location provider:work or home office Persons participating in the virtual visit: patient, provider  I discussed the limitations of evaluation and management by telemedicine and the availability of in person appointments. The patient expressed understanding and agreed to proceed.   HPI: 1. Anxiety due to concern with covid exposure and covid was exposed 12/2018 while working at [employer] in [employer] and multiple coworkers tested +  2. Hypothyroidism since 20s levo 50 mcg caused increased anxiety but cut pill in 1/2 and less of sx's she is monitoring her BP and HR and BP 107-130/70s-80s and HR in upper 70s during episodes  Yesterday 02/09/2019 BP was 116/80  Results for Jennifer, English (MRN [id number]) as of 02/10/2019 12:50  Ref. Range 02/19/2017 11:23 04/03/2017 14:04 04/17/2017 08:06 08/21/2018 09:39 12/16/2018 08:20  TSH Latest Ref Range: 0.35 - 4.50 uIU/mL 4.665 (H) 5.43 (H)  11.783 (H) 7.93 (H)  Triiodothyronine,Free,Serum Latest Ref Range: 2.0 - 4.4 pg/mL  3.0  3.0   T4,Free(Direct) Latest Ref Range: 0.82 - 1.77 ng/dL  0.78  0.79 (L)   Thyroperoxidase Ab SerPl-aCnc Latest Ref Range: 0 - 34 IU/mL   >900 (H) 371 (H)    3. Fall months ago onto buttocks due to slipped on wet mud and did not hurt herself   ROS: See pertinent positives and negatives per HPI.  Past Medical History:  Diagnosis Date  . Allergy   . CML (chronic myelocytic leukemia) ([place])   . Fibroids   . Heart murmur   . Thyroid disease    h/o thyroid d/o not on meds   . Uterine fibroid   . UTI (urinary tract infection)    x2   . 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 CESAREAN SECTION     x 1 2003   . CYSTECTOMY      middle finger right hand   . OTHER SURGICAL HISTORY     cyst removed from finger  . TONSILECTOMY, ADENOIDECTOMY, BILATERAL MYRINGOTOMY AND TUBES    . TYMPANOSTOMY TUBE PLACEMENT     1978    Family History  Problem Relation Age of Onset  . Hyperthyroidism Mother   . Heart disease Father        CAD with stents   . Hyperlipidemia Father   . Hypertension Father   . Dementia Maternal Grandfather   . Melanoma Paternal Grandmother   . Bladder Cancer Paternal Grandmother   . Cancer Paternal Grandmother        lung/colon ?primary   . Arthritis Paternal Grandmother   . Bladder Cancer Paternal Grandfather   . Cancer Paternal Grandfather        bladder died in 79s   . Early death Paternal Grandfather   . Alcohol abuse Maternal Grandmother   . Hepatitis Maternal Grandmother   . Early death Maternal Grandmother   . Kidney disease Maternal Grandmother   . Macular degeneration Maternal Grandmother   . Liver disease Maternal Grandmother        ?alcoholic hepatitis   . Hyperthyroidism Sister   . Asthma Brother   . Asthma Brother   . 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  Ascorbic Acid (VITAMIN C) 100 MG tablet, Take 100 mg by mouth daily., Disp: , Rfl:  .  ferrous sulfate 325 (65 FE) MG tablet, Take 325 mg by mouth daily with breakfast., Disp: , Rfl:  .  fexofenadine (ALLEGRA) 180 MG tablet, Take 180 mg by mouth daily as needed for allergies or rhinitis., Disp: , Rfl:  .  hydrocortisone 2.5 % cream, Apply topically 2 (two) times daily. Right back, Disp: 30 g, Rfl: 0 .  ketoconazole (NIZORAL) 2 % cream, Use 2-3 x per day x 1-3 weeks, Disp: 60 g, Rfl: 11 .  levothyroxine (SYNTHROID) 75 MCG tablet, Take 0.5  tablets (37.5 mcg total) by mouth daily before breakfast. Needs lab check further refills. DC 25 and 50 mcg dose, Disp: 45 tablet, Rfl: 3 .  magnesium citrate SOLN, Take 1 Bottle by mouth once., Disp: , Rfl:  .  nitrofurantoin, macrocrystal-monohydrate, (MACROBID) 100 MG capsule, Take 1 capsule (100 mg total) by mouth 2 (two) times daily., Disp: 10 capsule, Rfl: 0 .  TASIGNA 150 MG capsule, TAKE 2 CAPSULES BY MOUTH EVERY 12 HOURS WITH A GLASS OF WATER ON AN EMPTY STOMACH AT LEAST 1 HR BEFORE OR 2 HR AFTER EATING. AVOID GRAPEFRUIT PRODUCTS., Disp: 112 capsule, Rfl: 1 .  triamcinolone cream (KENALOG) 0.1 %, Apply 1 application topically 2 (two) times daily., Disp: 60 g, Rfl: 0  EXAM:  VITALS per patient if applicable:  GENERAL: alert, oriented, appears well and in no acute distress  HEENT: atraumatic, conjunttiva clear, no obvious abnormalities on inspection of external nose and ears  NECK: normal movements of the head and neck  LUNGS: on inspection no signs of respiratory distress, breathing rate appears normal, no obvious gross SOB, gasping or wheezing  CV: no obvious cyanosis  MS: moves all visible extremities without noticeable abnormality  PSYCH/NEURO: pleasant and cooperative, no obvious depression or anxiety, speech and thought processing grossly intact  ASSESSMENT AND PLAN:  Discussed the following assessment and plan:  Anxiety declines meds for anxiety c/w DDI with Tasigna  Hypothyroidism, unspecified type - Plan: levothyroxine (SYNTHROID) 75 MCG tablet 1/2 dose  Check TSH in 6-8 weeks and if cant tolerate 37.5 dose refer to endocrine or consider armour thyroid   Fall, subsequent encounter no injury on mud   HM Declines vaccines  Will need to do pap in future and consider US/TVUS with ob/gyn for DUB/fibroids  -referred Dr. Leonides Schanz pt needs to call back and resch has not had appt as of 02/10/19   Mammogram-discussed and stressed impt of doing again today given # to call and  schedule   Colonoscopy rec do once COVID resolved   Skin consider derm in future   -we discussed possible serious and likely etiologies, options for evaluation and workup, limitations of telemedicine visit vs in person visit, treatment, treatment risks and precautions. Pt prefers to treat via telemedicine empirically rather then risking or undertaking an in person visit at this moment. Patient agrees to seek prompt in person care if worsening, new symptoms arise, or if is not improving with treatment.   I discussed the assessment and treatment plan with the patient. The patient was provided an opportunity to ask questions and all were answered.  The patient agreed with the plan and demonstrated an understanding of the instructions.   The patient was advised to call back or seek an in-person evaluation if the symptoms worsen or if the condition fails to improve as anticipated.  Time spent 20 minutes  Delorise Jackson, MD

## 2019-02-10 NOTE — Telephone Encounter (Signed)
Patient wants to discuss at appointment

## 2019-02-12 ENCOUNTER — Telehealth: Payer: Self-pay | Admitting: *Deleted

## 2019-02-12 NOTE — Telephone Encounter (Addendum)
Called pt starting the week of 01/25/19 to make her aware that MD would not be in the office the week of 02/15/19 and that her sched. 02/16/19 lab/MD appts would need to be R/S.(message left)  Another detailed message was left on patients vmail making her aware on 02/12/19. And to contact the office to R/S her lab/MD appts.

## 2019-02-16 ENCOUNTER — Inpatient Hospital Stay: Payer: BC Managed Care – PPO | Admitting: Oncology

## 2019-02-16 ENCOUNTER — Inpatient Hospital Stay: Payer: BC Managed Care – PPO

## 2019-02-25 ENCOUNTER — Encounter: Payer: Self-pay | Admitting: Oncology

## 2019-02-25 ENCOUNTER — Telehealth: Payer: Self-pay | Admitting: Pharmacy Technician

## 2019-02-25 NOTE — Telephone Encounter (Signed)
Oral Oncology Patient Advocate Encounter  Received notification from [employer] that prior authorization for Tasigna is required.  PA submitted on CoverMyMeds Key [id number]  Status is pending  Oral Oncology Clinic will continue to follow.  [place] Patient [place] Phone [phone] Fax [phone] 02/25/2019 4:19 PM

## 2019-02-26 NOTE — Telephone Encounter (Signed)
Oral Oncology Patient Advocate Encounter  Prior Authorization for Rae Lips has been approved.    PA# [id number] Effective dates: 02/25/2019 through 02/25/2020  Patient must fill through [employer].    Oral Oncology Clinic will continue to follow.   [place] Patient [place] Phone [phone] Fax [phone] 02/26/2019 9:02 AM

## 2019-03-01 ENCOUNTER — Other Ambulatory Visit: Payer: Self-pay | Admitting: Pharmacist

## 2019-03-01 DIAGNOSIS — C9210 Chronic myeloid leukemia, BCR/ABL-positive, not having achieved remission: Secondary | ICD-10-CM

## 2019-03-01 MED ORDER — NILOTINIB HCL 150 MG PO CAPS: 300.0000 mg | ORAL_CAPSULE | Freq: Two times a day (BID) | ORAL | 1 refills | Status: DC

## 2019-03-04 ENCOUNTER — Ambulatory Visit: Payer: 59 | Admitting: Internal Medicine

## 2019-03-10 ENCOUNTER — Inpatient Hospital Stay: Payer: BC Managed Care – PPO | Attending: Oncology | Admitting: Oncology

## 2019-03-10 ENCOUNTER — Encounter: Payer: Self-pay | Admitting: Oncology

## 2019-03-10 ENCOUNTER — Inpatient Hospital Stay: Payer: BC Managed Care – PPO

## 2019-03-10 ENCOUNTER — Other Ambulatory Visit: Payer: Self-pay

## 2019-03-10 VITALS — BP 129/84 | HR 92 | Temp 98.9°F | Resp 18 | Wt 174.4 lb

## 2019-03-10 DIAGNOSIS — R946 Abnormal results of thyroid function studies: Secondary | ICD-10-CM

## 2019-03-10 DIAGNOSIS — C9210 Chronic myeloid leukemia, BCR/ABL-positive, not having achieved remission: Secondary | ICD-10-CM | POA: Insufficient documentation

## 2019-03-10 DIAGNOSIS — R002 Palpitations: Secondary | ICD-10-CM | POA: Diagnosis not present

## 2019-03-10 DIAGNOSIS — E039 Hypothyroidism, unspecified: Secondary | ICD-10-CM | POA: Diagnosis not present

## 2019-03-10 DIAGNOSIS — Z136 Encounter for screening for cardiovascular disorders: Secondary | ICD-10-CM | POA: Diagnosis not present

## 2019-03-10 LAB — CBC WITH DIFFERENTIAL/PLATELET
Abs Immature Granulocytes: 0.01 10*3/uL (ref 0.00–0.07)
Basophils Absolute: 0 10*3/uL (ref 0.0–0.1)
Basophils Relative: 1 %
Eosinophils Absolute: 0.2 10*3/uL (ref 0.0–0.5)
Eosinophils Relative: 3 %
HCT: 38.4 % (ref 36.0–46.0)
Hemoglobin: 12.7 g/dL (ref 12.0–15.0)
Immature Granulocytes: 0 %
Lymphocytes Relative: 30 %
Lymphs Abs: 1.8 10*3/uL (ref 0.7–4.0)
MCH: 31.5 pg (ref 26.0–34.0)
MCHC: 33.1 g/dL (ref 30.0–36.0)
MCV: 95.3 fL (ref 80.0–100.0)
Monocytes Absolute: 0.3 10*3/uL (ref 0.1–1.0)
Monocytes Relative: 6 %
Neutro Abs: 3.6 10*3/uL (ref 1.7–7.7)
Neutrophils Relative %: 60 %
Platelets: 245 10*3/uL (ref 150–400)
RBC: 4.03 MIL/uL (ref 3.87–5.11)
RDW: 11.9 % (ref 11.5–15.5)
WBC: 5.9 10*3/uL (ref 4.0–10.5)
nRBC: 0 % (ref 0.0–0.2)

## 2019-03-10 LAB — COMPREHENSIVE METABOLIC PANEL
ALT: 22 U/L (ref 0–44)
AST: 19 U/L (ref 15–41)
Albumin: 4.2 g/dL (ref 3.5–5.0)
Alkaline Phosphatase: 57 U/L (ref 38–126)
Anion gap: 9 (ref 5–15)
BUN: 11 mg/dL (ref 6–20)
CO2: 26 mmol/L (ref 22–32)
Calcium: 9.3 mg/dL (ref 8.9–10.3)
Chloride: 104 mmol/L (ref 98–111)
Creatinine, Ser: 0.6 mg/dL (ref 0.44–1.00)
GFR calc Af Amer: >60 mL/min (ref >60)
GFR calc non Af Amer: >60 mL/min (ref >60)
Glucose, Bld: 113 mg/dL — ABNORMAL HIGH (ref 70–99)
Potassium: 3.5 mmol/L (ref 3.5–5.1)
Sodium: 139 mmol/L (ref 135–145)
Total Bilirubin: 1.2 mg/dL (ref 0.3–1.2)
Total Protein: 8.1 g/dL (ref 6.5–8.1)

## 2019-03-10 NOTE — Progress Notes (Signed)
Hematology/Oncology follow up note Jennifer English Telephone:(336) [phone] Fax:(336) [phone]   Patient Care Team: McLean-Scocuzza, Nino Glow, MD as PCP - General (Internal Medicine)  REFERRING PROVIDER: Previous oncologist from [place] Dr.Hasan Rizvi CHIEF COMPLAINTS/PURPOSE OF CONSULTATION:  Management of CML  HISTORY OF PRESENTING ILLNESS:  [name] is a  53 y.o.  female with PMH listed below who was referred to by her oncologist in [place]. [place] to [place] this summer. She has not had local pcp yet. Extensive medical records review was performed.  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 She had dyuria symptoms at that time and lab work up found extreme leukocytosis with WBC 173,500 and she was initially started on hydrea. After CML was confirmed, she was started on Tasigna 300mg  BID since then and has achieved remission. . Also had splenomegaly at presentation about 5-6 cm below LCM, resolved. . She reports being complaint on nilotinib however, she has had period of time when her medication runs out. Although she moved this summer, due to lot of stresses associated with relocation and new job, she waited until now see hematologist.  Denies any fever, chill, weight loss, bleeding events. She used to have EKG done every 3 months and faxed to oncologist. She has not had any EKG done after July this year. No PCP Also reports recently feeling urine has odor and also very mild burning sensation when passing urine. She requests to have urine tested.  Has hypothyroidism, not taking thyroid supplements.  History of iron deficiency due to heavy menstrual periods/uterine fibroids, taking iron supplements.  # 10/09/2012, bone marrow aspirate smears and clots: Markedly hypercellular marrow with prominent myeloid hyperplasia and increasing small hypolobated megakaryocytes. The overall findings are consistent with chronic myeloid leukemia, chronic  phase. # 03/22/2015 BCR ABL quantification PCR showed e13a2 (b2a2 p210) transcript <0.001%,e14a2 ( b3a2, p210)  transcript 0.023%, e1a2 (p190) transcript <0.001% # 08/17/2015 BCR ABL quantification PCR showed e13a2 (b2a2 p210) transcript 0.011%,e14a2 ( b3a2, p210)  transcript <0.001%, e1a2 (p190) transcript <0.001%  # 04/25/2016 BCR ABL quantification PCR showed e13a2 (b2a2 p210) transcript <0.001%,e14a2 ( b3a2, p210)  transcript <0.001%, e1a2 (p190) transcript <0.001%  #09/17/2016  BCR ABL quantification PCR showed e13a2 (b2a2 p210) transcript <0.0032%,e14a2 ( b3a2, p210)  transcript <0.0032%, e1a2 (p190) transcript <0.0032%   02/19/2017 BCR ABL quantification PCR showed b2a2 transcript <0.0032%, b3a2 transcript <0.0032%, E1A2 transcript <0.0032%  INTERVAL HISTORY [name] is a 53 y.o. female who has above history reviewed by me today presents for follow up of CML.  Patient reports doing well today. She has been compliant with Tasigna 300mg  BID.  Feeling well. Since last visit, her thyroid medication was increased to 50 MCG daily due to elevated TSH.  Patient felt palpitations after medication adjustment.  Palpitation symptom improved after Synthroid was readjusted to 37.5 MCG daily.  He has not had any additional palpitation episodes. Otherwise no new complaints.    Review of Systems  Constitutional: Negative for appetite change, chills, fatigue and fever.  HENT:   Negative for hearing loss and voice change.   Eyes: Negative for eye problems.  Respiratory: Negative for chest tightness and cough.   Cardiovascular: Negative for chest pain.  Gastrointestinal: Negative for abdominal distention, abdominal pain and blood in stool.  Endocrine: Negative for hot flashes.  Genitourinary: Negative for difficulty urinating and frequency.   Musculoskeletal: Negative for arthralgias.  Skin: Negative for itching and rash.  Neurological: Negative for  extremity weakness.  Hematological: Negative  for adenopathy.  Psychiatric/Behavioral: Negative for confusion. The patient is not nervous/anxious.    MEDICAL HISTORY:  Past Medical History:  Diagnosis Date  . Allergy   . CML (chronic myelocytic leukemia) ([place])   . Fibroids   . Heart murmur   . Thyroid disease    h/o thyroid d/o not on meds   . Uterine fibroid   . UTI (urinary tract infection)    x2   . 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 CESAREAN SECTION     x 1 2003   . CYSTECTOMY     middle finger right hand   . OTHER SURGICAL HISTORY     cyst removed from finger  . TONSILECTOMY, ADENOIDECTOMY, BILATERAL MYRINGOTOMY AND TUBES    . TYMPANOSTOMY TUBE PLACEMENT     1978    SOCIAL HISTORY: Social History   Socioeconomic History  . Marital status: Married    Spouse name: Not on file  . Number of children: Not on file  . Years of education: Not on file  . Highest education level: Not on file  Occupational History  . Not on file  Tobacco Use  . Smoking status: Never Smoker  . Smokeless tobacco: Never Used  Substance and Sexual Activity  . Alcohol use: No  . Drug use: No  . Sexual activity: Yes    Birth control/protection: None  Other Topics Concern  . 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 Difficulty of Paying Living Expenses: Not on file  Food Insecurity:   . Worried About Charity fundraiser in the Last Year: Not on file  . Ran Out of Food in the Last Year: Not on file  Transportation Needs:   . Lack of Transportation (Medical): Not on file  . Lack of Transportation (Non-Medical): Not on file  Physical  Activity:   . Days of Exercise per Week: Not on file  . Minutes of Exercise per Session: Not on file  Stress:   . Feeling of Stress : Not on file  Social Connections:   . Frequency of Communication with Friends and Family: Not on file  . Frequency of Social Gatherings with Friends and Family: Not on file  . Attends Religious Services: Not on file  . Active Member of Clubs or Organizations: Not on file  . Attends Archivist Meetings: Not on file  . Marital Status: Not on file  Intimate Partner Violence:   . Fear of Current or Ex-Partner: Not on file  . Emotionally Abused: Not on file  . Physically Abused: Not on file  . Sexually Abused: Not on file    FAMILY HISTORY: Family History  Problem Relation Age of Onset  . Hyperthyroidism Mother   . Heart disease Father        CAD with stents   . Hyperlipidemia Father   . Hypertension Father   . Dementia Maternal Grandfather   . Melanoma Paternal Grandmother   . Bladder Cancer Paternal Grandmother   .  Cancer Paternal Grandmother        lung/colon ?primary   . Arthritis Paternal Grandmother   . Bladder Cancer Paternal Grandfather   . Cancer Paternal Grandfather        bladder died in 40s   . Early death Paternal Grandfather   . Alcohol abuse Maternal Grandmother   . Hepatitis Maternal Grandmother   . Early death Maternal Grandmother   . Kidney disease Maternal Grandmother   . Macular degeneration Maternal Grandmother   . Liver disease Maternal Grandmother        ?alcoholic hepatitis   . Hyperthyroidism Sister   . Asthma Brother   . Asthma Brother   . Macular degeneration Maternal Aunt     ALLERGIES:  is allergic to ibuprofen; levoxyl [levothyroxine sodium]; synthroid [levothyroxine]; aspirin; avocado; eggs or egg-derived products; and penicillins.  MEDICATIONS:  Current Outpatient Medications  Medication Sig Dispense Refill  . Ascorbic Acid (VITAMIN C) 100 MG tablet Take 100 mg by mouth daily.    . fexofenadine  (ALLEGRA) 180 MG tablet Take 180 mg by mouth daily as needed for allergies or rhinitis.    . hydrocortisone 2.5 % cream Apply topically 2 (two) times daily. Right back 30 g 0  . ketoconazole (NIZORAL) 2 % cream Use 2-3 x per day x 1-3 weeks 60 g 11  . levothyroxine (SYNTHROID) 75 MCG tablet Take 0.5 tablets (37.5 mcg total) by mouth daily before breakfast. Needs lab check further refills. DC 25 and 50 mcg dose 45 tablet 3  . magnesium citrate SOLN Take 1 Bottle by mouth once.    . nilotinib (TASIGNA) 150 MG capsule Take 2 capsules (300 mg total) by mouth every 12 (twelve) hours. Take on an empty stomach, 1 hr before or 2 hrs after food. 112 capsule 1  . triamcinolone cream (KENALOG) 0.1 % Apply 1 application topically 2 (two) times daily. 60 g 0  . ferrous sulfate 325 (65 FE) MG tablet Take 325 mg by mouth daily with breakfast.     No current facility-administered medications for this visit.     PHYSICAL EXAMINATION: ECOG PERFORMANCE STATUS: 0 - Asymptomatic Vitals:   03/10/19 1442  BP: 129/84  Pulse: 92  Resp: 18  Temp: 98.9 F (37.2 C)   Filed Weights   03/10/19 1442  Weight: 174 lb 6.4 oz (79.1 kg)   Physical Exam  Constitutional: She is oriented to person, place, and time. No distress.  HENT:  Head: Normocephalic and atraumatic.  Nose: Nose normal.  Mouth/Throat: Oropharynx is clear and moist. No oropharyngeal exudate.  Eyes: Pupils are equal, round, and reactive to light. EOM are normal. No scleral icterus.  Cardiovascular: Normal rate and regular rhythm.  No murmur heard. Pulmonary/Chest: Effort normal. No respiratory distress. She has no rales. She exhibits no tenderness.  Abdominal: Soft. She exhibits no distension. There is no abdominal tenderness.  Musculoskeletal:        General: No edema. Normal range of motion.     Cervical back: Normal range of motion and neck supple.  Neurological: She is alert and oriented to person, place, and time. No cranial nerve deficit.  She exhibits normal muscle tone. Coordination normal.  Skin: Skin is warm and dry. She is not diaphoretic. No erythema.  Psychiatric: Affect normal.        LABORATORY DATA:  I have reviewed the data as listed Lab Results  Component Value Date   WBC 5.9 03/10/2019   HGB 12.7 03/10/2019   HCT 38.4  03/10/2019   MCV 95.3 03/10/2019   PLT 245 03/10/2019   Recent Labs    08/21/18 0939 08/28/18 1113 11/18/18 0953 03/10/19 1421  NA 140  --  139 139  K 3.9  --  4.0 3.5  CL 104  --  105 104  CO2 27  --  25 26  GLUCOSE 108*  --  123* 113*  BUN 11  --  10 11  CREATININE 0.76  --  0.68 0.60  CALCIUM 9.0  --  9.1 9.3  GFRNONAA >60  --  >60 >60  GFRAA >60  --  >60 >60  PROT 8.5*  --  7.8 8.1  ALBUMIN 4.5  --  4.1 4.2  AST 24  --  21 19  ALT 32  --  22 22  ALKPHOS 81  --  57 57  BILITOT 1.5* 1.3* 1.1 1.2  BILIDIR  --  0.2  --   --      ASSESSMENT & PLAN:  1. CML (chronic myelocytic leukemia) ([place])   2. Abnormal thyroid function test   3. Palpitation   #CML, in molecular remission EKG was performed and was reviewed by me Normal QTC interval 414. Labs reviewed and discussed with patient. Stable blood counts, kidney function and liver function. BCR ABL quantification is pending. Continue Tasigna 300 mg twice daily. Hypothyroidism, continue Synthroid 37.5 MCG daily.  Follow-up with primary care provider. Palpitation, likely due to Synthroid medication adjustment.  Given that palpitation has improved and no additional episodes, continue to observe.  Discussed with patient that if palpitation continues to occur, she needs to the cardiologist for further evaluation.  She agrees.  The patient knows to call the clinic with any problems questions or concerns. We spent sufficient time to discuss many aspect of care, questions were answered to patient's satisfaction.  Return of visit: 3 months.  Check CBC, CMP, BCR ABL quantification.  [name], MD, PhD Hematology Oncology [facility] Pager- [phone] 03/10/2019

## 2019-03-10 NOTE — Progress Notes (Signed)
Patient does not offer any problems today.  

## 2019-03-11 ENCOUNTER — Encounter: Payer: Self-pay | Admitting: Oncology

## 2019-03-22 LAB — BCR-ABL1, CML/ALL, PCR, QUANT: Interpretation (BCRAL):: NEGATIVE

## 2019-03-25 ENCOUNTER — Other Ambulatory Visit: Payer: Self-pay

## 2019-04-13 ENCOUNTER — Telehealth: Payer: Self-pay | Admitting: Internal Medicine

## 2019-04-13 NOTE — Telephone Encounter (Signed)
I called pt twice and left a vm to call ofc. °

## 2019-04-15 ENCOUNTER — Ambulatory Visit: Payer: Self-pay | Admitting: Internal Medicine

## 2019-04-22 ENCOUNTER — Other Ambulatory Visit: Payer: Self-pay | Admitting: Oncology

## 2019-04-22 DIAGNOSIS — C9210 Chronic myeloid leukemia, BCR/ABL-positive, not having achieved remission: Secondary | ICD-10-CM

## 2019-04-26 ENCOUNTER — Other Ambulatory Visit: Payer: BC Managed Care – PPO

## 2019-04-27 ENCOUNTER — Encounter: Payer: Self-pay | Admitting: Internal Medicine

## 2019-04-27 ENCOUNTER — Ambulatory Visit (INDEPENDENT_AMBULATORY_CARE_PROVIDER_SITE_OTHER): Payer: BC Managed Care – PPO | Admitting: Internal Medicine

## 2019-04-27 ENCOUNTER — Other Ambulatory Visit: Payer: Self-pay

## 2019-04-27 VITALS — BP 120/78 | Ht 64.0 in | Wt 165.0 lb

## 2019-04-27 DIAGNOSIS — E039 Hypothyroidism, unspecified: Secondary | ICD-10-CM | POA: Diagnosis not present

## 2019-04-27 DIAGNOSIS — E785 Hyperlipidemia, unspecified: Secondary | ICD-10-CM | POA: Insufficient documentation

## 2019-04-27 DIAGNOSIS — E559 Vitamin D deficiency, unspecified: Secondary | ICD-10-CM

## 2019-04-27 NOTE — Progress Notes (Signed)
Virtual Visit via Video Note  I connected with Jennifer English   on 04/27/19 at  2:15 PM EST by a video enabled telemedicine application and verified that I am speaking with the correct person using two identifiers.  Location patient: car Location provider:work or home office Persons participating in the virtual visit: patient, provider  I discussed the limitations of evaluation and management by telemedicine and the availability of in person appointments. The patient expressed understanding and agreed to proceed.   HPI: 1. Hypothyroidism on 1/2 pill of 75 mcg she is tolerating w/o side effects and w/o palpitations which she had at 50 mcg dose  TSH lab check had to reschedule due to covid 19 exposure at work   Results for DER, FEI (MRN [id number]) as of 04/27/2019 23:00  Ref. Range 08/21/2018 09:39 11/18/2018 09:53 12/16/2018 08:20  TSH Latest Ref Range: 0.35 - 4.50 uIU/mL 11.783 (H)  7.93 (H)  Triiodothyronine,Free,Serum Latest Ref Range: 2.0 - 4.4 pg/mL 3.0    T4,Free(Direct) Latest Ref Range: 0.82 - 1.77 ng/dL 0.79 (L)    Thyroperoxidase Ab SerPl-aCnc Latest Ref Range: 0 - 34 IU/mL 371 (H)      2. Anxiety over covid 19 exposure due to coworker testing + at work and she lives with mother in law and with her personal h/o CML she does not want to contract covid 19 and is taking all precautions possibly. Other co workers more in contact with this person were tested recently and negative she is undecided if will be tested for covid 19 as she does not currently have sx's but If she decides to be tested we discussed between 04/29/19 to 05/03/19 time window. Her coworker was not feeling well 04/19/19 and she came into contact with her and then 04/21/19 the coworker was tested and other workers did not know and she was still coming to work and results of test + 04/23/19 and she had min. 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  Past Medical History:  Diagnosis Date  . Allergy   . CML (chronic myelocytic leukemia) ([facility])   . Fibroids   . Heart murmur   . Thyroid disease    h/o thyroid d/o not on meds   . Uterine fibroid   . UTI (urinary tract infection)    x2   . 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 CESAREAN SECTION     x 1 2003   . CYSTECTOMY     middle finger right hand   . OTHER SURGICAL HISTORY     cyst removed from finger  . TONSILECTOMY, ADENOIDECTOMY, BILATERAL MYRINGOTOMY AND TUBES    . TYMPANOSTOMY TUBE PLACEMENT     1978    Family History  Problem Relation Age of Onset  . Hyperthyroidism Mother   . Heart disease Father        CAD with stents   . Hyperlipidemia Father   . Hypertension Father   . Dementia Maternal Grandfather   . Melanoma Paternal Grandmother   . Bladder Cancer Paternal Grandmother   . Cancer Paternal Grandmother        lung/colon ?primary   . Arthritis Paternal Grandmother   . Bladder Cancer Paternal Grandfather   . Cancer Paternal Grandfather        bladder  died in 33s   . Early death Paternal Grandfather   . Alcohol abuse Maternal Grandmother   . Hepatitis Maternal Grandmother   . Early death Maternal Grandmother   . Kidney disease Maternal Grandmother   . Macular degeneration Maternal Grandmother   . Liver disease Maternal Grandmother        ?alcoholic hepatitis   . Hyperthyroidism Sister   . Asthma Brother   . Asthma Brother   . 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  Ascorbic Acid (VITAMIN C) 100 MG tablet, Take 100 mg by mouth daily., Disp: , Rfl:  .  ferrous sulfate 325  (65 FE) MG tablet, Take 325 mg by mouth daily with breakfast., Disp: , Rfl:  .  fexofenadine (ALLEGRA) 180 MG tablet, Take 180 mg by mouth daily as needed for allergies or rhinitis., Disp: , Rfl:  .  levothyroxine (SYNTHROID) 75 MCG tablet, Take 0.5 tablets (37.5 mcg total) by mouth daily before breakfast. Needs lab check further refills. DC 25 and 50 mcg dose, Disp: 45 tablet, Rfl: 3 .  TASIGNA 150 MG capsule, TAKE 2 CAPSULES (300MG ) BY  MOUTH TWICE DAILY (12 HOURS APART) ON AN EMPTY STOMACH  1 HOUR BEFORE OR 2 HOURS  AFTER A MEAL, Disp: 112 capsule, Rfl: 1  EXAM:  VITALS per patient if applicable:  GENERAL: alert, oriented, appears well and in no acute distress  HEENT: atraumatic, conjunttiva clear, no obvious abnormalities on inspection of external nose and ears  NECK: normal movements of the head and neck  LUNGS: on inspection no signs of respiratory distress, breathing rate appears normal, no obvious gross SOB, gasping or wheezing  CV: no obvious cyanosis  MS: moves all visible extremities without noticeable abnormality  PSYCH/NEURO: pleasant and cooperative, no obvious depression or anxiety, speech and thought processing grossly intact  ASSESSMENT AND PLAN:  Discussed the following assessment and plan:  Hypothyroidism, unspecified type Tolerating 1/2 pill 75 mcg levothyroxine w/o palpitations  Pending TSH to check  If tolerability remains issue with thyroid medication consider referral to endocrine  If tsh remains uncontrolled consider thyroid US   Hyperlipidemia, unspecified hyperlipidemia type Given healthy diet info pt seems to be trying to eat healthy   Vitamin D deficiency Repeat vitamin D  She does not take mvt or vitamins due to c/w interference with tasigna or thyroid medications for now of note   HM-will need to do physical for 2021 in future  Declines vaccines  Will need to do pap in future and consider US/TVUS with ob/gyn for DUB/fibroids  -referred Dr.  Leonides Schanz previously has not seen as of 04/27/19 and she wants to wait   Mammogram-discussed and stressed impt of doing again today given # to call and schedule as of 04/27/19 pt wants to wait due to c/w covid 19   We need to address HM I.e pap, mammo when she is ready and stressed again today  Colonoscopy rec do once COVID resolved  Skin  -consider derm in future   -we discussed possible serious and likely etiologies, options for evaluation and workup, limitations of telemedicine visit vs in person visit, treatment, treatment risks and precautions. Pt prefers to treat via telemedicine empirically  rather then risking or undertaking an in person visit at this moment. Patient agrees to seek prompt in person care if worsening, new symptoms arise, or if is not improving with treatment.   I discussed the assessment and treatment plan with the patient. The patient was provided an opportunity to ask questions and all were answered. The patient agreed with the plan and demonstrated an understanding of the instructions.   The patient was advised to call back or seek an in-person evaluation if the symptoms worsen or if the condition fails to improve as anticipated.  Time spent 20-29 minutes  Delorise Jackson, MD

## 2019-04-27 NOTE — Patient Instructions (Signed)
High Cholesterol  High cholesterol is a condition in which the blood has high levels of a white, waxy, fat-like substance (cholesterol). The human body needs small amounts of cholesterol. The liver makes all the cholesterol that the body needs. Extra (excess) cholesterol comes from the food that we eat. Cholesterol is carried from the liver by the blood through the blood vessels. If you have high cholesterol, deposits (plaques) may build up on the walls of your blood vessels (arteries). Plaques make the arteries narrower and stiffer. Cholesterol plaques increase your risk for heart attack and stroke. Work with your health care provider to keep your cholesterol levels in a healthy range. What increases the risk? This condition is more likely to develop in people who:  Eat foods that are high in animal fat (saturated fat) or cholesterol.  Are overweight.  Are not getting enough exercise.  Have a family history of high cholesterol. What are the signs or symptoms? There are no symptoms of this condition. How is this diagnosed? This condition may be diagnosed from the results of a blood test.  If you are older than age 20, your health care provider may check your cholesterol every 4-6 years.  You may be checked more often if you already have high cholesterol or other risk factors for heart disease. The blood test for cholesterol measures:  "Bad" cholesterol (LDL cholesterol). This is the main type of cholesterol that causes heart disease. The desired level for LDL is less than 100.  "Good" cholesterol (HDL cholesterol). This type helps to protect against heart disease by cleaning the arteries and carrying the LDL away. The desired level for HDL is 60 or higher.  Triglycerides. These are fats that the body can store or burn for energy. The desired number for triglycerides is lower than 150.  Total cholesterol. This is a measure of the total amount of cholesterol in your blood, including LDL  cholesterol, HDL cholesterol, and triglycerides. A healthy number is less than 200. How is this treated? This condition is treated with diet changes, lifestyle changes, and medicines. Diet changes  This may include eating more whole grains, fruits, vegetables, nuts, and fish.  This may also include cutting back on red meat and foods that have a lot of added sugar. Lifestyle changes  Changes may include getting at least 40 minutes of aerobic exercise 3 times a week. Aerobic exercises include walking, biking, and swimming. Aerobic exercise along with a healthy diet can help you maintain a healthy weight.  Changes may also include quitting smoking. Medicines  Medicines are usually given if diet and lifestyle changes have failed to reduce your cholesterol to healthy levels.  Your health care provider may prescribe a statin medicine. Statin medicines have been shown to reduce cholesterol, which can reduce the risk of heart disease. Follow these instructions at home: Eating and drinking If told by your health care provider:  Eat chicken (without skin), fish, veal, shellfish, ground turkey breast, and round or loin cuts of red meat.  Do not eat fried foods or fatty meats, such as hot dogs and salami.  Eat plenty of fruits, such as apples.  Eat plenty of vegetables, such as broccoli, potatoes, and carrots.  Eat beans, peas, and lentils.  Eat grains such as barley, rice, couscous, and bulgur wheat.  Eat pasta without cream sauces.  Use skim or nonfat milk, and eat low-fat or nonfat yogurt and cheeses.  Do not eat or drink whole milk, cream, ice cream, egg yolks,   or hard cheeses.  Do not eat stick margarine or tub margarines that contain trans fats (also called partially hydrogenated oils).  Do not eat saturated tropical oils, such as coconut oil and palm oil.  Do not eat cakes, cookies, crackers, or other baked goods that contain trans fats.  General instructions  Exercise as  directed by your health care provider. Increase your activity level with activities such as gardening, walking, and taking the stairs.  Take over-the-counter and prescription medicines only as told by your health care provider.  Do not use any products that contain nicotine or tobacco, such as cigarettes and e-cigarettes. If you need help quitting, ask your health care provider.  Keep all follow-up visits as told by your health care provider. This is important. Contact a health care provider if:  You are struggling to maintain a healthy diet or weight.  You need help to start on an exercise program.  You need help to stop smoking. Get help right away if:  You have chest pain.  You have trouble breathing. This information is not intended to replace advice given to you by your health care provider. Make sure you discuss any questions you have with your health care provider. Document Revised: 03/14/2017 Document Reviewed: 09/09/2015 Elsevier Patient Education  [place].  Cholesterol Content in Foods Cholesterol is a waxy, fat-like substance that helps to carry fat in the blood. The body needs cholesterol in small amounts, but too much cholesterol can cause damage to the arteries and heart. Most people should eat less than 200 milligrams (mg) of cholesterol a day. Foods with cholesterol  Cholesterol is found in animal-based foods, such as meat, seafood, and dairy. Generally, low-fat dairy and lean meats have less cholesterol than full-fat dairy and fatty meats. The milligrams of cholesterol per serving (mg per serving) of common cholesterol-containing foods are listed below. Meat and other proteins  Egg -- one large whole egg has 186 mg.  Veal shank -- 4 oz has 141 mg.  Lean ground [place] (93% lean) -- 4 oz has 118 mg.  Fat-trimmed lamb loin -- 4 oz has 106 mg.  Lean ground beef (90% lean) -- 4 oz has 100 mg.  Lobster -- 3.5 oz has 90 mg.  Pork loin chops -- 4 oz has  86 mg.  Canned salmon -- 3.5 oz has 83 mg.  Fat-trimmed beef top loin -- 4 oz has 78 mg.  Frankfurter -- 1 frank (3.5 oz) has 77 mg.  Crab -- 3.5 oz has 71 mg.  Roasted chicken without skin, white meat -- 4 oz has 66 mg.  Light bologna -- 2 oz has 45 mg.  Deli-cut [place] -- 2 oz has 31 mg.  Canned tuna -- 3.5 oz has 31 mg.  Berniece Salines -- 1 oz has 29 mg.  Oysters and mussels (raw) -- 3.5 oz has 25 mg.  Mackerel -- 1 oz has 22 mg.  Trout -- 1 oz has 20 mg.  Pork sausage -- 1 link (1 oz) has 17 mg.  Salmon -- 1 oz has 16 mg.  Tilapia -- 1 oz has 14 mg. Dairy  Soft-serve ice cream --  cup (4 oz) has 103 mg.  Whole-milk yogurt -- 1 cup (8 oz) has 29 mg.  Cheddar cheese -- 1 oz has 28 mg.  American cheese -- 1 oz has 28 mg.  Whole milk -- 1 cup (8 oz) has 23 mg.  2% milk -- 1 cup (8 oz) has 18  mg.  Cream cheese -- 1 tablespoon (Tbsp) has 15 mg.  Cottage cheese --  cup (4 oz) has 14 mg.  Low-fat (1%) milk -- 1 cup (8 oz) has 10 mg.  Sour cream -- 1 Tbsp has 8.5 mg.  Low-fat yogurt -- 1 cup (8 oz) has 8 mg.  Nonfat Greek yogurt -- 1 cup (8 oz) has 7 mg.  Half-and-half cream -- 1 Tbsp has 5 mg. Fats and oils  Cod liver oil -- 1 tablespoon (Tbsp) has 82 mg.  Butter -- 1 Tbsp has 15 mg.  Lard -- 1 Tbsp has 14 mg.  Bacon grease -- 1 Tbsp has 14 mg.  Mayonnaise -- 1 Tbsp has 5-10 mg.  Margarine -- 1 Tbsp has 3-10 mg. Exact amounts of cholesterol in these foods may vary depending on specific ingredients and brands. Foods without cholesterol Most plant-based foods do not have cholesterol unless you combine them with a food that has cholesterol. Foods without cholesterol include:  Grains and cereals.  Vegetables.  Fruits.  Vegetable oils, such as olive, canola, and sunflower oil.  Legumes, such as peas, beans, and lentils.  Nuts and seeds.  Egg whites. Summary  The body needs cholesterol in small amounts, but too much cholesterol can cause damage  to the arteries and heart.  Most people should eat less than 200 milligrams (mg) of cholesterol a day. This information is not intended to replace advice given to you by your health care provider. Make sure you discuss any questions you have with your health care provider. Document Revised: 02/21/2017 Document Reviewed: 11/05/2016 Elsevier Patient Education  [place].

## 2019-05-12 ENCOUNTER — Telehealth: Payer: Self-pay | Admitting: Lab

## 2019-05-12 NOTE — Telephone Encounter (Signed)
Called Pt No answer, left VM to call office. Calling to reschedule Pt lab tomorrow to next week due to bad weather

## 2019-05-13 ENCOUNTER — Other Ambulatory Visit: Payer: BC Managed Care – PPO

## 2019-05-17 ENCOUNTER — Encounter: Payer: Self-pay | Admitting: Internal Medicine

## 2019-05-17 ENCOUNTER — Other Ambulatory Visit: Payer: Self-pay

## 2019-05-17 ENCOUNTER — Other Ambulatory Visit: Payer: Self-pay | Admitting: Internal Medicine

## 2019-05-17 ENCOUNTER — Other Ambulatory Visit (INDEPENDENT_AMBULATORY_CARE_PROVIDER_SITE_OTHER): Payer: BC Managed Care – PPO

## 2019-05-17 DIAGNOSIS — E039 Hypothyroidism, unspecified: Secondary | ICD-10-CM

## 2019-05-17 DIAGNOSIS — E559 Vitamin D deficiency, unspecified: Secondary | ICD-10-CM | POA: Diagnosis not present

## 2019-05-17 DIAGNOSIS — E785 Hyperlipidemia, unspecified: Secondary | ICD-10-CM | POA: Diagnosis not present

## 2019-05-17 LAB — LIPID PANEL
Cholesterol: 261 mg/dL — ABNORMAL HIGH (ref 0–200)
HDL: 93.6 mg/dL (ref >39.00)
LDL Cholesterol: 150 mg/dL — ABNORMAL HIGH (ref 0–99)
NonHDL: 167.43
Total CHOL/HDL Ratio: 3
Triglycerides: 85 mg/dL (ref 0.0–149.0)
VLDL: 17 mg/dL (ref 0.0–40.0)

## 2019-05-17 LAB — TSH: TSH: 14.18 u[IU]/mL — ABNORMAL HIGH (ref 0.35–4.50)

## 2019-05-17 LAB — VITAMIN D 25 HYDROXY (VIT D DEFICIENCY, FRACTURES): VITD: 16.25 ng/mL — ABNORMAL LOW (ref 30.00–100.00)

## 2019-05-17 MED ORDER — CHOLECALCIFEROL 1.25 MG (50000 UT) PO CAPS: 50000.0000 [IU] | ORAL_CAPSULE | ORAL | 1 refills | Status: DC

## 2019-05-18 NOTE — Telephone Encounter (Signed)
Mailed to address on file.

## 2019-05-19 ENCOUNTER — Encounter: Payer: Self-pay | Admitting: Internal Medicine

## 2019-05-23 ENCOUNTER — Other Ambulatory Visit: Payer: Self-pay | Admitting: Internal Medicine

## 2019-05-23 DIAGNOSIS — E039 Hypothyroidism, unspecified: Secondary | ICD-10-CM

## 2019-05-23 MED ORDER — LEVOTHYROXINE SODIUM 50 MCG PO TABS: 50.0000 ug | ORAL_TABLET | Freq: Every day | ORAL | 1 refills | Status: DC

## 2019-05-25 ENCOUNTER — Telehealth: Payer: Self-pay | Admitting: Internal Medicine

## 2019-05-25 NOTE — Telephone Encounter (Signed)
-----   Message from Delorise Jackson, MD sent at 05/17/2019  4:08 PM EST ----- Cholesterol worse  -mail cholesterol handout   Thyroid lab is elevated worse and uncontrolled  - I think she needs to see endocrinology thyroid specialist -is she agreeable to referral?   Vitamin D low normal 30-100  -I think she needs to take weekly D3 at night not with thyroid medication or Tasigna  -Is she agreeable to this?   Also thyroid medication needs to be 1st thing in the am 30 minutes before food on empty stomach and tasigna 4 hours later   Tasigna could reduce effectiveness of thyroid medication

## 2019-05-25 NOTE — Telephone Encounter (Signed)
Jennifer English, [place]  05/20/2019 9:43 AM EST    Left message to return call.   Left message to return call again today. 05/25/19.

## 2019-05-26 ENCOUNTER — Telehealth: Payer: Self-pay | Admitting: Internal Medicine

## 2019-05-26 NOTE — Telephone Encounter (Signed)
Pt was returning your call.

## 2019-05-26 NOTE — Telephone Encounter (Signed)
See previous phone encounter.

## 2019-05-26 NOTE — Telephone Encounter (Signed)
Left message to return call. Mailed letter with results and sent mychart message.

## 2019-05-26 NOTE — Telephone Encounter (Signed)
Patient was returning my call. Patient informed of results and verbalized understanding.   Handout had been mailed for cholesterol.  Patient is agreeable to weekly Vitamin D.   Patient agreeable to Endo Referral. Per Referral note Dr Cordelia Pen office reached out to schedule patient on 03/01. Gave the patient the number to call and be scheduled. Patient states she will call now.   Patient informed on how to space out medication and verbalized understanding.

## 2019-06-04 ENCOUNTER — Encounter: Payer: Self-pay | Admitting: Internal Medicine

## 2019-06-04 ENCOUNTER — Other Ambulatory Visit: Payer: Self-pay

## 2019-06-08 ENCOUNTER — Encounter: Payer: Self-pay | Admitting: Endocrinology

## 2019-06-08 ENCOUNTER — Inpatient Hospital Stay: Payer: BC Managed Care – PPO | Attending: Oncology

## 2019-06-08 ENCOUNTER — Inpatient Hospital Stay: Payer: BC Managed Care – PPO | Admitting: Oncology

## 2019-06-08 ENCOUNTER — Encounter: Payer: Self-pay | Admitting: Oncology

## 2019-06-08 ENCOUNTER — Ambulatory Visit: Payer: BC Managed Care – PPO | Admitting: Endocrinology

## 2019-06-08 ENCOUNTER — Other Ambulatory Visit: Payer: Self-pay

## 2019-06-08 VITALS — BP 124/80 | HR 86 | Ht 64.0 in | Wt 178.0 lb

## 2019-06-08 VITALS — BP 123/81 | HR 86 | Temp 97.3°F | Resp 16 | Wt 177.8 lb

## 2019-06-08 DIAGNOSIS — C9210 Chronic myeloid leukemia, BCR/ABL-positive, not having achieved remission: Secondary | ICD-10-CM

## 2019-06-08 DIAGNOSIS — C9211 Chronic myeloid leukemia, BCR/ABL-positive, in remission: Secondary | ICD-10-CM | POA: Insufficient documentation

## 2019-06-08 DIAGNOSIS — E039 Hypothyroidism, unspecified: Secondary | ICD-10-CM | POA: Insufficient documentation

## 2019-06-08 DIAGNOSIS — R946 Abnormal results of thyroid function studies: Secondary | ICD-10-CM

## 2019-06-08 LAB — CBC WITH DIFFERENTIAL/PLATELET
Abs Immature Granulocytes: 0.02 10*3/uL (ref 0.00–0.07)
Basophils Absolute: 0 10*3/uL (ref 0.0–0.1)
Basophils Relative: 1 %
Eosinophils Absolute: 0.3 10*3/uL (ref 0.0–0.5)
Eosinophils Relative: 4 %
HCT: 39.3 % (ref 36.0–46.0)
Hemoglobin: 13.3 g/dL (ref 12.0–15.0)
Immature Granulocytes: 0 %
Lymphocytes Relative: 33 %
Lymphs Abs: 2.2 10*3/uL (ref 0.7–4.0)
MCH: 31.7 pg (ref 26.0–34.0)
MCHC: 33.8 g/dL (ref 30.0–36.0)
MCV: 93.6 fL (ref 80.0–100.0)
Monocytes Absolute: 0.5 10*3/uL (ref 0.1–1.0)
Monocytes Relative: 7 %
Neutro Abs: 3.8 10*3/uL (ref 1.7–7.7)
Neutrophils Relative %: 55 %
Platelets: 251 10*3/uL (ref 150–400)
RBC: 4.2 MIL/uL (ref 3.87–5.11)
RDW: 11.9 % (ref 11.5–15.5)
WBC: 6.8 10*3/uL (ref 4.0–10.5)
nRBC: 0 % (ref 0.0–0.2)

## 2019-06-08 LAB — COMPREHENSIVE METABOLIC PANEL
ALT: 19 U/L (ref 0–44)
AST: 17 U/L (ref 15–41)
Albumin: 4.4 g/dL (ref 3.5–5.0)
Alkaline Phosphatase: 54 U/L (ref 38–126)
Anion gap: 8 (ref 5–15)
BUN: 10 mg/dL (ref 6–20)
CO2: 28 mmol/L (ref 22–32)
Calcium: 9.3 mg/dL (ref 8.9–10.3)
Chloride: 101 mmol/L (ref 98–111)
Creatinine, Ser: 0.62 mg/dL (ref 0.44–1.00)
GFR calc Af Amer: >60 mL/min (ref >60)
GFR calc non Af Amer: >60 mL/min (ref >60)
Glucose, Bld: 104 mg/dL — ABNORMAL HIGH (ref 70–99)
Potassium: 3.6 mmol/L (ref 3.5–5.1)
Sodium: 137 mmol/L (ref 135–145)
Total Bilirubin: 1.6 mg/dL — ABNORMAL HIGH (ref 0.3–1.2)
Total Protein: 8.2 g/dL — ABNORMAL HIGH (ref 6.5–8.1)

## 2019-06-08 LAB — TSH: TSH: 7.67 u[IU]/mL — ABNORMAL HIGH (ref 0.35–4.50)

## 2019-06-08 LAB — T4, FREE: Free T4: 0.76 ng/dL (ref 0.60–1.60)

## 2019-06-08 NOTE — Patient Instructions (Addendum)
Let's recheck the ultrasound.  you will receive a phone call, about a day and time for an appointment. It is best to never miss the medication.  However, if you do miss it, next best is to double up the next time.   Blood tests are requested for you today.  We'll let you know about the results.

## 2019-06-08 NOTE — Progress Notes (Signed)
Subjective:    Patient ID: Jennifer English, female    DOB: 08/29/1965, 54 y.o.   MRN: [id number]  HPI Pt is referred by Dr M-S, for hypothyroidism.  Pt reports hypothyroidism was dx'ed in approx 1992.  she was started on prescribed thyroid hormone therapy in approx 2005.  She did not tolerate synthroid 25/d (rash). She did not tolerate 50 mcg/d , or 1/2 of 75 mcg/d (palpitations). She has never taken kelp or any other type of non-prescribed thyroid product.  She has never had thyroid surgery, or XRT to the neck.  she has never been on amiodarone or lithium.   Pt says she was noted in [place] to have a thyroid nodule (2007).  She had [place] and nuc med scan.  She says she was told result was benign, but never had bx. pt states she feels well in general, except for dry skin.  Pt says she takes 50 mcg/d, with a small amount of the pill cut off, to approximate 37 mcg/d.  On this amount, palpitations are resolved.   Past Medical History:  Diagnosis Date  . Allergy   . CML (chronic myelocytic leukemia) ([place])   . Fibroids   . Heart murmur   . Thyroid disease    h/o thyroid d/o not on meds   . Uterine fibroid   . UTI (urinary tract infection)    x2   . 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 CESAREAN SECTION     x 1 2003   . CYSTECTOMY     middle finger right hand   . OTHER SURGICAL HISTORY     cyst removed from finger  . TONSILECTOMY, ADENOIDECTOMY, BILATERAL MYRINGOTOMY AND TUBES    . TYMPANOSTOMY TUBE PLACEMENT     1978    Social History   Socioeconomic History  . Marital status: Married    Spouse name: Not on file  . Number of children: Not on file  . Years of education: Not on file  . Highest education level: Not on file  Occupational History  . Not on file  Tobacco Use  . Smoking status: Never Smoker  . Smokeless tobacco: Never Used  Substance and Sexual Activity  . Alcohol use: No  . Drug use: No  . Sexual  activity: Yes    Birth control/protection: None  Other Topics Concern  . 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 Difficulty of Paying Living Expenses:   Food Insecurity:   . Worried About Charity fundraiser in the Last Year:   . Arboriculturist in the Last Year:   Transportation Needs:   . Film/video editor (Medical):   Marland Kitchen Lack of Transportation (Non-Medical):   Physical Activity:   . Days of Exercise per Week:   . Minutes of Exercise per Session:   Stress:   . Feeling of Stress :   Social Connections:   . Frequency of Communication with Friends and Family:   . Frequency of Social Gatherings with Friends and  Family:   . Attends Religious Services:   . Active Member of Clubs or Organizations:   . Attends Archivist Meetings:   Marland Kitchen Marital Status:   Intimate Partner Violence:   . Fear of Current or Ex-Partner:   . Emotionally Abused:   Marland Kitchen Physically Abused:   . Sexually Abused:     Current Outpatient Medications on File Prior to Visit  Medication Sig Dispense Refill  . Cholecalciferol 1.25 MG (50000 UT) capsule Take 1 capsule (50,000 Units total) by mouth once a week. 13 capsule 1  . fexofenadine (ALLEGRA) 180 MG tablet Take 180 mg by mouth daily as needed for allergies or rhinitis.    Marland Kitchen levothyroxine (SYNTHROID) 50 MCG tablet Take 1 tablet (50 mcg total) by mouth daily before breakfast. Needs lab check further refills and endocrine f/u. DC 25 and 37.5 mcg dose 90 tablet 1  . TASIGNA 150 MG capsule TAKE 2 CAPSULES (300MG ) BY  MOUTH TWICE DAILY (12 HOURS APART) ON AN EMPTY STOMACH  1 HOUR BEFORE OR 2 HOURS  AFTER A MEAL 112 capsule 1   No current  facility-administered medications on file prior to visit.    Allergies  Allergen Reactions  . Ibuprofen   . Levoxyl [Levothyroxine Sodium]     Rash    . Synthroid [Levothyroxine]     rash  . Aspirin Rash  . Avocado Rash  . Eggs Or Egg-Derived Products Rash  . Penicillins Rash    Family History  Problem Relation Age of Onset  . Hyperthyroidism Mother   . Heart disease Father        CAD with stents   . Hyperlipidemia Father   . Hypertension Father   . Dementia Maternal Grandfather   . Melanoma Paternal Grandmother   . Bladder Cancer Paternal Grandmother   . Cancer Paternal Grandmother        lung/colon ?primary   . Arthritis Paternal Grandmother   . Bladder Cancer Paternal Grandfather   . Cancer Paternal Grandfather        bladder died in 6s   . Early death Paternal Grandfather   . Alcohol abuse Maternal Grandmother   . Hepatitis Maternal Grandmother   . Early death Maternal Grandmother   . Kidney disease Maternal Grandmother   . Macular degeneration Maternal Grandmother   . Liver disease Maternal Grandmother        ?alcoholic hepatitis   . Hyperthyroidism Sister   . Asthma Brother   . Asthma Brother   . Macular degeneration Maternal Aunt     BP 124/80 (BP Location: Left Arm, Patient Position: Sitting, Cuff Size: Normal)   Pulse 86   Ht 5\' 4"  (1.626 m)   Wt 178 lb (80.7 kg)   SpO2 98%   BMI 30.55 kg/m   Review of Systems denies depression, hair loss, muscle cramps, weight gain, memory loss, constipation, numbness, and cold intolerance.      Objective:   Physical Exam VS: see vs page GEN: no distress HEAD: head: no deformity eyes: no periorbital swelling, no proptosis external nose and ears are normal NECK: thyroid is 3 times normal size on the right, but normal on the left.   CHEST WALL: no deformity LUNGS: clear to auscultation CV: reg rate and rhythm, no murmur.  MUSCULOSKELETAL: muscle bulk and strength are grossly normal.  no obvious joint  swelling.  gait is normal and steady EXTEMITIES: no deformity.  no edema PULSES: no carotid bruit NEURO:  cn 2-12 grossly intact.  readily moves all 4's.  sensation is intact to touch on all 4's SKIN:  Normal texture and temperature.  No rash or suspicious lesion is visible.   NODES:  None palpable at the neck PSYCH: alert, well-oriented.  Does not appear anxious nor depressed.   I have reviewed outside records, and summarized: Pt was noted to have elevated TSH, and referred here.  Wellness, dyslipidemia, and anxiety were also addressed.       Assessment & Plan:  chronic primary hypothyroidism, new to me.  Palpitations: this is limiting levothyroxine rx.  Thyroid nodule, by hx.  Unilateral fullness is noted today.    Patient Instructions  Let's recheck the ultrasound.  you will receive a phone call, about a day and time for an appointment. It is best to never miss the medication.  However, if you do miss it, next best is to double up the next time.   Blood tests are requested for you today.  We'll let you know about the results.

## 2019-06-09 NOTE — Progress Notes (Signed)
Hematology/Oncology follow up note [facility] [phone] [phone]   Patient Care Team: McLean-Scocuzza, Nino Glow, MD as PCP - General (Internal Medicine)  REFERRING PROVIDER: Previous oncologist from [place] Dr.Hasan Rizvi CHIEF COMPLAINTS/PURPOSE OF CONSULTATION:  Management of CML  HISTORY OF PRESENTING ILLNESS:  Jennifer English is a  54 y.o.  female with PMH listed below who was referred to by her oncologist in [place]. [place] to [place] this summer. She has not had local pcp yet. Extensive medical records review was performed.  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 She had dyuria symptoms at that time and lab work up found extreme leukocytosis with WBC 173,500 and she was initially started on hydrea. After CML was confirmed, she was started on Tasigna 300mg  BID since then and has achieved remission. . Also had splenomegaly at presentation about 5-6 cm below LCM, resolved. . She reports being complaint on nilotinib however, she has had period of time when her medication runs out. Although she moved this summer, due to lot of stresses associated with relocation and new job, she waited until now see hematologist.  Denies any fever, chill, weight loss, bleeding events. She used to have EKG done every 3 months and faxed to oncologist. She has not had any EKG done after July this year. No PCP Also reports recently feeling urine has odor and also very mild burning sensation when passing urine. She requests to have urine tested.  Has hypothyroidism, not taking thyroid supplements.  History of iron deficiency due to heavy menstrual periods/uterine fibroids, taking iron supplements.  # 10/09/2012, bone marrow aspirate smears and clots: Markedly hypercellular marrow with prominent myeloid hyperplasia and increasing small hypolobated megakaryocytes. The overall findings are consistent with chronic myeloid leukemia, chronic  phase. # 03/22/2015 BCR ABL quantification PCR showed e13a2 (b2a2 p210) transcript <0.001%,e14a2 ( b3a2, p210)  transcript 0.023%, e1a2 (p190) transcript <0.001% # 08/17/2015 BCR ABL quantification PCR showed e13a2 (b2a2 p210) transcript 0.011%,e14a2 ( b3a2, p210)  transcript <0.001%, e1a2 (p190) transcript <0.001%  # 04/25/2016 BCR ABL quantification PCR showed e13a2 (b2a2 p210) transcript <0.001%,e14a2 ( b3a2, p210)  transcript <0.001%, e1a2 (p190) transcript <0.001%  #09/17/2016  BCR ABL quantification PCR showed e13a2 (b2a2 p210) transcript <0.0032%,e14a2 ( b3a2, p210)  transcript <0.0032%, e1a2 (p190) transcript <0.0032%   02/19/2017 BCR ABL quantification PCR showed b2a2 transcript <0.0032%, b3a2 transcript <0.0032%, E1A2 transcript <0.0032%  INTERVAL HISTORY Jennifer English is a 54 y.o. female who has above history reviewed by me today presents for follow up of CML.  Patient reports doing well today. She has been compliant with Tasigna 300mg  BID.  Patient reports feeling well.  Occasionally she missed her evening doses. Patient establish care with endocrinology and was ordered ultrasound thyroid for further work-up.  She feels anxious about her thyroid.  Work-up. She has no new complaints today.   Review of Systems  Constitutional: Negative for appetite change, chills, fatigue and fever.  HENT:   Negative for hearing loss and voice change.   Eyes: Negative for eye problems.  Respiratory: Negative for chest tightness and cough.   Cardiovascular: Negative for chest pain.  Gastrointestinal: Negative for abdominal distention, abdominal pain and blood in stool.  Endocrine: Negative for hot flashes.  Genitourinary: Negative for difficulty urinating and frequency.   Musculoskeletal: Negative for arthralgias.  Skin: Negative for itching and rash.  Neurological: Negative for extremity weakness.  Hematological: Negative for adenopathy.  Psychiatric/Behavioral: Negative for confusion.  MEDICAL HISTORY:  Past Medical History:  Diagnosis Date  . Allergy   . CML (chronic myelocytic leukemia) ([place])   . Fibroids   . Heart murmur   . Thyroid disease    h/o thyroid d/o not on meds   . Uterine fibroid   . UTI (urinary tract infection)    x2   . 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 CESAREAN SECTION     x 1 2003   . CYSTECTOMY     middle finger right hand   . OTHER SURGICAL HISTORY     cyst removed from finger  . TONSILECTOMY, ADENOIDECTOMY, BILATERAL MYRINGOTOMY AND TUBES    . TYMPANOSTOMY TUBE PLACEMENT     1978    SOCIAL HISTORY: Social History   Socioeconomic History  . Marital status: Married    Spouse name: Not on file  . Number of children: Not on file  . Years of education: Not on file  . Highest education level: Not on file  Occupational History  . Not on file  Tobacco Use  . Smoking status: Never Smoker  . Smokeless tobacco: Never Used  Substance and Sexual Activity  . Alcohol use: No  . Drug use: No  . Sexual activity: Yes    Birth control/protection: None  Other Topics Concern  . 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 Difficulty of Paying Living Expenses:   Food Insecurity:   . Worried About Charity fundraiser in the Last Year:   . Arboriculturist in the Last Year:   Transportation Needs:   . Film/video editor (Medical):   Marland Kitchen Lack of Transportation (Non-Medical):   Physical Activity:   . Days of Exercise per Week:   . Minutes of Exercise per Session:   Stress:   . Feeling of Stress :   Social Connections:   . Frequency of  Communication with Friends and Family:   . Frequency of Social Gatherings with Friends and Family:   . Attends Religious Services:   . Active Member of Clubs or Organizations:   . Attends Archivist Meetings:   Marland Kitchen Marital Status:   Intimate Partner Violence:   . Fear of Current or Ex-Partner:   . Emotionally Abused:   Marland Kitchen Physically Abused:   . Sexually Abused:     FAMILY HISTORY: Family History  Problem Relation Age of Onset  . Hyperthyroidism Mother   . Heart disease Father        CAD with stents   . Hyperlipidemia Father   . Hypertension Father   . Dementia Maternal Grandfather   . Melanoma Paternal Grandmother   . Bladder Cancer Paternal Grandmother   . Cancer Paternal Grandmother        lung/colon ?primary   . Arthritis Paternal Grandmother   . Bladder Cancer Paternal Grandfather   . Cancer Paternal Grandfather        bladder died in 71s   . Early death Paternal Grandfather   . Alcohol abuse  Maternal Grandmother   . Hepatitis Maternal Grandmother   . Early death Maternal Grandmother   . Kidney disease Maternal Grandmother   . Macular degeneration Maternal Grandmother   . Liver disease Maternal Grandmother        ?alcoholic hepatitis   . Hyperthyroidism Sister   . Asthma Brother   . Asthma Brother   . Macular degeneration Maternal Aunt     ALLERGIES:  is allergic to ibuprofen; levoxyl [levothyroxine sodium]; synthroid [levothyroxine]; aspirin; avocado; eggs or egg-derived products; and penicillins.  MEDICATIONS:  Current Outpatient Medications  Medication Sig Dispense Refill  . Cholecalciferol 1.25 MG (50000 UT) capsule Take 1 capsule (50,000 Units total) by mouth once a week. 13 capsule 1  . fexofenadine (ALLEGRA) 180 MG tablet Take 180 mg by mouth daily as needed for allergies or rhinitis.    Marland Kitchen levothyroxine (SYNTHROID) 50 MCG tablet Take 1 tablet (50 mcg total) by mouth daily before breakfast. Needs lab check further refills and endocrine f/u. DC 25  and 37.5 mcg dose 90 tablet 1  . TASIGNA 150 MG capsule TAKE 2 CAPSULES (300MG ) BY  MOUTH TWICE DAILY (12 HOURS APART) ON AN EMPTY STOMACH  1 HOUR BEFORE OR 2 HOURS  AFTER A MEAL 112 capsule 1   No current facility-administered medications for this visit.     PHYSICAL EXAMINATION: ECOG PERFORMANCE STATUS: 0 - Asymptomatic Vitals:   06/08/19 1339  BP: 123/81  Pulse: 86  Resp: 16  Temp: (!) 97.3 F (36.3 C)  SpO2: 97%   Filed Weights   06/08/19 1339  Weight: 177 lb 12.8 oz (80.6 kg)   Physical Exam  Constitutional: She is oriented to person, place, and time. No distress.  HENT:  Head: Normocephalic and atraumatic.  Mouth/Throat: No oropharyngeal exudate.  Eyes: Pupils are equal, round, and reactive to light. EOM are normal. No scleral icterus.  Cardiovascular: Normal rate and regular rhythm.  No murmur heard. Pulmonary/Chest: Effort normal. No respiratory distress.  Abdominal: Soft. She exhibits no distension. There is no abdominal tenderness.  Musculoskeletal:        General: No edema. Normal range of motion.     Cervical back: Normal range of motion and neck supple.  Neurological: She is alert and oriented to person, place, and time. No cranial nerve deficit.  Skin: Skin is warm and dry. She is not diaphoretic. No erythema.  Psychiatric: Affect normal.        LABORATORY DATA:  I have reviewed the data as listed Lab Results  Component Value Date   WBC 6.8 06/08/2019   HGB 13.3 06/08/2019   HCT 39.3 06/08/2019   MCV 93.6 06/08/2019   PLT 251 06/08/2019   Recent Labs    08/21/18 0939 08/28/18 1113 11/18/18 0953 03/10/19 1421 06/08/19 1309  NA   < >  --  139 139 137  K   < >  --  4.0 3.5 3.6  CL   < >  --  105 104 101  CO2   < >  --  25 26 28   GLUCOSE   < >  --  123* 113* 104*  BUN   < >  --  10 11 10   CREATININE   < >  --  0.68 0.60 0.62  CALCIUM   < >  --  9.1 9.3 9.3  GFRNONAA   < >  --  >60 >60 >60  GFRAA   < >  --  >60 >60 >60  PROT   < >  --  7.8 8.1 8.2*  ALBUMIN   < >  --  4.1 4.2 4.4  AST   < >  --  21 19 17   ALT   < >  --  22 22 19   ALKPHOS   < >  --  57 57 54  BILITOT   < > 1.3* 1.1 1.2 1.6*  BILIDIR  --  0.2  --   --   --    < > = values in this interval not displayed.     ASSESSMENT & PLAN:  1. CML (chronic myelocytic leukemia) ([place])   2. Abnormal thyroid function test   3. Hyperbilirubinemia   #CML, in molecular remission EKG was performed and was reviewed by me Normal QTC interval 423 Labs reviewed and discussed with patient. Previous BCR ABL cortication showed Major molecular remission. Today's level is pending. Continue to Cigna 300 mg twice daily.  Hypothyroidism, she establish care with endocrinology. She has slightly increased total protein which is nonspecific, most likely reactive.  Discussed with patient that if the total protein is persistently elevated, I will order additional work-up.  For now just monitor.  Hyperbilirubinemia, intermittently.  Previously work-up showed normal direct bilirubin.  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 -Patient wants to have a short-term follow-up in 2 months. The patient knows to call the clinic with any problems questions or concerns. We spent sufficient time to discuss many aspect of care, questions were answered to patient's satisfaction.  Return of visit: 2 months.  Check CBC, CMP, BCR ABL quantification.  [name], MD, PhD Hematology Oncology [facility] Pager- [phone] 06/09/2019

## 2019-06-14 ENCOUNTER — Other Ambulatory Visit: Payer: Self-pay | Admitting: Oncology

## 2019-06-14 DIAGNOSIS — C9210 Chronic myeloid leukemia, BCR/ABL-positive, not having achieved remission: Secondary | ICD-10-CM

## 2019-06-14 LAB — BCR-ABL1, CML/ALL, PCR, QUANT: Interpretation (BCRAL):: NEGATIVE

## 2019-06-17 ENCOUNTER — Encounter: Payer: Self-pay | Admitting: Endocrinology

## 2019-07-06 ENCOUNTER — Ambulatory Visit
Admission: RE | Admit: 2019-07-06 | Discharge: 2019-07-06 | Disposition: A | Discharge status: Home / Self Care | Payer: BC Managed Care – PPO | Source: Ambulatory Visit | Attending: Endocrinology | Admitting: Endocrinology

## 2019-07-06 DIAGNOSIS — E041 Nontoxic single thyroid nodule: Secondary | ICD-10-CM | POA: Diagnosis not present

## 2019-07-06 DIAGNOSIS — E039 Hypothyroidism, unspecified: Secondary | ICD-10-CM

## 2019-07-11 ENCOUNTER — Encounter: Payer: Self-pay | Admitting: Endocrinology

## 2019-07-28 ENCOUNTER — Ambulatory Visit: Payer: BC Managed Care – PPO | Admitting: Internal Medicine

## 2019-08-05 ENCOUNTER — Encounter: Payer: Self-pay | Admitting: Oncology

## 2019-08-10 ENCOUNTER — Other Ambulatory Visit: Payer: Self-pay

## 2019-08-10 DIAGNOSIS — C9210 Chronic myeloid leukemia, BCR/ABL-positive, not having achieved remission: Secondary | ICD-10-CM

## 2019-08-10 NOTE — Telephone Encounter (Signed)
Just saw this message in pts chart. She is coming in for labs tomorrow (5/19). Did you want to add any additional labs?

## 2019-08-11 ENCOUNTER — Inpatient Hospital Stay: Payer: BC Managed Care – PPO | Attending: Oncology

## 2019-08-11 ENCOUNTER — Other Ambulatory Visit: Payer: Self-pay

## 2019-08-11 ENCOUNTER — Inpatient Hospital Stay (HOSPITAL_BASED_OUTPATIENT_CLINIC_OR_DEPARTMENT_OTHER): Payer: BC Managed Care – PPO | Admitting: Oncology

## 2019-08-11 ENCOUNTER — Encounter: Payer: Self-pay | Admitting: Oncology

## 2019-08-11 VITALS — BP 120/86 | HR 80 | Temp 97.6°F | Resp 16 | Wt 184.0 lb

## 2019-08-11 DIAGNOSIS — E039 Hypothyroidism, unspecified: Secondary | ICD-10-CM | POA: Insufficient documentation

## 2019-08-11 DIAGNOSIS — C9211 Chronic myeloid leukemia, BCR/ABL-positive, in remission: Secondary | ICD-10-CM | POA: Diagnosis not present

## 2019-08-11 DIAGNOSIS — R778 Other specified abnormalities of plasma proteins: Secondary | ICD-10-CM | POA: Diagnosis not present

## 2019-08-11 DIAGNOSIS — I10 Essential (primary) hypertension: Secondary | ICD-10-CM | POA: Diagnosis not present

## 2019-08-11 DIAGNOSIS — C9210 Chronic myeloid leukemia, BCR/ABL-positive, not having achieved remission: Secondary | ICD-10-CM

## 2019-08-11 LAB — COMPREHENSIVE METABOLIC PANEL
ALT: 18 U/L (ref 0–44)
AST: 19 U/L (ref 15–41)
Albumin: 4.5 g/dL (ref 3.5–5.0)
Alkaline Phosphatase: 56 U/L (ref 38–126)
Anion gap: 10 (ref 5–15)
BUN: 15 mg/dL (ref 6–20)
CO2: 29 mmol/L (ref 22–32)
Calcium: 9.3 mg/dL (ref 8.9–10.3)
Chloride: 100 mmol/L (ref 98–111)
Creatinine, Ser: 0.78 mg/dL (ref 0.44–1.00)
GFR calc Af Amer: >60 mL/min (ref >60)
GFR calc non Af Amer: >60 mL/min (ref >60)
Glucose, Bld: 92 mg/dL (ref 70–99)
Potassium: 4.1 mmol/L (ref 3.5–5.1)
Sodium: 139 mmol/L (ref 135–145)
Total Bilirubin: 1.1 mg/dL (ref 0.3–1.2)
Total Protein: 8.6 g/dL — ABNORMAL HIGH (ref 6.5–8.1)

## 2019-08-11 LAB — CBC WITH DIFFERENTIAL/PLATELET
Abs Immature Granulocytes: 0.01 10*3/uL (ref 0.00–0.07)
Basophils Absolute: 0 10*3/uL (ref 0.0–0.1)
Basophils Relative: 1 %
Eosinophils Absolute: 0.2 10*3/uL (ref 0.0–0.5)
Eosinophils Relative: 4 %
HCT: 38.8 % (ref 36.0–46.0)
Hemoglobin: 13.4 g/dL (ref 12.0–15.0)
Immature Granulocytes: 0 %
Lymphocytes Relative: 30 %
Lymphs Abs: 1.7 10*3/uL (ref 0.7–4.0)
MCH: 32.2 pg (ref 26.0–34.0)
MCHC: 34.5 g/dL (ref 30.0–36.0)
MCV: 93.3 fL (ref 80.0–100.0)
Monocytes Absolute: 0.4 10*3/uL (ref 0.1–1.0)
Monocytes Relative: 6 %
Neutro Abs: 3.2 10*3/uL (ref 1.7–7.7)
Neutrophils Relative %: 59 %
Platelets: 232 10*3/uL (ref 150–400)
RBC: 4.16 MIL/uL (ref 3.87–5.11)
RDW: 11.6 % (ref 11.5–15.5)
WBC: 5.5 10*3/uL (ref 4.0–10.5)
nRBC: 0 % (ref 0.0–0.2)

## 2019-08-11 NOTE — Progress Notes (Signed)
EKG was reviewed by Dr. [name], MD

## 2019-08-11 NOTE — Progress Notes (Signed)
Hematology/Oncology follow up note [facility] [phone] [phone]   Patient Care Team: McLean-Scocuzza, Nino Glow, MD as PCP - General (Internal Medicine)  REFERRING PROVIDER: Previous oncologist from [place] Dr.Hasan Rizvi CHIEF COMPLAINTS/PURPOSE OF CONSULTATION:  Management of CML  HISTORY OF PRESENTING ILLNESS:  Jennifer English is a  54 y.o.  female with PMH listed below who was referred to by her oncologist in [place]. [place] to [place] this summer. She has not had local pcp yet. Extensive medical records review was performed.  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 She had dyuria symptoms at that time and lab work up found extreme leukocytosis with WBC 173,500 and she was initially started on hydrea. After CML was confirmed, she was started on Tasigna 353m BID since then and has achieved remission. . Also had splenomegaly at presentation about 5-6 cm below LCM, resolved. . She reports being complaint on nilotinib however, she has had period of time when her medication runs out. Although she moved this summer, due to lot of stresses associated with relocation and new job, she waited until now see hematologist.  Denies any fever, chill, weight loss, bleeding events. She used to have EKG done every 3 months and faxed to oncologist. She has not had any EKG done after July this year. No PCP Also reports recently feeling urine has odor and also very mild burning sensation when passing urine. She requests to have urine tested.  Has hypothyroidism, not taking thyroid supplements.  History of iron deficiency due to heavy menstrual periods/uterine fibroids, taking iron supplements.  # 10/09/2012, bone marrow aspirate smears and clots: Markedly hypercellular marrow with prominent myeloid hyperplasia and increasing small hypolobated megakaryocytes. The overall findings are consistent with chronic myeloid leukemia, chronic  phase. # 03/22/2015 BCR ABL quantification PCR showed e13a2 (b2a2 p210) transcript <0.001%,e14a2 ( b3a2, p210)  transcript 0.023%, e1a2 (p190) transcript <0.001% # 08/17/2015 BCR ABL quantification PCR showed e13a2 (b2a2 p210) transcript 0.011%,e14a2 ( b3a2, p210)  transcript <0.001%, e1a2 (p190) transcript <0.001%  # 04/25/2016 BCR ABL quantification PCR showed e13a2 (b2a2 p210) transcript <0.001%,e14a2 ( b3a2, p210)  transcript <0.001%, e1a2 (p190) transcript <0.001%  #09/17/2016  BCR ABL quantification PCR showed e13a2 (b2a2 p210) transcript <0.0032%,e14a2 ( b3a2, p210)  transcript <0.0032%, e1a2 (p190) transcript <0.0032%   02/19/2017 BCR ABL quantification PCR showed b2a2 transcript <0.0032%, b3a2 transcript <0.0032%, E1A2 transcript <0.0032%  INTERVAL HISTORY Jennifer BUCKHALTERis a 54y.o. female who has above history reviewed by me today presents for follow up of CML.  Patient reports feeling well. She has no new complaints except recently she experienced right hip pain.  She is compliant with Tasigna  Review of Systems  Constitutional: Negative for appetite change, chills, fatigue and fever.  HENT:   Negative for hearing loss and voice change.   Eyes: Negative for eye problems.  Respiratory: Negative for chest tightness and cough.   Cardiovascular: Negative for chest pain.  Gastrointestinal: Negative for abdominal distention, abdominal pain and blood in stool.  Endocrine: Negative for hot flashes.  Genitourinary: Negative for difficulty urinating and frequency.   Musculoskeletal: Negative for arthralgias.  Skin: Negative for itching and rash.  Neurological: Negative for extremity weakness.  Hematological: Negative for adenopathy.  Psychiatric/Behavioral: Negative for confusion.   MEDICAL HISTORY:  Past Medical History:  Diagnosis Date  . Allergy   [place]   . Fibroids   . Heart murmur   .  Thyroid disease    h/o thyroid d/o not on meds   .  Uterine fibroid   . UTI (urinary tract infection)    x2   . 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 CESAREAN SECTION     x 1 2003   . CYSTECTOMY     middle finger right hand   . OTHER SURGICAL HISTORY     cyst removed from finger  . TONSILECTOMY, ADENOIDECTOMY, BILATERAL MYRINGOTOMY AND TUBES    . TYMPANOSTOMY TUBE PLACEMENT     1978    SOCIAL HISTORY: Social History   Socioeconomic History  . Marital status: Married    Spouse name: Not on file  . Number of children: Not on file  . Years of education: Not on file  . Highest education level: Not on file  Occupational History  . Not on file  Tobacco Use  . Smoking status: Never Smoker  . Smokeless tobacco: Never Used  Substance and Sexual Activity  . Alcohol use: No  . Drug use: No  . Sexual activity: Yes    Birth control/protection: None  Other Topics Concern  . 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 Difficulty of Paying Living Expenses:   Food Insecurity:   . Worried About Charity fundraiser in the Last Year:   . Arboriculturist in the Last Year:   Transportation Needs:   . Film/video editor (Medical):   Marland Kitchen Lack of Transportation (Non-Medical):   Physical Activity:   . Days of Exercise per Week:   . Minutes of Exercise per Session:   Stress:   . Feeling of Stress :   Social Connections:   . Frequency of Communication with Friends and Family:   . Frequency of Social Gatherings with Friends and Family:   . Attends Religious Services:   . Active Member of Clubs or Organizations:   . Attends Theatre manager Meetings:   Marland Kitchen Marital Status:   Intimate Partner Violence:   . Fear of Current or Ex-Partner:   . Emotionally Abused:   Marland Kitchen Physically Abused:   . Sexually Abused:     FAMILY HISTORY: Family History  Problem Relation Age of Onset  . Hyperthyroidism Mother   . Heart disease Father        CAD with stents   . Hyperlipidemia Father   . Hypertension Father   . Dementia Maternal Grandfather   . Melanoma Paternal Grandmother   . Bladder Cancer Paternal Grandmother   . Cancer Paternal Grandmother        lung/colon ?primary   . Arthritis Paternal Grandmother   . Bladder Cancer Paternal Grandfather   . Cancer Paternal Grandfather        bladder died in 45s   . Early death Paternal Grandfather   . Alcohol abuse Maternal Grandmother   . Hepatitis Maternal Grandmother   . Early death Maternal Grandmother   . Kidney disease Maternal Grandmother   . Macular degeneration Maternal Grandmother   .  Liver disease Maternal Grandmother        ?alcoholic hepatitis   . Hyperthyroidism Sister   . Asthma Brother   . Asthma Brother   . Macular degeneration Maternal Aunt     ALLERGIES:  is allergic to ibuprofen; levoxyl [levothyroxine sodium]; synthroid [levothyroxine]; aspirin; avocado; eggs or egg-derived products; and penicillins.  MEDICATIONS:  Current Outpatient Medications  Medication Sig Dispense Refill  . Cholecalciferol 1.25 MG (50000 UT) capsule Take 1 capsule (50,000 Units total) by mouth once a week. 13 capsule 1  . fexofenadine (ALLEGRA) 180 MG tablet Take 180 mg by mouth daily as needed for allergies or rhinitis.    Marland Kitchen levothyroxine (SYNTHROID) 50 MCG tablet Take 1 tablet (50 mcg total) by mouth daily before breakfast. Needs lab check further refills and endocrine f/u. DC 25 and 37.5 mcg dose 90 tablet 1  . TASIGNA 150 MG capsule TAKE 2 CAPSULES (300MG) BY  MOUTH TWICE DAILY (12 HOURS APART) ON AN EMPTY STOMACH  1 HOUR BEFORE OR 2 HOURS  AFTER A MEAL 112 capsule 1   No  current facility-administered medications for this visit.     PHYSICAL EXAMINATION: ECOG PERFORMANCE STATUS: 0 - Asymptomatic Vitals:   08/11/19 1320  BP: 120/86  Pulse: 80  Resp: 16  Temp: 97.6 F (36.4 C)   Filed Weights   08/11/19 1320  Weight: 184 lb (83.5 kg)   Physical Exam  Constitutional: She is oriented to person, place, and time. No distress.  HENT:  Head: Normocephalic and atraumatic.  Nose: Nose normal.  Mouth/Throat: Oropharynx is clear and moist. No oropharyngeal exudate.  Eyes: Pupils are equal, round, and reactive to light. EOM are normal. No scleral icterus.  Cardiovascular: Normal rate and regular rhythm.  No murmur heard. Pulmonary/Chest: Effort normal. No respiratory distress. She has no rales. She exhibits no tenderness.  Abdominal: Soft. She exhibits no distension. There is no abdominal tenderness.  Musculoskeletal:        General: No edema. Normal range of motion.     Cervical back: Normal range of motion and neck supple.  Neurological: She is alert and oriented to person, place, and time. No cranial nerve deficit. She exhibits normal muscle tone. Coordination normal.  Skin: Skin is warm and dry. She is not diaphoretic. No erythema.  Psychiatric: Affect normal.        LABORATORY DATA:  I have reviewed the data as listed Lab Results  Component Value Date   WBC 5.5 08/11/2019   HGB 13.4 08/11/2019   HCT 38.8 08/11/2019   MCV 93.3 08/11/2019   PLT 232 08/11/2019   Recent Labs    08/28/18 1113 11/18/18 0953 03/10/19 1421 06/08/19 1309 08/11/19 1250  NA  --    < > 139 137 139  K  --    < > 3.5 3.6 4.1  CL  --    < > 104 101 100  CO2  --    < > '26 28 29  ' GLUCOSE  --    < > 113* 104* 92  BUN  --    < > '11 10 15  ' CREATININE  --    < > 0.60 0.62 0.78  CALCIUM  --    < > 9.3 9.3 9.3  GFRNONAA  --    < > >60 >60 >60  GFRAA  --    < > >60 >60 >60  PROT  --    < > 8.1 8.2* 8.6*  ALBUMIN  --    < >  4.2 4.4 4.5  AST  --    < > '19 17 19   ' ALT  --    < > '22 19 18  ' ALKPHOS  --    < > 57 54 56  BILITOT 1.3*   < > 1.2 1.6* 1.1  BILIDIR 0.2  --   --   --   --    < > = values in this interval not displayed.     ASSESSMENT & PLAN:  1. CML (chronic myelocytic leukemia) ([place])   2. Hyperbilirubinemia   3. Elevated total protein   #CML, in molecular remission EKG was performed and was reviewed by me Normal QTC interval. Labs are reviewed and discussed with patient. BCR ABL quantification showed major molecular remission. Today's level is pending. Continue Tasigna.  300 mg twice daily.   Hyperbilirubinemia, intermittently.  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  Trepeat blood work shows complete resolution of bilirubin   Increased total protein level, reactive versus other etiologies. Check multiple myeloma panel.  Right hip pain, possible arthritis.  Advised patient to use Tylenol 650 mg every 6 hours as needed.  If no improvement, encourage patient to discuss with primary care provider for further work-up . We spent sufficient time to discuss many aspect of care, questions were answered to patient's satisfaction.  Return of visit: 3 months.  Check CBC, CMP, BCR ABL quantification.  [name], MD, PhD Hematology Oncology [facility] Pager- [phone] 08/11/2019

## 2019-08-11 NOTE — Progress Notes (Signed)
Patient reports new right side hip/leg pain.

## 2019-08-12 LAB — KAPPA/LAMBDA LIGHT CHAINS
Kappa free light chain: 22.7 mg/L — ABNORMAL HIGH (ref 3.3–19.4)
Kappa, lambda light chain ratio: 1.51 (ref 0.26–1.65)
Lambda free light chains: 15 mg/L (ref 5.7–26.3)

## 2019-08-16 LAB — MULTIPLE MYELOMA PANEL, SERUM
Albumin SerPl Elph-Mcnc: 4.2 g/dL (ref 2.9–4.4)
Albumin/Glob SerPl: 1.2 (ref 0.7–1.7)
Alpha 1: 0.2 g/dL (ref 0.0–0.4)
Alpha2 Glob SerPl Elph-Mcnc: 0.6 g/dL (ref 0.4–1.0)
B-Globulin SerPl Elph-Mcnc: 1.1 g/dL (ref 0.7–1.3)
Gamma Glob SerPl Elph-Mcnc: 1.7 g/dL (ref 0.4–1.8)
Globulin, Total: 3.6 g/dL (ref 2.2–3.9)
IgA: 208 mg/dL (ref 87–352)
IgG (Immunoglobin G), Serum: 1773 mg/dL — ABNORMAL HIGH (ref 586–1602)
IgM (Immunoglobulin M), Srm: 242 mg/dL — ABNORMAL HIGH (ref 26–217)
Total Protein ELP: 7.8 g/dL (ref 6.0–8.5)

## 2019-08-17 LAB — BCR-ABL1, CML/ALL, PCR, QUANT: Interpretation (BCRAL):: NEGATIVE

## 2019-08-18 ENCOUNTER — Other Ambulatory Visit: Payer: Self-pay | Admitting: Oncology

## 2019-08-18 DIAGNOSIS — C9210 Chronic myeloid leukemia, BCR/ABL-positive, not having achieved remission: Secondary | ICD-10-CM

## 2019-10-28 ENCOUNTER — Ambulatory Visit: Payer: BC Managed Care – PPO | Admitting: Internal Medicine

## 2019-10-29 ENCOUNTER — Other Ambulatory Visit: Payer: Self-pay

## 2019-10-29 ENCOUNTER — Encounter: Payer: Self-pay | Admitting: Internal Medicine

## 2019-10-29 ENCOUNTER — Ambulatory Visit: Payer: BC Managed Care – PPO | Admitting: Internal Medicine

## 2019-10-29 VITALS — BP 110/78 | HR 90 | Temp 99.0°F | Ht 64.02 in | Wt 192.2 lb

## 2019-10-29 DIAGNOSIS — Z0000 Encounter for general adult medical examination without abnormal findings: Secondary | ICD-10-CM

## 2019-10-29 DIAGNOSIS — Z91012 Allergy to eggs: Secondary | ICD-10-CM | POA: Insufficient documentation

## 2019-10-29 DIAGNOSIS — L309 Dermatitis, unspecified: Secondary | ICD-10-CM

## 2019-10-29 DIAGNOSIS — E039 Hypothyroidism, unspecified: Secondary | ICD-10-CM | POA: Diagnosis not present

## 2019-10-29 DIAGNOSIS — Z91018 Allergy to other foods: Secondary | ICD-10-CM

## 2019-10-29 DIAGNOSIS — E559 Vitamin D deficiency, unspecified: Secondary | ICD-10-CM

## 2019-10-29 DIAGNOSIS — E785 Hyperlipidemia, unspecified: Secondary | ICD-10-CM

## 2019-10-29 DIAGNOSIS — E042 Nontoxic multinodular goiter: Secondary | ICD-10-CM

## 2019-10-29 DIAGNOSIS — E669 Obesity, unspecified: Secondary | ICD-10-CM | POA: Insufficient documentation

## 2019-10-29 DIAGNOSIS — Z1231 Encounter for screening mammogram for malignant neoplasm of breast: Secondary | ICD-10-CM

## 2019-10-29 DIAGNOSIS — H6123 Impacted cerumen, bilateral: Secondary | ICD-10-CM

## 2019-10-29 MED ORDER — TRIAMCINOLONE ACETONIDE 0.1 % EX CREA: 1.0000 "application " | TOPICAL_CREAM | Freq: Two times a day (BID) | CUTANEOUS | 11 refills | Status: DC | PRN

## 2019-10-29 MED ORDER — LEVOTHYROXINE SODIUM 25 MCG PO TABS: 37.5000 ug | ORAL_TABLET | Freq: Every day | ORAL | 3 refills | Status: DC

## 2019-10-29 NOTE — Patient Instructions (Addendum)
Code for cologuard cpt code [phone] call and ask price   Earwax Buildup, Adult The ears produce a substance called earwax that helps keep bacteria out of the ear and protects the skin in the ear canal. Occasionally, earwax can build up in the ear and cause discomfort or hearing loss. What increases the risk? This condition is more likely to develop in people who:  Are female.  Are elderly.  Naturally produce more earwax.  Clean their ears often with cotton swabs.  Use earplugs often.  Use in-ear headphones often.  Wear hearing aids.  Have narrow ear canals.  Have earwax that is overly thick or sticky.  Have eczema.  Are dehydrated.  Have excess hair in the ear canal. What are the signs or symptoms? Symptoms of this condition include:  Reduced or muffled hearing.  A feeling of fullness in the ear or feeling that the ear is plugged.  Fluid coming from the ear.  Ear pain.  Ear itch.  Ringing in the ear.  Coughing.  An obvious piece of earwax that can be seen inside the ear canal. How is this diagnosed? This condition may be diagnosed based on:  Your symptoms.  Your medical history.  An ear exam. During the exam, your health care provider will look into your ear with an instrument called an otoscope. You may have tests, including a hearing test. How is this treated? This condition may be treated by:  Using ear drops to soften the earwax.  Having the earwax removed by a health care provider. The health care provider may: ? Flush the ear with water. ? Use an instrument that has a loop on the end (curette). ? Use a suction device.  Surgery to remove the wax buildup. This may be done in severe cases. Follow these instructions at home:   Take over-the-counter and prescription medicines only as told by your health care provider.  Do not put any objects, including cotton swabs, into your ear. You can clean the opening of your ear canal with a washcloth or  facial tissue.  Follow instructions from your health care provider about cleaning your ears. Do not over-clean your ears.  Drink enough fluid to keep your urine clear or pale yellow. This will help to thin the earwax.  Keep all follow-up visits as told by your health care provider. If earwax builds up in your ears often or if you use hearing aids, consider seeing your health care provider for routine, preventive ear cleanings. Ask your health care provider how often you should schedule your cleanings.  If you have hearing aids, clean them according to instructions from the manufacturer and your health care provider. Contact a health care provider if:  You have ear pain.  You develop a fever.  You have blood, pus, or other fluid coming from your ear.  You have hearing loss.  You have ringing in your ears that does not go away.  Your symptoms do not improve with treatment.  You feel like the room is spinning (vertigo). Summary  Earwax can build up in the ear and cause discomfort or hearing loss.  The most common symptoms of this condition include reduced or muffled hearing and a feeling of fullness in the ear or feeling that the ear is plugged.  This condition may be diagnosed based on your symptoms, your medical history, and an ear exam.  This condition may be treated by using ear drops to soften the earwax or by having the  earwax removed by a health care provider.  Do not put any objects, including cotton swabs, into your ear. You can clean the opening of your ear canal with a washcloth or facial tissue. This information is not intended to replace advice given to you by your health care provider. Make sure you discuss any questions you have with your health care provider. Document Revised: 02/21/2017 Document Reviewed: 05/22/2016 Elsevier Patient Education  2020 [employer].

## 2019-10-29 NOTE — Progress Notes (Signed)
Chief Complaint  Patient presents with  . Ear Problem    pt c/o hearing loss in left ear. Sound sounded muffled and was hearing sounds echo in left ear after hearing in right ear  . Immunization question    pt is on chemo and asks if she could recieve covid vaccine   Annual  1. This week left ear had hearing loss and muffled sound and static in the other ear lasting 1 hour. She does use Qtip to clean her ears out and put a cotton ball with vicks vapor rub in both ears. No loss of hearing and had tubes in her ears in the 3rd grade. And reports also not sure if related but hit her right cheek on the door recently w/o a bruise  2. Food allergies wants to see allergy further testing  3. She asks about tasigna and Estate manager/land agent covid shot rec disc with Dr. Tasia Catchings f/u CML 4. Hypothyroidism taking 2/3rd pill levo 50 mcg and tolerating thyroid US + MNG tsh elevated prior will check upcoming labs and f/u endocrine Dr. Loanne Drilling who disc with pt consider thyroid bx    Review of Systems  Constitutional: Negative for weight loss.  HENT: Negative for hearing loss.   Eyes: Negative for blurred vision.  Respiratory: Negative for shortness of breath.   Cardiovascular: Negative for chest pain.  Gastrointestinal: Negative for abdominal pain.  Musculoskeletal: Negative for falls.  Skin: Positive for rash.       Hand eczema  Neurological: Negative for headaches.  Psychiatric/Behavioral: Negative for depression.   Past Medical History:  Diagnosis Date  . Allergy   . CML (chronic myelocytic leukemia) ([place])   . Fibroids   . Heart murmur   . Thyroid disease    h/o thyroid d/o not on meds   . Uterine fibroid   . UTI (urinary tract infection)    x2   . 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 CESAREAN SECTION     x 1 2003   . CYSTECTOMY     middle finger right hand   . OTHER SURGICAL HISTORY     cyst removed from finger  . TONSILECTOMY,  ADENOIDECTOMY, BILATERAL MYRINGOTOMY AND TUBES    . TYMPANOSTOMY TUBE PLACEMENT     1978   Family History  Problem Relation Age of Onset  . Hyperthyroidism Mother   . Heart disease Father        CAD with stents   . Hyperlipidemia Father   . Hypertension Father   . Dementia Maternal Grandfather   . Melanoma Paternal Grandmother   . Bladder Cancer Paternal Grandmother   . Cancer Paternal Grandmother        lung/colon ?primary   . Arthritis Paternal Grandmother   . Bladder Cancer Paternal Grandfather   . Cancer Paternal Grandfather        bladder died in 74s   . Early death Paternal Grandfather   . Alcohol abuse Maternal Grandmother   . Hepatitis Maternal Grandmother   . Early death Maternal Grandmother   . Kidney disease Maternal Grandmother   . Macular degeneration Maternal Grandmother   . Liver disease Maternal Grandmother        ?alcoholic hepatitis   . Hyperthyroidism Sister   . Asthma Brother   . Asthma Brother   . Macular degeneration Maternal Aunt    Social History   Socioeconomic History  . Marital status:  Married    Spouse name: Not on file  . Number of children: Not on file  . Years of education: Not on file  . Highest education level: Not on file  Occupational History  . Not on file  Tobacco Use  . Smoking status: Never Smoker  . Smokeless tobacco: Never Used  Vaping Use  . Vaping Use: Never used  Substance and Sexual Activity  . Alcohol use: No  . Drug use: No  . Sexual activity: Yes    Birth control/protection: None  Other Topics Concern  . 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 Difficulty of Paying Living Expenses:   Food Insecurity:    . Worried About Charity fundraiser in the Last Year:   . Arboriculturist in the Last Year:   Transportation Needs:   . Film/video editor (Medical):   Marland Kitchen Lack of Transportation (Non-Medical):   Physical Activity:   . Days of Exercise per Week:   . Minutes of Exercise per Session:   Stress:   . Feeling of Stress :   Social Connections:   . Frequency of Communication with Friends and Family:   . Frequency of Social Gatherings with Friends and Family:   . Attends Religious Services:   . Active Member of Clubs or Organizations:   . Attends Archivist Meetings:   Marland Kitchen Marital Status:   Intimate Partner Violence:   . Fear of Current or Ex-Partner:   . Emotionally Abused:   Marland Kitchen Physically Abused:   . Sexually Abused:    Current Meds  Medication Sig  . Cholecalciferol 1.25 MG (50000 UT) capsule Take 1 capsule (50,000 Units total) by mouth once a week.  . fexofenadine (ALLEGRA) 180 MG tablet Take 180 mg by mouth daily as needed for allergies or rhinitis.  Marland Kitchen levothyroxine (SYNTHROID) 25 MCG tablet Take 1.5 tablets (37.5 mcg total) by mouth daily before breakfast. Needs lab check further refills and endocrine f/u. DC 25 and 37.5 mcg dose  . TASIGNA 150 MG capsule TAKE 2 CAPSULES (300MG) BY  MOUTH TWICE DAILY (12 HOURS APART) ON AN EMPTY STOMACH  1 HOUR BEFORE OR 2 HOURS  AFTER A MEAL  . [DISCONTINUED] levothyroxine (SYNTHROID) 50 MCG tablet Take 1 tablet (50 mcg total) by mouth daily before breakfast. Needs lab check further refills and endocrine f/u. DC 25 and 37.5 mcg dose   Allergies  Allergen Reactions  . Ibuprofen   . Levoxyl [Levothyroxine Sodium]     Rash    . Synthroid [Levothyroxine]     rash  . Aspirin Rash  . Avocado Rash  . Eggs Or Egg-Derived Products Rash  . Penicillins Rash   Recent Results (from the past 2160 hour(s))  Kappa/lambda light chains     Status: Abnormal   Collection Time: 08/11/19 12:50 PM  Result Value Ref Range   Kappa free light chain  22.7 (H) 3.3 - 19.4 mg/L   Lamda free light chains 15.0 5.7 - 26.3 mg/L   Kappa, lamda light chain ratio 1.51 0.26 - 1.65    Comment: (NOTE) Performed At:  [facility] [place], [place] [id number] Rush Farmer MD [id number]   Multiple Myeloma Panel (SPEP&IFE w/QIG)     Status: Abnormal   Collection Time: 08/11/19 12:50 PM  Result Value Ref Range   IgG (Immunoglobin G), Serum 1,773 (H) 586 - 1,602 mg/dL   IgA 208 87 - 352 mg/dL   IgM (Immunoglobulin M), Srm 242 (H) 26 - 217 mg/dL   Total Protein ELP 7.8 6.0 - 8.5 g/dL   Albumin SerPl Elph-Mcnc 4.2 2.9 - 4.4 g/dL   Alpha 1 0.2 0.0 - 0.4 g/dL   Alpha2 Glob SerPl Elph-Mcnc 0.6 0.4 - 1.0 g/dL   B-Globulin SerPl Elph-Mcnc 1.1 0.7 - 1.3 g/dL   Gamma Glob SerPl Elph-Mcnc 1.7 0.4 - 1.8 g/dL   M Protein SerPl Elph-Mcnc Not Observed Not Observed g/dL   Globulin, Total 3.6 2.2 - 3.9 g/dL   Albumin/Glob SerPl 1.2 0.7 - 1.7   IFE 1 Comment (A)     Comment: Polyclonal increase detected in one or more immunoglobulins.   Please Note Comment     Comment: (NOTE) Protein electrophoresis scan will follow via computer, mail, or courier delivery. Performed At: [facility] [place], [place] [id number] Rush Farmer MD [id number]   BCR-ABL1, CML/ALL, PCR, QUANT     Status: None   Collection Time: 08/11/19 12:50 PM  Result Value Ref Range   b2a2 transcript Comment %    Comment:            <0.0032 %   b3a2 transcript Comment %    Comment:            <0.0032 %   E1A2 Transcript Comment %    Comment:            <0.0032 %   Interpretation (BCRAL): Negative     Comment: (NOTE) NEGATIVE for the BCR-ABL1 e1a2 (p190), e13a2 (b2a2, p210) and e14a2 (b3a2, p210) fusion transcripts. These results do not rule out the presence of rare BCR-ABL1 transcripts not detected by this assay.    Director Review [facility]): Comment     Comment: (NOTE) Loni Muse, PhD, [facility]    Director, [place] for [place] and [place], [place] [address]    [phone]    Background: Comment     Comment: (NOTE) This assay can detect three different types of BCR-ABL1 fusion transcripts associated with CML, ALL, and AML: e13a2 (previously b2a2) and e14a2 (previously b3a2) (major breakpoint, p210), as well as e1a2 (minor breakpoint, p190). The e13a2 and e14a2 transcript values are titrated to the current International Scale (IS). The standardized baseline is 100% BCR-ABL1 (IS) and major molecular response (MMR) is equivalent to 0.1% BCR-ABL1 (IS) corresponding to a 3-log reduction. Results should be correlated with appropriate clinical and laboratory information as indicated.    Methodology Comment     Comment: (NOTE) Total RNA is isolated from the sample and subject to a real-time, reverse transcriptase polymerase chain reaction (RT-PCR). The PCR primers and probes are specific for BCR-ABL1 e13a2, e14a2 and e1a2 fusion transcripts. The ABL1 transcript is amplified as the control for cDNA quantity and quality. Serial dilutions of a validated positive control RNA with known t(9;22) BCR-ABL1 are used as reference for quantification of BCR-ABL1 relative to ABL1. The numeric BCR-ABL1 level is reported as % BCR-ABL1/ABL1 and the detection sensitivity is 4.5 log below the standard baseline (<0.0032%). This test was developed and its performance characteristics  determined by [facility]. It has not been cleared or approved by the Food and Drug Administration. Performed At: [facility] [address] [place], [place] [id number] Katina Degree MDPhD [id number] Performed At: [facility] [address] [place], [place] [id number] Katina Degree MDPhD [id number]   Comprehensive metabolic panel     Status: Abnormal   Collection Time: 08/11/19 12:50 PM  Result Value Ref Range   Sodium 139 135 - 145 mmol/L   Potassium 4.1 3.5 - 5.1 mmol/L   Chloride 100  98 - 111 mmol/L   CO2 29 22 - 32 mmol/L   Glucose, Bld 92 70 - 99 mg/dL    Comment: Glucose reference range applies only to samples taken after fasting for at least 8 hours.   BUN 15 6 - 20 mg/dL   Creatinine, Ser 0.78 0.44 - 1.00 mg/dL   Calcium 9.3 8.9 - 10.3 mg/dL   Total Protein 8.6 (H) 6.5 - 8.1 g/dL   Albumin 4.5 3.5 - 5.0 g/dL   AST 19 15 - 41 U/L   ALT 18 0 - 44 U/L   Alkaline Phosphatase 56 38 - 126 U/L   Total Bilirubin 1.1 0.3 - 1.2 mg/dL   GFR calc non Af Amer >60 >60 mL/min   GFR calc Af Amer >60 >60 mL/min   Anion gap 10 5 - 15    Comment: Performed at [facility], [address] [place], [place] [address]  CBC with Differential/Platelet     Status: None   Collection Time: 08/11/19 12:50 PM  Result Value Ref Range   WBC 5.5 4.0 - 10.5 K/uL   RBC 4.16 3.87 - 5.11 MIL/uL   Hemoglobin 13.4 12.0 - 15.0 g/dL   HCT 38.8 36 - 46 %   MCV 93.3 80.0 - 100.0 fL   MCH 32.2 26.0 - 34.0 pg   MCHC 34.5 30.0 - 36.0 g/dL   RDW 11.6 11.5 - 15.5 %   Platelets 232 150 - 400 K/uL   nRBC 0.0 0.0 - 0.2 %   Neutrophils Relative % 59 %   Neutro Abs 3.2 1.7 - 7.7 K/uL   Lymphocytes Relative 30 %   Lymphs Abs 1.7 0.7 - 4.0 K/uL   Monocytes Relative 6 %   Monocytes Absolute 0.4 0 - 1 K/uL   Eosinophils Relative 4 %   Eosinophils Absolute 0.2 0 - 0 K/uL   Basophils Relative 1 %   Basophils Absolute 0.0 0 - 0 K/uL   Immature Granulocytes 0 %   Abs Immature Granulocytes 0.01 0.00 - 0.07 K/uL    Comment: Performed at [facility] [address] [place], [place] [address]   Objective  Body mass index is 32.97 kg/m. Wt Readings from Last 3 Encounters:  10/29/19 192 lb 3.2 oz (87.2 kg)  08/11/19 184 lb (83.5 kg)  06/08/19 177 lb 12.8 oz (80.6 kg)   Temp Readings from Last 3 Encounters:  10/29/19 99 F (37.2 C)  08/11/19 97.6 F (36.4 C)  06/08/19 (!) 97.3 F (36.3 C) (Tympanic)   BP Readings from Last 3 Encounters:  10/29/19 110/78  08/11/19 120/86   06/08/19 123/81   Pulse Readings from Last 3 Encounters:  10/29/19 90  08/11/19 80  06/08/19 86    Physical Exam Vitals and nursing note reviewed.  Constitutional:      Appearance: Normal appearance. She is well-developed and well-groomed. She is obese.  HENT:     Head:  Normocephalic and atraumatic.     Right Ear: There is impacted cerumen.     Left Ear: There is impacted cerumen.  Eyes:     Conjunctiva/sclera: Conjunctivae normal.     Pupils: Pupils are equal, round, and reactive to light.  Cardiovascular:     Rate and Rhythm: Normal rate and regular rhythm.     Heart sounds: Normal heart sounds. No murmur heard.   Pulmonary:     Effort: Pulmonary effort is normal.     Breath sounds: Normal breath sounds.  Skin:    General: Skin is warm and dry.  Neurological:     General: No focal deficit present.     Mental Status: She is alert and oriented to person, place, and time. Mental status is at baseline.     Gait: Gait normal.  Psychiatric:        Attention and Perception: Attention and perception normal.        Mood and Affect: Affect normal. Mood is anxious.        Speech: Speech normal.        Behavior: Behavior normal. Behavior is cooperative.        Thought Content: Thought content normal.        Cognition and Memory: Cognition and memory normal.        Judgment: Judgment normal.     Assessment  Plan  Annual physical exam Declines vaccines but considering covid pfizer will disc with h/o   Will need to do pap in future and consider US/TVUS with ob/gyn for DUB/fibroids -referred Dr. Leonides Schanz previously has not seen as of 10/29/19 and she wants to wait and will let me know  Mammogram-discussed and stressed impt of doing again todaygiven # to call and scheduleas of 04/27/19 pt wants to wait due to c/w covid 19  Call to schedule mammogram  We need to address HM I.e pap, mammo when she is ready and stressed again today  Colonoscopy rec do once COVID resolved but pt  wants cologuard afraid of anesthesia will check as of 10/29/19 cost   Skin  -consider derm in future  rec healthy diet and exercise   Hyperlipidemia, unspecified hyperlipidemia type - Plan: Lipid panel  Hypothyroidism, unspecified type - Plan: TSH, levothyroxine (SYNTHROID) 25 MCG tablet MNG f/u Dr. Loanne Drilling disc with pt by 11/2019   Vitamin D deficiency - Plan: Vitamin D (25 hydroxy)  Hand eczema - Plan: triamcinolone cream (KENALOG) 0.1 %  Multiple food allergies - Plan: Ambulatory referral to Allergy Egg allergy - Plan: Ambulatory referral to Allergy  Bilateral impacted cerumen Consented and pt tolerated removal bl ears with curette all wax removed   Obesity (BMI 30-39.9)  rec healthy diet and exercise     Provider: Dr. Olivia Mackie [facility]

## 2019-10-31 ENCOUNTER — Telehealth: Payer: Self-pay | Admitting: Endocrinology

## 2019-10-31 NOTE — Telephone Encounter (Signed)
Please come back for a follow-up appointment in 2 months.

## 2019-11-01 NOTE — Telephone Encounter (Signed)
My chart message sent to pt advising to schedule f/u appt. Also routing this message to front desk for scheduling purposes. Below is treatment plan received from PCP:  4. Hypothyroidism taking 2/3rd pill levo 50 mcg and tolerating thyroid US + MNG tsh elevated prior will check upcoming labs and f/u endocrine Dr. Loanne Drilling who disc with pt consider thyroid bx

## 2019-11-01 NOTE — Telephone Encounter (Signed)
Sent mychart message and LMTCB to schedule follow up.

## 2019-11-04 ENCOUNTER — Other Ambulatory Visit: Payer: Self-pay

## 2019-11-04 DIAGNOSIS — C9210 Chronic myeloid leukemia, BCR/ABL-positive, not having achieved remission: Secondary | ICD-10-CM

## 2019-11-11 ENCOUNTER — Inpatient Hospital Stay: Payer: BC Managed Care – PPO | Attending: Oncology

## 2019-11-11 ENCOUNTER — Inpatient Hospital Stay (HOSPITAL_BASED_OUTPATIENT_CLINIC_OR_DEPARTMENT_OTHER): Payer: BC Managed Care – PPO | Admitting: Oncology

## 2019-11-11 ENCOUNTER — Encounter: Payer: Self-pay | Admitting: Oncology

## 2019-11-11 ENCOUNTER — Telehealth: Payer: Self-pay | Admitting: Internal Medicine

## 2019-11-11 ENCOUNTER — Other Ambulatory Visit: Payer: Self-pay

## 2019-11-11 VITALS — BP 122/86 | HR 79 | Temp 99.5°F | Wt 193.0 lb

## 2019-11-11 DIAGNOSIS — C9211 Chronic myeloid leukemia, BCR/ABL-positive, in remission: Secondary | ICD-10-CM | POA: Insufficient documentation

## 2019-11-11 DIAGNOSIS — C9210 Chronic myeloid leukemia, BCR/ABL-positive, not having achieved remission: Secondary | ICD-10-CM

## 2019-11-11 DIAGNOSIS — E039 Hypothyroidism, unspecified: Secondary | ICD-10-CM | POA: Insufficient documentation

## 2019-11-11 DIAGNOSIS — R778 Other specified abnormalities of plasma proteins: Secondary | ICD-10-CM | POA: Diagnosis not present

## 2019-11-11 LAB — COMPREHENSIVE METABOLIC PANEL
ALT: 18 U/L (ref 0–44)
AST: 19 U/L (ref 15–41)
Albumin: 4.3 g/dL (ref 3.5–5.0)
Alkaline Phosphatase: 51 U/L (ref 38–126)
Anion gap: 8 (ref 5–15)
BUN: 14 mg/dL (ref 6–20)
CO2: 28 mmol/L (ref 22–32)
Calcium: 9.1 mg/dL (ref 8.9–10.3)
Chloride: 102 mmol/L (ref 98–111)
Creatinine, Ser: 0.65 mg/dL (ref 0.44–1.00)
GFR calc Af Amer: >60 mL/min (ref >60)
GFR calc non Af Amer: >60 mL/min (ref >60)
Glucose, Bld: 108 mg/dL — ABNORMAL HIGH (ref 70–99)
Potassium: 4.1 mmol/L (ref 3.5–5.1)
Sodium: 138 mmol/L (ref 135–145)
Total Bilirubin: 0.9 mg/dL (ref 0.3–1.2)
Total Protein: 8.6 g/dL — ABNORMAL HIGH (ref 6.5–8.1)

## 2019-11-11 LAB — LIPID PANEL
Cholesterol: 255 mg/dL — ABNORMAL HIGH (ref 0–200)
HDL: 90 mg/dL (ref >40)
LDL Cholesterol: 153 mg/dL — ABNORMAL HIGH (ref 0–99)
Total CHOL/HDL Ratio: 2.8 RATIO
Triglycerides: 61 mg/dL (ref <150)
VLDL: 12 mg/dL (ref 0–40)

## 2019-11-11 LAB — CBC WITH DIFFERENTIAL/PLATELET
Abs Immature Granulocytes: 0.01 10*3/uL (ref 0.00–0.07)
Basophils Absolute: 0 10*3/uL (ref 0.0–0.1)
Basophils Relative: 1 %
Eosinophils Absolute: 0.4 10*3/uL (ref 0.0–0.5)
Eosinophils Relative: 7 %
HCT: 37.3 % (ref 36.0–46.0)
Hemoglobin: 13.3 g/dL (ref 12.0–15.0)
Immature Granulocytes: 0 %
Lymphocytes Relative: 37 %
Lymphs Abs: 1.8 10*3/uL (ref 0.7–4.0)
MCH: 32.3 pg (ref 26.0–34.0)
MCHC: 35.7 g/dL (ref 30.0–36.0)
MCV: 90.5 fL (ref 80.0–100.0)
Monocytes Absolute: 0.3 10*3/uL (ref 0.1–1.0)
Monocytes Relative: 7 %
Neutro Abs: 2.3 10*3/uL (ref 1.7–7.7)
Neutrophils Relative %: 48 %
Platelets: 228 10*3/uL (ref 150–400)
RBC: 4.12 MIL/uL (ref 3.87–5.11)
RDW: 11.7 % (ref 11.5–15.5)
WBC: 4.8 10*3/uL (ref 4.0–10.5)
nRBC: 0 % (ref 0.0–0.2)

## 2019-11-11 LAB — TSH: TSH: 9.864 u[IU]/mL — ABNORMAL HIGH (ref 0.350–4.500)

## 2019-11-11 LAB — VITAMIN D 25 HYDROXY (VIT D DEFICIENCY, FRACTURES): Vit D, 25-Hydroxy: 25.33 ng/mL — ABNORMAL LOW (ref 30–100)

## 2019-11-11 MED ORDER — NILOTINIB HCL 150 MG PO CAPS: ORAL_CAPSULE | ORAL | 1 refills | Status: DC

## 2019-11-11 NOTE — Telephone Encounter (Signed)
Pt called to see if Dr. Tasia Catchings has contact Dr. Olivia Mackie about her EKG

## 2019-11-11 NOTE — Progress Notes (Signed)
Pt here for follow up. Pt had problem with left ear approx 2 weeks ago. Pt was hearing double, but it has now resolved.

## 2019-11-11 NOTE — Progress Notes (Signed)
Hematology/Oncology follow up note [facility] Telephone:([phone] [phone]   Patient Care Team: McLean-Scocuzza, Nino Glow, MD as PCP - General (Internal Medicine)  REFERRING PROVIDER: Previous oncologist from [place] Dr.Hasan Rizvi CHIEF COMPLAINTS/PURPOSE OF CONSULTATION:  Management of CML  HISTORY OF PRESENTING ILLNESS:  Jennifer English is a  54 y.o.  female with PMH listed below who was referred to by her oncologist in [place]. [place] this summer. She has not had local pcp yet. Extensive medical records review was performed.  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 She had dyuria symptoms at that time and lab work up found extreme leukocytosis with WBC 173,500 and she was initially started on hydrea. After CML was confirmed, she was started on Tasigna 380m BID since then and has achieved remission. . Also had splenomegaly at presentation about 5-6 cm below LCM, resolved. . She reports being complaint on nilotinib however, she has had period of time when her medication runs out. Although she moved this summer, due to lot of stresses associated with relocation and new job, she waited until now see hematologist.  Denies any fever, chill, weight loss, bleeding events. She used to have EKG done every 3 months and faxed to oncologist. She has not had any EKG done after July this year. No PCP Also reports recently feeling urine has odor and also very mild burning sensation when passing urine. She requests to have urine tested.  Has hypothyroidism, not taking thyroid supplements.  History of iron deficiency due to heavy menstrual periods/uterine fibroids, taking iron supplements.  # 10/09/2012, bone marrow aspirate smears and clots: Markedly hypercellular marrow with prominent myeloid hyperplasia and increasing small hypolobated megakaryocytes. The overall findings are consistent with chronic myeloid leukemia, chronic  phase. # 03/22/2015 BCR ABL quantification PCR showed e13a2 (b2a2 p210) transcript <0.001%,e14a2 ( b3a2, p210)  transcript 0.023%, e1a2 (p190) transcript <0.001% # 08/17/2015 BCR ABL quantification PCR showed e13a2 (b2a2 p210) transcript 0.011%,e14a2 ( b3a2, p210)  transcript <0.001%, e1a2 (p190) transcript <0.001%  # 04/25/2016 BCR ABL quantification PCR showed e13a2 (b2a2 p210) transcript <0.001%,e14a2 ( b3a2, p210)  transcript <0.001%, e1a2 (p190) transcript <0.001%  #09/17/2016  BCR ABL quantification PCR showed e13a2 (b2a2 p210) transcript <0.0032%,e14a2 ( b3a2, p210)  transcript <0.0032%, e1a2 (p190) transcript <0.0032%   02/19/2017 BCR ABL quantification PCR showed b2a2 transcript <0.0032%, b3a2 transcript <0.0032%, E1A2 transcript <0.0032%  INTERVAL HISTORY Jennifer FOUTYis a 54y.o. female who has above history reviewed by me today presents for follow up of CML.  Patient reports feeling well. No new complaints. Had one episode of dizziness which she attribute to fluid in her left ear. Symptoms have resolved. No fever, chills, nausea, vomiting, chest pain, or sob.    Review of Systems  Constitutional: Negative for appetite change, chills, fatigue and fever.  HENT:   Negative for hearing loss and voice change.   Eyes: Negative for eye problems.  Respiratory: Negative for chest tightness and cough.   Cardiovascular: Negative for chest pain.  Gastrointestinal: Negative for abdominal distention, abdominal pain and blood in stool.  Endocrine: Negative for hot flashes.  Genitourinary: Negative for difficulty urinating and frequency.   Musculoskeletal: Negative for arthralgias.  Skin: Negative for itching and rash.  Neurological: Negative for extremity weakness.  Hematological: Negative for adenopathy.  Psychiatric/Behavioral: Negative for confusion.   MEDICAL HISTORY:  Past Medical History:  Diagnosis Date  . Allergy   . CML (chronic  myelocytic leukemia) ([place])   . Fibroids   .  Heart murmur   . Thyroid disease    h/o thyroid d/o not on meds   . Uterine fibroid   . UTI (urinary tract infection)    x2   . 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 CESAREAN SECTION     x 1 2003   . CYSTECTOMY     middle finger right hand   . OTHER SURGICAL HISTORY     cyst removed from finger  . TONSILECTOMY, ADENOIDECTOMY, BILATERAL MYRINGOTOMY AND TUBES    . TYMPANOSTOMY TUBE PLACEMENT     1978    SOCIAL HISTORY: Social History   Socioeconomic History  . Marital status: Married    Spouse name: Not on file  . Number of children: Not on file  . Years of education: Not on file  . Highest education level: Not on file  Occupational History  . Not on file  Tobacco Use  . Smoking status: Never Smoker  . Smokeless tobacco: Never Used  Vaping Use  . Vaping Use: Never used  Substance and Sexual Activity  . Alcohol use: No  . Drug use: No  . Sexual activity: Yes    Birth control/protection: None  Other Topics Concern  . 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 Difficulty of Paying Living Expenses: Not on file  Food Insecurity:   . Worried About Charity fundraiser in the Last Year: Not on file  . Ran Out of Food in the Last Year: Not on file  Transportation Needs:   . Lack of Transportation (Medical): Not on file  . Lack of Transportation (Non-Medical): Not on file  Physical Activity:   . Days of Exercise per Week: Not on file  . Minutes of Exercise per Session: Not on file  Stress:   . Feeling of Stress : Not on file  Social Connections:   . Frequency of  Communication with Friends and Family: Not on file  . Frequency of Social Gatherings with Friends and Family: Not on file  . Attends Religious Services: Not on file  . Active Member of Clubs or Organizations: Not on file  . Attends Archivist Meetings: Not on file  . Marital Status: Not on file  Intimate Partner Violence:   . Fear of Current or Ex-Partner: Not on file  . Emotionally Abused: Not on file  . Physically Abused: Not on file  . Sexually Abused: Not on file    FAMILY HISTORY: Family History  Problem Relation Age of Onset  . Hyperthyroidism Mother   . Heart disease Father        CAD with stents   . Hyperlipidemia Father   . Hypertension Father   . Dementia Maternal Grandfather   . Melanoma Paternal Grandmother   . Bladder Cancer Paternal Grandmother   . Cancer Paternal Grandmother        lung/colon ?primary   . Arthritis Paternal Grandmother   . Bladder Cancer Paternal Grandfather   .  Cancer Paternal Grandfather        bladder died in 52s   . Early death Paternal Grandfather   . Alcohol abuse Maternal Grandmother   . Hepatitis Maternal Grandmother   . Early death Maternal Grandmother   . Kidney disease Maternal Grandmother   . Macular degeneration Maternal Grandmother   . Liver disease Maternal Grandmother        ?alcoholic hepatitis   . Hyperthyroidism Sister   . Asthma Brother   . Asthma Brother   . Macular degeneration Maternal Aunt     ALLERGIES:  is allergic to ibuprofen, levoxyl [levothyroxine sodium], synthroid [levothyroxine], aspirin, avocado, eggs or egg-derived products, and penicillins.  MEDICATIONS:  Current Outpatient Medications  Medication Sig Dispense Refill  . fexofenadine (ALLEGRA) 180 MG tablet Take 180 mg by mouth daily as needed for allergies or rhinitis.    Marland Kitchen levothyroxine (SYNTHROID) 25 MCG tablet Take 1.5 tablets (37.5 mcg total) by mouth daily before breakfast. Needs lab check further refills and endocrine f/u. DC 25 and  37.5 mcg dose 140 tablet 3  . nilotinib (TASIGNA) 150 MG capsule TAKE 2 CAPSULES (300MG) BY  MOUTH TWICE DAILY (12 HOURS APART) ON AN EMPTY STOMACH  1 HOUR BEFORE OR 2 HOURS  AFTER A MEAL 112 capsule 1  . triamcinolone cream (KENALOG) 0.1 % Apply 1 application topically 2 (two) times daily as needed. Hand eczema 60 g 11  . Cholecalciferol 1.25 MG (50000 UT) capsule Take 1 capsule (50,000 Units total) by mouth once a week. (Patient not taking: Reported on 11/11/2019) 13 capsule 1   No current facility-administered medications for this visit.     PHYSICAL EXAMINATION: ECOG PERFORMANCE STATUS: 0 - Asymptomatic Vitals:   11/11/19 0956  BP: 122/86  Pulse: 79  Temp: 99.5 F (37.5 C)  SpO2: 98%   Filed Weights   11/11/19 0956  Weight: 193 lb (87.5 kg)   Physical Exam Constitutional:      General: She is not in acute distress.    Appearance: She is not diaphoretic.  HENT:     Head: Normocephalic and atraumatic.     Nose: Nose normal.     Mouth/Throat:     Pharynx: No oropharyngeal exudate.  Eyes:     General: No scleral icterus.    Pupils: Pupils are equal, round, and reactive to light.  Cardiovascular:     Rate and Rhythm: Normal rate and regular rhythm.     Heart sounds: No murmur heard.   Pulmonary:     Effort: Pulmonary effort is normal. No respiratory distress.     Breath sounds: No rales.  Chest:     Chest wall: No tenderness.  Abdominal:     General: There is no distension.     Palpations: Abdomen is soft.     Tenderness: There is no abdominal tenderness.  Musculoskeletal:        General: Normal range of motion.     Cervical back: Normal range of motion and neck supple.  Skin:    General: Skin is warm and dry.     Findings: No erythema.  Neurological:     Mental Status: She is alert and oriented to person, place, and time.     Cranial Nerves: No cranial nerve deficit.     Motor: No abnormal muscle tone.     Coordination: Coordination normal.  Psychiatric:         Mood and Affect: Affect normal.  LABORATORY DATA:  I have reviewed the data as listed Lab Results  Component Value Date   WBC 4.8 11/11/2019   HGB 13.3 11/11/2019   HCT 37.3 11/11/2019   MCV 90.5 11/11/2019   PLT 228 11/11/2019   Recent Labs    06/08/19 1309 08/11/19 1250 11/11/19 0933  NA 137 139 138  K 3.6 4.1 4.1  CL 101 100 102  CO2 '28 29 28  ' GLUCOSE 104* 92 108*  BUN '10 15 14  ' CREATININE 0.62 0.78 0.65  CALCIUM 9.3 9.3 9.1  GFRNONAA >60 >60 >60  GFRAA >60 >60 >60  PROT 8.2* 8.6* 8.6*  ALBUMIN 4.4 4.5 4.3  AST '17 19 19  ' ALT '19 18 18  ' ALKPHOS 54 56 51  BILITOT 1.6* 1.1 0.9     ASSESSMENT & PLAN:  1. Elevated total protein   2. CML (chronic myelocytic leukemia) ([place])   #CML, in molecular remission EKG was performed and was reviewed by me Normal QTc interval.  Labs are reviewed and discussed with patient. BCR ABL testing is pending. Previous testing showed major molecular remission,.  Continue Tasigna 363m BID.   Increased total protein level, reactive versus other etiologies. Multiple myeloma panel showed no M protein, polyclonal increase of immunoglobulins. Normal free light chian ration. Discussed with pt that this is likely due to increased chronic inflammation, follow up with PCP.   # EKG changes- ?septal infart, age indeterminate. . I do not see any acute significant changes comparing to her previous EKGs.  I recommend pt to further discuss with primary care. Cc Dr.Tracy Mclean to see if anything need to be done. Lipid panel is pending.  Discussed with her about CDC recommendation of COVID 19 vaccination. No restriction from oncology aspect for vaccination.  . We spent sufficient time to discuss many aspect of care, questions were answered to patient's satisfaction.  Return of visit: 3 months.  Check CBC, CMP, BCR ABL quantification.  ZEarlie Server MD, PhD Hematology Oncology CSur[facility] Of [facility] LLCat AKindred Hospital Boston - North ShorePager-  [phone]/19/2021

## 2019-11-12 ENCOUNTER — Encounter: Payer: Self-pay | Admitting: Internal Medicine

## 2019-11-15 ENCOUNTER — Encounter: Payer: Self-pay | Admitting: Family

## 2019-11-15 ENCOUNTER — Other Ambulatory Visit: Payer: Self-pay | Admitting: Family

## 2019-11-15 ENCOUNTER — Telehealth: Payer: Self-pay

## 2019-11-15 DIAGNOSIS — R011 Cardiac murmur, unspecified: Secondary | ICD-10-CM

## 2019-11-15 NOTE — Telephone Encounter (Signed)
-----   Message from Renato Shin, MD sent at 11/15/2019  7:31 AM EDT ----- please contact patient:  Needs f/u next avail ----- Message ----- From: McLean-Scocuzza, Nino Glow, MD Sent: 11/14/2019   8:35 PM EDT To: Renato Shin, MD, Thressa Sheller, CMA  Vitamin D still low but improved  -low rec D3 4000 to no more than 5000 IU daily otc   Hypothyroidism is uncontrolled on 37.5 mg dose -will ask endocrine to make recommendations about this   Cholesterol uncontrolled and elevated  -mail cholesterol handout  -rec healthy diet and exercise  -is pt agreeable to see cardiology about EKG changes to see if they need to do further work up or treatment recommendations and cholesterol?  -if so refer to Dr. Saunders Revel   Protein slightly elevated but stable  increase water intake 55-64 ounces daily   Sugar slightly elevated  -in the future we will do POC A1C in office   Blood cts normal   1 hematology lab pending

## 2019-11-15 NOTE — Telephone Encounter (Signed)
Received lab results from PCP. Per Dr. Loanne Drilling, pt will need to schedule an appt to further discuss. My Chart message sent to pt advising to call to schedule an appt.

## 2019-11-15 NOTE — Telephone Encounter (Signed)
What I could find in Dr Collie Siad note regarding EKG:  "# EKG changes- ?septal infart, age indeterminate. . I do not see any acute significant changes comparing to her previous EKGs.  I recommend pt to further discuss with primary care. Cc Dr.Tracy Mclean to see if anything need to be done. Lipid panel is pending.  Discussed with her about CDC recommendation of COVID 19 vaccination. No restriction from oncology aspect for vaccination. "  Please advise

## 2019-11-15 NOTE — Telephone Encounter (Signed)
Patient calling back in and was informed of Margaret's message.   Patient verbalized understanding and is agreeable to Cardiology referral. States she is not having any of the acute symptoms.

## 2019-11-15 NOTE — Telephone Encounter (Signed)
Please see 11/12/19 Patient message encounter

## 2019-11-17 LAB — BCR-ABL1, CML/ALL, PCR, QUANT: Interpretation (BCRAL):: NEGATIVE

## 2019-11-18 ENCOUNTER — Telehealth: Payer: Self-pay | Admitting: Internal Medicine

## 2019-11-18 NOTE — Telephone Encounter (Signed)
Patient returned office phone call for results 

## 2019-11-18 NOTE — Telephone Encounter (Signed)
Patient informed and verbalized understanding.  See result note for details  

## 2019-11-22 ENCOUNTER — Encounter: Payer: Self-pay | Admitting: Oncology

## 2019-12-03 ENCOUNTER — Telehealth: Payer: Self-pay | Admitting: Internal Medicine

## 2019-12-03 NOTE — Telephone Encounter (Signed)
Pt called and wanted a call back about a recent EKG that she had done at Dr. Tasia Catchings

## 2019-12-03 NOTE — Telephone Encounter (Signed)
Jennifer Hawthorne, FNP to Me     1:26 PM Call pt  I am covering for mclean   She how she is feeling  Has she ever had cardiac work up or seen cardiology?  I see h/o murmur and strong family h/o heart disease   I reviewed EKG with Dr Tasia Catchings  No acute changes  Certainly if she is having acute concerns ANY cp, sob, frequent palpitations, that would require ED   If not, I can certainly refer to cardiology and have placed that referral today  Let us know if you dont hear back within a week in regards to an appointment being scheduled.    _  8:30 AM You routed this conversation to Jennifer Hawthorne, FNP  Me  8:30 AM Note What I could find in Dr Collie Siad note regarding EKG:  "# EKG changes- ?septal infart, age indeterminate. . I do not see any acute significant changes comparing to her previous EKGs.  I recommend pt to further discuss with primary care. Cc Dr.Tracy Mclean to see if anything need to be done. Lipid panel is pending. Discussed with her about CDC recommendation of COVID 19 vaccination. No restriction from oncology aspect for vaccination."  Please advise      November 12, 2019 Wandra Scot to McLean-Scocuzza, Nino Glow, MD   CI  7:51 PM Hi Dr Aundra Dubin,  I saw oncology yesterday, and Dr. Tasia Catchings ordered an EKG as always. There was an issue with mine. Dr Tasia Catchings had said she would reach out to you. it showed an infarction. Of course I got upset.. I do in all honesty think the person who did it, put the leads on wrong. I remembered thinking that, when she put them on my outer wrists.Rene Paci had so many that I kind of know where the leads go, but didn't think too much of it initially, until Dr Tasia Catchings came in to tell me.  I'm of course concerned. Please advise. I of course would like a new EKG preferably with a cardiologist just in case there is an issue. I get palipitations occasionally, and I haven't had any in a while or any concerning symptoms..  Thank you  Elon Spanner

## 2019-12-03 NOTE — Telephone Encounter (Signed)
Patient calling back in as she is concerned about there being an slight abnormality on her recent EKG. She wanted to know if Dr Olivia Mackie McLean-Scocuzza can look at this and give her thoughts.   Patient had an EKG on 11/11/19 with Dr Tasia Catchings. She is set to see Cardiology this coming Thursday.   Patient wanting Dr Olivia Mackie McLean-Scocuzza's opinion on if she needs to take swifter action than being seen on Thursday. States that a family member had slight abnormalities on their EKG's before and ended up needing stents placed.

## 2019-12-08 ENCOUNTER — Encounter: Payer: Self-pay | Admitting: Internal Medicine

## 2019-12-08 NOTE — Telephone Encounter (Signed)
please contact patient: F/u is due 

## 2019-12-08 NOTE — Telephone Encounter (Signed)
Please refer to Dr. Ellison's request below 

## 2019-12-08 NOTE — Telephone Encounter (Signed)
Agree with cardiology referral for abnormal EKG   Dr. Loanne Drilling please manage hypothyroidism uncontrolled   Thanks Monson

## 2019-12-09 ENCOUNTER — Ambulatory Visit: Payer: BC Managed Care – PPO | Admitting: Cardiovascular Disease

## 2019-12-09 ENCOUNTER — Encounter: Payer: Self-pay | Admitting: Cardiovascular Disease

## 2019-12-09 ENCOUNTER — Other Ambulatory Visit: Payer: Self-pay

## 2019-12-09 VITALS — BP 120/80 | HR 82 | Ht 64.0 in | Wt 190.5 lb

## 2019-12-09 DIAGNOSIS — Z79899 Other long term (current) drug therapy: Secondary | ICD-10-CM

## 2019-12-09 DIAGNOSIS — R011 Cardiac murmur, unspecified: Secondary | ICD-10-CM

## 2019-12-09 NOTE — Patient Instructions (Signed)
Medication Instructions:  Your physician recommends that you continue on your current medications as directed. Please refer to the Current Medication list given to you today.  *If you need a refill on your cardiac medications before your next appointment, please call your pharmacy*   Lab Work: None ordered If you have labs (blood work) drawn today and your tests are completely normal, you will receive your results only by: . MyChart Message (if you have MyChart) OR . A paper copy in the mail If you have any lab test that is abnormal or we need to change your treatment, we will call you to review the results.   Testing/Procedures: None ordered   Follow-Up: At [facility], you and your health needs are our priority.  As part of our continuing mission to provide you with exceptional heart care, we have created designated Provider Care Teams.  These Care Teams include your primary Cardiologist (physician) and Advanced Practice Providers (APPs -  Physician Assistants and Nurse Practitioners) who all work together to provide you with the care you need, when you need it.  We recommend signing up for the patient portal called "MyChart".  Sign up information is provided on this After Visit Summary.  MyChart is used to connect with patients for Virtual Visits (Telemedicine).  Patients are able to view lab/test results, encounter notes, upcoming appointments, etc.  Non-urgent messages can be sent to your provider as well.   To learn more about what you can do with MyChart, go to [email or web address]://[email or web address].com.    Your next appointment:   As needed   The format for your next appointment:   In Person  Provider:    You may see  Dr. Arida or one of the following Advanced Practice Providers on your designated Care Team:    Christopher Berge, NP  Ryan Dunn, PA-C  Jacquelyn Visser, PA-C    Other Instructions N/A  

## 2019-12-09 NOTE — Progress Notes (Signed)
Cardiology Office Note   Date:  12/09/2019   ID:  Jennifer English, DOB Sep 22, 1965, MRN [id number]  PCP:  McLean-Scocuzza, Nino Glow, MD  Cardiologist:   Kathlyn Sacramento, MD   Chief Complaint  Patient presents with  . New Patient (Initial Visit)    Evaluation of cardiac murmur; Meds verbally reviewed with patient.      History of Present Illness: Jennifer English is a 54 y.o. female who was referred by Dr. Terese Door for evaluation of an abnormal EKG.  She has no prior cardiac history.  She reports having palpitations in the past and underwent cardiac work-up in [place] which was unremarkable.  She has history of hypothyroidism on replacement. 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  Since the significant cause QT prolongation, she has been having an EKG done every 3 months.  She had a routine EKG done recently which was suggestive of possible septal infarct and thus she was referred to see [place].  She denies any chest pain, shortness of breath or palpitations.  She is not a smoker.  Family history is negative for premature coronary artery disease.  Her father did have cardiac stents but he was in his late 3s. She has been told about possibly having a cardiac murmur.  Past Medical History:  Diagnosis Date  . Allergy   . CML (chronic myelocytic leukemia) ([place])   . Fibroids   . Heart murmur   . Thyroid disease    h/o thyroid d/o not on meds   . Uterine fibroid   . UTI (urinary tract infection)    x2   . 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 CESAREAN SECTION     x 1 2003   . CYSTECTOMY     middle finger right hand   . OTHER SURGICAL HISTORY     cyst removed from finger  . TONSILECTOMY, ADENOIDECTOMY, BILATERAL MYRINGOTOMY AND TUBES    . TYMPANOSTOMY TUBE PLACEMENT     1978     Current Outpatient Medications  Medication Sig Dispense Refill  .  Cholecalciferol 1.25 MG (50000 UT) capsule Takes occassionally    . fexofenadine (ALLEGRA) 180 MG tablet Take 180 mg by mouth daily as needed for allergies or rhinitis.    Marland Kitchen levothyroxine (SYNTHROID) 25 MCG tablet Take 1.5 tablets (37.5 mcg total) by mouth daily before breakfast. Needs lab check further refills and endocrine f/u. DC 25 and 37.5 mcg dose 140 tablet 3  . nilotinib (TASIGNA) 150 MG capsule TAKE 2 CAPSULES (300MG ) BY  MOUTH TWICE DAILY (12 HOURS APART) ON AN EMPTY STOMACH  1 HOUR BEFORE OR 2 HOURS  AFTER A MEAL 112 capsule 1  . triamcinolone cream (KENALOG) 0.1 % Apply 1 application topically 2 (two) times daily as needed. Hand eczema 60 g 11  . Cholecalciferol 1.25 MG (50000 UT) capsule Take 1 capsule (50,000 Units total) by mouth once a week. (Patient not taking: Reported on 11/11/2019) 13 capsule 1   No current facility-administered medications for this visit.    Allergies:   Ibuprofen, Levoxyl [levothyroxine sodium], Synthroid [levothyroxine], Aspirin, Avocado, Eggs or egg-derived products, and Penicillins    Social History:  The patient  reports that she has never smoked. She has never used smokeless tobacco. She reports that she does not drink alcohol and does not use drugs.  Family History:  The patient's family history includes Alcohol abuse in her maternal grandmother; Arthritis in her paternal grandmother; Asthma in her brother and brother; Bladder Cancer in her paternal grandfather and paternal grandmother; Cancer in her paternal grandfather and paternal grandmother; Dementia in her maternal grandfather; Early death in her maternal grandmother and paternal grandfather; Heart disease in her father; Hepatitis in her maternal grandmother; Hyperlipidemia in her father; Hypertension in her father; Hyperthyroidism in her mother and sister; Kidney disease in her maternal grandmother; Liver disease in her maternal grandmother; Macular degeneration in her maternal aunt and maternal  grandmother; Melanoma in her paternal grandmother.    ROS:  Please see the history of present illness.   Otherwise, review of systems are positive for none.   All other systems are reviewed and negative.    PHYSICAL EXAM: VS:  BP 120/80 (BP Location: Right Arm, Patient Position: Sitting, Cuff Size: Normal)   Ht 5\' 4"  (1.626 m)   Wt 190 lb 8 oz (86.4 kg)   SpO2 99%   BMI 32.70 kg/m  , BMI Body mass index is 32.7 kg/m. GEN: Well nourished, well developed, in no acute distress  HEENT: normal  Neck: no JVD, carotid bruits, or masses Cardiac: RRR; no murmurs, rubs, or gallops,no edema  Respiratory:  clear to auscultation bilaterally, normal work of breathing GI: soft, nontender, nondistended, + BS MS: no deformity or atrophy  Skin: warm and dry, no rash Neuro:  Strength and sensation are intact Psych: euthymic mood, full affect   EKG:  EKG is ordered today. The ekg ordered today demonstrates normal sinus rhythm with no significant ST or T wave changes.  Normal QT interval with QTC of 429.   Recent Labs: 11/11/2019: ALT 18; BUN 14; Creatinine, Ser 0.65; Hemoglobin 13.3; Platelets 228; Potassium 4.1; Sodium 138; TSH 9.864    Lipid Panel    Component Value Date/Time   CHOL 255 (H) 11/11/2019 0933   TRIG 61 11/11/2019 0933   HDL 90 11/11/2019 0933   CHOLHDL 2.8 11/11/2019 0933   VLDL 12 11/11/2019 0933   LDLCALC 153 (H) 11/11/2019 0933      Wt Readings from Last 3 Encounters:  12/09/19 190 lb 8 oz (86.4 kg)  11/11/19 193 lb (87.5 kg)  10/29/19 192 lb 3.2 oz (87.2 kg)        PAD Screen 12/09/2019  Previous PAD dx? No  Previous surgical procedure? No  Pain with walking? No  Feet/toe relief with dangling? No  Painful, non-healing ulcers? No  Extremities discolored? No      ASSESSMENT AND PLAN:  1.  Monitoring of medication: She is on Tasigna long-term for CML which can cause QT prolongation.  However, she has not had any issues since 2014.    Her EKG today is  completely normal and there is no evidence of septal infarct.  I reviewed her recent EKG done in August and I suspect there was some lead misplacement.  In addition, there was small positive deflection before the QRS and thus the EKG was not consistent with septal infarct.  She has no cardiac symptoms and her exam is unremarkable.  She does not require further cardiac work-up.  Given that her QT has been stable for so many years on this medication, should consider decreasing the frequency of checking her EKG to every 6 months or every 12 months.  2.  Reported cardiac murmur: No murmur is appreciated today.    Disposition:   FU with me as  needed  Signed,  Kathlyn Sacramento, MD  12/09/2019 8:46 AM    [place]

## 2019-12-10 ENCOUNTER — Ambulatory Visit: Payer: BC Managed Care – PPO | Admitting: Cardiology

## 2019-12-15 ENCOUNTER — Encounter: Payer: Self-pay | Admitting: Internal Medicine

## 2019-12-30 ENCOUNTER — Other Ambulatory Visit: Payer: Self-pay

## 2019-12-30 ENCOUNTER — Encounter: Payer: Self-pay | Admitting: Internal Medicine

## 2019-12-30 ENCOUNTER — Ambulatory Visit: Payer: BC Managed Care – PPO | Admitting: Internal Medicine

## 2019-12-30 VITALS — BP 132/84 | HR 88 | Temp 99.1°F | Ht 64.0 in | Wt 192.0 lb

## 2019-12-30 DIAGNOSIS — K649 Unspecified hemorrhoids: Secondary | ICD-10-CM

## 2019-12-30 DIAGNOSIS — E039 Hypothyroidism, unspecified: Secondary | ICD-10-CM

## 2019-12-30 DIAGNOSIS — Z1211 Encounter for screening for malignant neoplasm of colon: Secondary | ICD-10-CM

## 2019-12-30 MED ORDER — HYDROCORTISONE (PERIANAL) 2.5 % EX CREA: 1.0000 "application " | TOPICAL_CREAM | Freq: Three times a day (TID) | CUTANEOUS | 11 refills | Status: DC

## 2019-12-30 NOTE — Patient Instructions (Addendum)
Dr. Loanne Drilling [phone] endocrine  Tucks/preparation H Colace stool softner otc Or  Combination senna (laxative)/colace (stool softner) Increase fiber via diet   Constipation, Adult Constipation is when a person has fewer bowel movements in a week than normal, has difficulty having a bowel movement, or has stools that are dry, hard, or larger than normal. Constipation may be caused by an underlying condition. It may become worse with age if a person takes certain medicines and does not take in enough fluids. Follow these instructions at home: Eating and drinking   Eat foods that have a lot of fiber, such as fresh fruits and vegetables, whole grains, and beans.  Limit foods that are high in fat, low in fiber, or overly processed, such as french fries, hamburgers, cookies, candies, and soda.  Drink enough fluid to keep your urine clear or pale yellow. General instructions  Exercise regularly or as told by your health care provider.  Go to the restroom when you have the urge to go. Do not hold it in.  Take over-the-counter and prescription medicines only as told by your health care provider. These include any fiber supplements.  Practice pelvic floor retraining exercises, such as deep breathing while relaxing the lower abdomen and pelvic floor relaxation during bowel movements.  Watch your condition for any changes.  Keep all follow-up visits as told by your health care provider. This is important. Contact a health care provider if:  You have pain that gets worse.  You have a fever.  You do not have a bowel movement after 4 days.  You vomit.  You are not hungry.  You lose weight.  You are bleeding from the anus.  You have thin, pencil-like stools. Get help right away if:  You have a fever and your symptoms suddenly get worse.  You leak stool or have blood in your stool.  Your abdomen is bloated.  You have severe pain in your abdomen.  You feel dizzy or you  faint. This information is not intended to replace advice given to you by your health care provider. Make sure you discuss any questions you have with your health care provider. Document Revised: 02/21/2017 Document Reviewed: 08/30/2015 Elsevier Patient Education  2020 [employer].   Hemorrhoids Hemorrhoids are swollen veins in and around the rectum or anus. There are two types of hemorrhoids:  Internal hemorrhoids. These occur in the veins that are just inside the rectum. They may poke through to the outside and become irritated and painful.  External hemorrhoids. These occur in the veins that are outside the anus and can be felt as a painful swelling or hard lump near the anus. Most hemorrhoids do not cause serious problems, and they can be managed with home treatments such as diet and lifestyle changes. If home treatments do not help the symptoms, procedures can be done to shrink or remove the hemorrhoids. What are the causes? This condition is caused by increased pressure in the anal area. This pressure may result from various things, including:  Constipation.  Straining to have a bowel movement.  Diarrhea.  Pregnancy.  Obesity.  Sitting for long periods of time.  Heavy lifting or other activity that causes you to strain.  Anal sex.  Riding a bike for a long period of time. What are the signs or symptoms? Symptoms of this condition include:  Pain.  Anal itching or irritation.  Rectal bleeding.  Leakage of stool (feces).  Anal swelling.  One or more lumps around  the anus. How is this diagnosed? This condition can often be diagnosed through a visual exam. Other exams or tests may also be done, such as:  An exam that involves feeling the rectal area with a gloved hand (digital rectal exam).  An exam of the anal canal that is done using a small tube (anoscope).  A blood test, if you have lost a significant amount of blood.  A test to look inside the colon  using a flexible tube with a camera on the end (sigmoidoscopy or colonoscopy). How is this treated? This condition can usually be treated at home. However, various procedures may be done if dietary changes, lifestyle changes, and other home treatments do not help your symptoms. These procedures can help make the hemorrhoids smaller or remove them completely. Some of these procedures involve surgery, and others do not. Common procedures include:  Rubber band ligation. Rubber bands are placed at the base of the hemorrhoids to cut off their blood supply.  Sclerotherapy. Medicine is injected into the hemorrhoids to shrink them.  Infrared coagulation. A type of light energy is used to get rid of the hemorrhoids.  Hemorrhoidectomy surgery. The hemorrhoids are surgically removed, and the veins that supply them are tied off.  Stapled hemorrhoidopexy surgery. The surgeon staples the base of the hemorrhoid to the rectal wall. Follow these instructions at home: Eating and drinking   Eat foods that have a lot of fiber in them, such as whole grains, beans, nuts, fruits, and vegetables.  Ask your health care provider about taking products that have added fiber (fiber supplements).  Reduce the amount of fat in your diet. You can do this by eating low-fat dairy products, eating less red meat, and avoiding processed foods.  Drink enough fluid to keep your urine pale yellow. Managing pain and swelling   Take warm sitz baths for 20 minutes, 3-4 times a day to ease pain and discomfort. You may do this in a bathtub or using a portable sitz bath that fits over the toilet.  If directed, apply ice to the affected area. Using ice packs between sitz baths may be helpful. ? Put ice in a plastic bag. ? Place a towel between your skin and the bag. ? Leave the ice on for 20 minutes, 2-3 times a day. General instructions  Take over-the-counter and prescription medicines only as told by your health care  provider.  Use medicated creams or suppositories as told.  Get regular exercise. Ask your health care provider how much and what kind of exercise is best for you. In general, you should do moderate exercise for at least 30 minutes on most days of the week (150 minutes each week). This can include activities such as walking, biking, or yoga.  Go to the bathroom when you have the urge to have a bowel movement. Do not wait.  Avoid straining to have bowel movements.  Keep the anal area dry and clean. Use wet toilet paper or moist towelettes after a bowel movement.  Do not sit on the toilet for long periods of time. This increases blood pooling and pain.  Keep all follow-up visits as told by your health care provider. This is important. Contact a health care provider if you have:  Increasing pain and swelling that are not controlled by treatment or medicine.  Difficulty having a bowel movement, or you are unable to have a bowel movement.  Pain or inflammation outside the area of the hemorrhoids. Get help right away  if you have:  Uncontrolled bleeding from your rectum. Summary  Hemorrhoids are swollen veins in and around the rectum or anus.  Most hemorrhoids can be managed with home treatments such as diet and lifestyle changes.  Taking warm sitz baths can help ease pain and discomfort.  In severe cases, procedures or surgery can be done to shrink or remove the hemorrhoids. This information is not intended to replace advice given to you by your health care provider. Make sure you discuss any questions you have with your health care provider. Document Revised: 08/07/2018 Document Reviewed: 07/31/2017 Elsevier Patient Education  [place].    How to Take a [employer] A sitz bath is a warm water bath that may be used to care for your rectum, genital area, or the area between your rectum and genitals (perineum). For a sitz bath, the water only comes up to your hips and covers  your buttocks. A sitz bath may done at home in a bathtub or with a portable sitz bath that fits over the toilet. Your health care provider may recommend a sitz bath to help:  Relieve pain and discomfort after delivering a baby.  Relieve pain and itching from hemorrhoids or anal fissures.  Relieve pain after certain surgeries.  Relax muscles that are sore or tight. How to take a sitz bath Take 3-4 sitz baths a day, or as many as told by your health care provider. Bathtub sitz bath To take a sitz bath in a bathtub: 1. Partially fill a bathtub with warm water. The water should be deep enough to cover your hips and buttocks when you are sitting in the tub. 2. If your health care provider told you to put medicine in the water, follow his or her instructions. 3. Sit in the water. 4. Open the tub drain a little, and leave it open during your bath. 5. Turn on the warm water again, enough to replace the water that is draining out. Keep the water running throughout your bath. This helps keep the water at the right level and the right temperature. 6. Soak in the water for 15-20 minutes, or as long as told by your health care provider. 7. When you are done, be careful when you stand up. You may feel dizzy. 8. After the sitz bath, pat yourself dry. Do not rub your skin to dry it.  Over-the-toilet sitz bath To take a sitz bath with an over-the-toilet basin: 1. Follow the manufacturer's instructions. 2. Fill the basin with warm water. 3. If your health care provider told you to put medicine in the water, follow his or her instructions. 4. Sit on the seat. Make sure the water covers your buttocks and perineum. 5. Soak in the water for 15-20 minutes, or as long as told by your health care provider. 6. After the sitz bath, pat yourself dry. Do not rub your skin to dry it. 7. Clean and dry the basin between uses. 8. Discard the basin if it cracks, or according to the manufacturer's  instructions. Contact a health care provider if:  Your symptoms get worse. Do not continue with sitz baths if your symptoms get worse.  You have new symptoms. If this happens, do not continue with sitz baths until you talk with your health care provider. Summary  A sitz bath is a warm water bath in which the water only comes up to your hips and covers your buttocks.  A sitz bath may help relieve itching, relieve pain,  and relax muscles that are sore or tight in the lower part of your body, including your genital area.  Take 3-4 sitz baths a day, or as many as told by your health care provider. Soak in the water for 15-20 minutes.  Do not continue with sitz baths if your symptoms get worse. This information is not intended to replace advice given to you by your health care provider. Make sure you discuss any questions you have with your health care provider. Document Revised: 08/10/2018 Document Reviewed: 03/13/2017 Elsevier Patient Education  [place].

## 2019-12-30 NOTE — Progress Notes (Signed)
Chief Complaint  Patient presents with  . Hemorrhoids   F/u  1. Hemorrhoids since pregnancy with intermittent bleeding and itching and rectal region not closing after stool but normal tried tucks w/o relief and showering. Worse x 6 months but hemorrhoids taking longer to receed x 1-2 months  She denies bleeding now  Chronic constipation as kid would go Q2-3 days and now qd to bid at times straining  2. Hypothyroidism reviewed SE could be constipation and call endocrine for f/u on levo 37.5 mcg and TSH >9 11/11/19   Review of Systems  Constitutional: Negative for weight loss.  HENT: Negative for hearing loss.   Respiratory: Negative for shortness of breath.   Cardiovascular: Negative for chest pain.  Gastrointestinal: Positive for constipation.       Hemorrhoids +  Skin: Negative for rash.   Past Medical History:  Diagnosis Date  . Allergy   . CML (chronic myelocytic leukemia) ([place])   . Fibroids   . Heart murmur   . Thyroid disease    h/o thyroid d/o not on meds   . Uterine fibroid   . UTI (urinary tract infection)    x2   . 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 CESAREAN SECTION     x 1 2003   . CYSTECTOMY     middle finger right hand   . OTHER SURGICAL HISTORY     cyst removed from finger  . TONSILECTOMY, ADENOIDECTOMY, BILATERAL MYRINGOTOMY AND TUBES    . TYMPANOSTOMY TUBE PLACEMENT     1978   Family History  Problem Relation Age of Onset  . Hyperthyroidism Mother   . Heart disease Father        CAD with stents   . Hyperlipidemia Father   . Hypertension Father   . Dementia Maternal Grandfather   . Melanoma Paternal Grandmother   . Bladder Cancer Paternal Grandmother   . Cancer Paternal Grandmother        lung/colon ?primary   . Arthritis Paternal Grandmother   . Bladder Cancer Paternal Grandfather   . Cancer Paternal Grandfather        bladder died in 64s   . Early death Paternal  Grandfather   . Alcohol abuse Maternal Grandmother   . Hepatitis Maternal Grandmother   . Early death Maternal Grandmother   . Kidney disease Maternal Grandmother   . Macular degeneration Maternal Grandmother   . Liver disease Maternal Grandmother        ?alcoholic hepatitis   . Hyperthyroidism Sister   . Asthma Brother   . Asthma Brother   . Macular degeneration Maternal Aunt    Social History   Socioeconomic History  . Marital status: Married    Spouse name: Not on file  . Number of children: Not on file  . Years of education: Not on file  . Highest education level: Not on file  Occupational History  . Not on file  Tobacco Use  . Smoking status: Never Smoker  . Smokeless tobacco: Never Used  Vaping Use  . Vaping Use: Never used  Substance and Sexual Activity  . Alcohol use: No  . Drug use: No  . Sexual activity: Yes    Birth control/protection: None  Other Topics Concern  . 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 Difficulty of Paying Living Expenses: Not on file  Food Insecurity:   . Worried About Charity fundraiser in the Last Year: Not on file  . Ran Out of Food in the Last Year: Not on file  Transportation Needs:   . Lack of Transportation (Medical): Not on file  . Lack of Transportation (Non-Medical): Not on file  Physical Activity:   . Days of Exercise per Week: Not on file  . Minutes of Exercise per Session: Not on file  Stress:   . Feeling of Stress : Not on file  Social Connections:   . Frequency of Communication with Friends and Family: Not on file  . Frequency of Social Gatherings with Friends and Family: Not on file  . Attends Religious  Services: Not on file  . Active Member of Clubs or Organizations: Not on file  . Attends Archivist Meetings: Not on file  . Marital Status: Not on file  Intimate Partner Violence:   . Fear of Current or Ex-Partner: Not on file  . Emotionally Abused: Not on file  . Physically Abused: Not on file  . Sexually Abused: Not on file   Current Meds  Medication Sig  . Cholecalciferol 1.25 MG (50000 UT) capsule Take 1 capsule (50,000 Units total) by mouth once a week.  . Cholecalciferol 1.25 MG (50000 UT) capsule Takes occassionally  . fexofenadine (ALLEGRA) 180 MG tablet Take 180 mg by mouth daily as needed for allergies or rhinitis.  Marland Kitchen levothyroxine (SYNTHROID) 25 MCG tablet Take 1.5 tablets (37.5 mcg total) by mouth daily before breakfast. Needs lab check further refills and endocrine f/u. DC 25 and 37.5 mcg dose  . nilotinib (TASIGNA) 150 MG capsule TAKE 2 CAPSULES (300MG) BY  MOUTH TWICE DAILY (12 HOURS APART) ON AN EMPTY STOMACH  1 HOUR BEFORE OR 2 HOURS  AFTER A MEAL  . triamcinolone cream (KENALOG) 0.1 % Apply 1 application topically 2 (two) times daily as needed. Hand eczema   Allergies  Allergen Reactions  . Ibuprofen   . Levoxyl [Levothyroxine Sodium]     Rash    . Synthroid [Levothyroxine]     rash  . Aspirin Rash  . Avocado Rash  . Eggs Or Egg-Derived Products Rash  . Penicillins Rash   Recent Results (from the past 2160 hour(s))  VITAMIN D 25 Hydroxy (Vit-D Deficiency, Fractures)     Status: Abnormal   Collection Time: 11/11/19  9:33 AM  Result Value Ref Range   Vit D, 25-Hydroxy 25.33 (L) 30 - 100 ng/mL    Comment: (NOTE) Vitamin D deficiency has been defined by the [facility] practice guideline as a level of serum 25-OH  vitamin D less than 20 ng/mL (1,2). The Endocrine Society went on to  further define vitamin D insufficiency as a level between 21 and 29  ng/mL (2).  1. IOM (Institute of Medicine). 2010. Dietary  reference intakes for  calcium and D. [place]: The [employer]. 2. Holick MF, Binkley [place], Bischoff-Ferrari HA, et al. Evaluation,  treatment, and prevention of vitamin D deficiency: an Endocrine  Society clinical practice guideline, JCEM. 2011 Jul; 96(7): 1911-30.  Performed at [facility], [place] [address], [place], [place] [address]   TSH     Status: Abnormal   Collection Time: 11/11/19  9:33 AM  Result Value Ref Range   TSH 9.864 (H) 0.350 - 4.500 uIU/mL    Comment: Performed by a 3rd Generation assay with a functional sensitivity of <=0.01 uIU/mL. Performed at [facility], [address] [place], [place] [address]   Lipid panel     Status: Abnormal   Collection Time: 11/11/19  9:33 AM  Result Value Ref Range   Cholesterol 255 (H) 0 - 200 mg/dL   Triglycerides 61 <150 mg/dL   HDL 90 >40 mg/dL   Total CHOL/HDL Ratio 2.8 RATIO   VLDL 12 0 - 40 mg/dL   LDL Cholesterol 153 (H) 0 - 99 mg/dL    Comment:        Total Cholesterol/HDL:CHD Risk Coronary Heart Disease Risk Table                     Men   Women  1/2 Average Risk   3.4   3.3  Average Risk       5.0   4.4  2 X Average Risk   9.6   7.1  3 X Average Risk  23.4   11.0        Use the calculated Patient Ratio above and the CHD Risk Table to determine the patient's CHD Risk.        ATP III CLASSIFICATION (LDL):  <100     mg/dL   Optimal  100-129  mg/dL   Near or Above                    Optimal  130-159  mg/dL   Borderline  160-189  mg/dL   High  >190     mg/dL   Very High Performed at [facility], [address] [place], [place] [address]   Comprehensive metabolic panel     Status: Abnormal   Collection Time: 11/11/19  9:33 AM  Result Value Ref Range   Sodium 138 135 - 145 mmol/L   Potassium 4.1 3.5 - 5.1 mmol/L   Chloride 102 98 - 111 mmol/L   CO2 28 22 - 32 mmol/L   Glucose, Bld 108 (H) 70 - 99 mg/dL    Comment: Glucose reference range applies only to samples  taken after fasting for at least 8 hours.   BUN 14 6 - 20 mg/dL   Creatinine, Ser 0.65 0.44 - 1.00 mg/dL   Calcium 9.1 8.9 - 10.3 mg/dL   Total Protein 8.6 (H) 6.5 - 8.1 g/dL   Albumin 4.3 3.5 - 5.0 g/dL   AST 19 15 - 41 U/L   ALT 18 0 - 44 U/L   Alkaline Phosphatase 51 38 - 126 U/L   Total Bilirubin 0.9 0.3 - 1.2 mg/dL   GFR calc non Af Amer >60 >60 mL/min   GFR calc Af Amer >60 >60 mL/min   Anion gap 8 5 - 15    Comment: Performed at [facility], [address] [place], [place] [address]  CBC with Differential/Platelet     Status: None   Collection Time: 11/11/19  9:33 AM  Result Value Ref Range   WBC 4.8 4.0 - 10.5 K/uL   RBC 4.12 3.87 - 5.11 MIL/uL   Hemoglobin 13.3 12.0 - 15.0 g/dL   HCT 37.3 36 - 46 %   MCV  90.5 80.0 - 100.0 fL   MCH 32.3 26.0 - 34.0 pg   MCHC 35.7 30.0 - 36.0 g/dL   RDW 11.7 11.5 - 15.5 %   Platelets 228 150 - 400 K/uL   nRBC 0.0 0.0 - 0.2 %   Neutrophils Relative % 48 %   Neutro Abs 2.3 1.7 - 7.7 K/uL   Lymphocytes Relative 37 %   Lymphs Abs 1.8 0.7 - 4.0 K/uL   Monocytes Relative 7 %   Monocytes Absolute 0.3 0.1 - 1.0 K/uL   Eosinophils Relative 7 %   Eosinophils Absolute 0.4 0 - 0 K/uL   Basophils Relative 1 %   Basophils Absolute 0.0 0 - 0 K/uL   Immature Granulocytes 0 %   Abs Immature Granulocytes 0.01 0.00 - 0.07 K/uL    Comment: Performed at [facility], [address] [place], [place] [address]  BCR-ABL1, CML/ALL, PCR, QUANT     Status: None   Collection Time: 11/11/19  9:33 AM  Result Value Ref Range   b2a2 transcript Comment %    Comment:            <0.0032 %   b3a2 transcript Comment %    Comment:            <0.0032 %   E1A2 Transcript Comment %    Comment:            <0.0032 %   Interpretation (BCRAL): Negative     Comment: (NOTE) NEGATIVE for the BCR-ABL1 e1a2 (p190), e13a2 (b2a2, p210) and e14a2 (b3a2, p210) fusion transcripts. These results do not rule out the presence of rare BCR-ABL1 transcripts not  detected by this assay.    Director Review [facility]): Comment     Comment: (NOTE) Constance Goltz, PhD, [facility]               Director, [place] for [place], [place]    Background: Comment     Comment: (NOTE) This assay can detect three different types of BCR-ABL1 fusion transcripts associated with CML, ALL, and AML: e13a2 (previously b2a2) and e14a2 (previously b3a2) (major breakpoint, p210), as well as e1a2 (minor breakpoint, p190). The e13a2 and e14a2 transcript values are titrated to the current International Scale (IS). The standardized baseline is 100% BCR-ABL1 (IS) and major molecular response (MMR) is equivalent to 0.1% BCR-ABL1 (IS) corresponding to a 3-log reduction. Results should be correlated with appropriate clinical and laboratory information as indicated.    Methodology Comment     Comment: (NOTE) Total RNA is isolated from the sample and subject to a real-time, reverse transcriptase polymerase chain reaction (RT-PCR). The PCR primers and probes are specific for BCR-ABL1 e13a2, e14a2 and e1a2 fusion transcripts. The ABL1 transcript is amplified as the control for cDNA quantity and quality. Serial dilutions of a validated positive control RNA with known t(9;22) BCR-ABL1 are used as reference for quantification of BCR-ABL1 relative to ABL1. The numeric BCR-ABL1 level is reported as % BCR-ABL1/ABL1 and the detection sensitivity is 4.5 log below the standard baseline (<0.0032%). This test was developed and its performance characteristics determined by [facility]. It has  not been cleared or approved by the Food and Drug Administration. Performed At: [facility] [address] [place], [place] [id number] Katina Degree MDPhD [id number] Performed At: [facility] [address] [place], [place] [id number] Katina Degree MDPhD  [id number]    Objective  Body mass index is 32.96 kg/m. Wt Readings from Last 3 Encounters:  12/30/19 192 lb (87.1 kg)  12/09/19 190 lb 8 oz (86.4 kg)  11/11/19 193 lb (87.5 kg)   Temp Readings from Last 3 Encounters:  12/30/19 99.1 F (37.3 C) (Oral)  11/11/19 99.5 F (37.5 C) (Tympanic)  10/29/19 99 F (37.2 C)   BP Readings from Last 3 Encounters:  12/30/19 132/84  12/09/19 120/80  11/11/19 122/86   Pulse Readings from Last 3 Encounters:  12/30/19 88  12/09/19 82  11/11/19 79    Physical Exam Vitals and nursing note reviewed.  Constitutional:      Appearance: Normal appearance. She is well-developed and well-groomed. She is obese.  HENT:     Head: Normocephalic and atraumatic.  Eyes:     Conjunctiva/sclera: Conjunctivae normal.     Pupils: Pupils are equal, round, and reactive to light.  Cardiovascular:     Rate and Rhythm: Normal rate and regular rhythm.     Heart sounds: Normal heart sounds. No murmur heard.   Pulmonary:     Effort: Pulmonary effort is normal.     Breath sounds: Normal breath sounds.  Genitourinary:    Rectum: External hemorrhoid present. No tenderness or anal fissure.  Skin:    General: Skin is warm and dry.  Neurological:     General: No focal deficit present.     Mental Status: She is alert and oriented to person, place, and time. Mental status is at baseline.     Gait: Gait normal.  Psychiatric:        Attention and Perception: Attention and perception normal.        Mood and Affect: Mood and affect normal.        Speech: Speech normal.        Behavior: Behavior normal. Behavior is cooperative.        Thought Content: Thought content normal.        Cognition and Memory: Cognition and memory normal.        Judgment: Judgment normal.     Assessment  Plan  Hemorrhoids, unspecified hemorrhoid type - Plan: hydrocortisone (ANUSOL-HC) 2.5 % rectal cream, Ambulatory referral to Gastroenterology [facility] in [place]  Hypothyroidism,  unspecified type On levo 37.5 mcg qd f/u with Dr. Loanne Drilling  Colon cancer screening - Plan: Ambulatory referral to Gastroenterology   HM Declines vaccines Pfizer 2/2 rec booster 6-8 months  Will need to do pap in future and consider US/TVUS with ob/gyn for DUB/fibroids -referred Dr. Silver Huguenin has not seen as of 10/29/19 and she wants to waitand will let me know  Mammogram-discussed and stressed impt of doing again todaygiven # to call and scheduleas of 04/27/19 pt wants to wait due to c/w covid 19  Call to schedule mammogram  We need to address HM I.e pap, mammo when she is ready and stressed again today  Colonoscopy rec do once COVID resolved but pt wants cologuard afraid of anesthesia will check as of 10/29/19 cost   Skin -consider derm in future  rec healthy diet and exercise    Obesity (BMI 30-39.9)  rec healthy diet and exercise   Provider: Dr. Olivia Mackie McLean-Scocuzza-Internal Medicine

## 2020-01-04 ENCOUNTER — Ambulatory Visit: Payer: BC Managed Care – PPO | Admitting: Cardiovascular Disease

## 2020-01-11 ENCOUNTER — Ambulatory Visit: Payer: BC Managed Care – PPO | Admitting: Cardiovascular Disease

## 2020-01-25 ENCOUNTER — Other Ambulatory Visit: Payer: Self-pay | Admitting: Oncology

## 2020-01-25 DIAGNOSIS — C9210 Chronic myeloid leukemia, BCR/ABL-positive, not having achieved remission: Secondary | ICD-10-CM

## 2020-02-09 ENCOUNTER — Inpatient Hospital Stay: Payer: BC Managed Care – PPO | Admitting: Oncology

## 2020-02-09 ENCOUNTER — Other Ambulatory Visit: Payer: Self-pay

## 2020-02-09 ENCOUNTER — Ambulatory Visit
Admission: RE | Admit: 2020-02-09 | Discharge: 2020-02-09 | Disposition: A | Discharge status: Home / Self Care | Payer: BC Managed Care – PPO | Source: Ambulatory Visit | Attending: Internal Medicine | Admitting: Internal Medicine

## 2020-02-09 ENCOUNTER — Inpatient Hospital Stay: Payer: BC Managed Care – PPO | Attending: Oncology

## 2020-02-09 ENCOUNTER — Encounter: Payer: Self-pay | Admitting: Oncology

## 2020-02-09 VITALS — BP 124/78 | HR 72 | Temp 98.6°F | Resp 18 | Wt 190.2 lb

## 2020-02-09 DIAGNOSIS — D509 Iron deficiency anemia, unspecified: Secondary | ICD-10-CM | POA: Insufficient documentation

## 2020-02-09 DIAGNOSIS — C9210 Chronic myeloid leukemia, BCR/ABL-positive, not having achieved remission: Secondary | ICD-10-CM | POA: Diagnosis not present

## 2020-02-09 DIAGNOSIS — Z1231 Encounter for screening mammogram for malignant neoplasm of breast: Secondary | ICD-10-CM | POA: Insufficient documentation

## 2020-02-09 DIAGNOSIS — Z136 Encounter for screening for cardiovascular disorders: Secondary | ICD-10-CM | POA: Diagnosis not present

## 2020-02-09 DIAGNOSIS — M2550 Pain in unspecified joint: Secondary | ICD-10-CM | POA: Insufficient documentation

## 2020-02-09 DIAGNOSIS — E039 Hypothyroidism, unspecified: Secondary | ICD-10-CM | POA: Insufficient documentation

## 2020-02-09 DIAGNOSIS — G8929 Other chronic pain: Secondary | ICD-10-CM | POA: Diagnosis not present

## 2020-02-09 LAB — CBC WITH DIFFERENTIAL/PLATELET
Abs Immature Granulocytes: 0.01 10*3/uL (ref 0.00–0.07)
Basophils Absolute: 0.1 10*3/uL (ref 0.0–0.1)
Basophils Relative: 1 %
Eosinophils Absolute: 0.3 10*3/uL (ref 0.0–0.5)
Eosinophils Relative: 6 %
HCT: 39.4 % (ref 36.0–46.0)
Hemoglobin: 13.4 g/dL (ref 12.0–15.0)
Immature Granulocytes: 0 %
Lymphocytes Relative: 36 %
Lymphs Abs: 2 10*3/uL (ref 0.7–4.0)
MCH: 31.7 pg (ref 26.0–34.0)
MCHC: 34 g/dL (ref 30.0–36.0)
MCV: 93.1 fL (ref 80.0–100.0)
Monocytes Absolute: 0.5 10*3/uL (ref 0.1–1.0)
Monocytes Relative: 8 %
Neutro Abs: 2.7 10*3/uL (ref 1.7–7.7)
Neutrophils Relative %: 49 %
Platelets: 240 10*3/uL (ref 150–400)
RBC: 4.23 MIL/uL (ref 3.87–5.11)
RDW: 11.9 % (ref 11.5–15.5)
WBC: 5.6 10*3/uL (ref 4.0–10.5)
nRBC: 0 % (ref 0.0–0.2)

## 2020-02-09 LAB — COMPREHENSIVE METABOLIC PANEL
ALT: 18 U/L (ref 0–44)
AST: 20 U/L (ref 15–41)
Albumin: 4.2 g/dL (ref 3.5–5.0)
Alkaline Phosphatase: 47 U/L (ref 38–126)
Anion gap: 8 (ref 5–15)
BUN: 15 mg/dL (ref 6–20)
CO2: 28 mmol/L (ref 22–32)
Calcium: 9.4 mg/dL (ref 8.9–10.3)
Chloride: 101 mmol/L (ref 98–111)
Creatinine, Ser: 0.66 mg/dL (ref 0.44–1.00)
GFR, Estimated: >60 mL/min (ref >60)
Glucose, Bld: 104 mg/dL — ABNORMAL HIGH (ref 70–99)
Potassium: 4.2 mmol/L (ref 3.5–5.1)
Sodium: 137 mmol/L (ref 135–145)
Total Bilirubin: 0.6 mg/dL (ref 0.3–1.2)
Total Protein: 7.9 g/dL (ref 6.5–8.1)

## 2020-02-09 NOTE — Progress Notes (Signed)
Hematology/Oncology follow up note [facility] Telephone:(336) [phone] Fax:([phone]   Patient Care Team: McLean-Scocuzza, Nino Glow, MD as PCP - General (Internal Medicine)  REFERRING PROVIDER: Previous oncologist from [place] Dr.Hasan Rizvi CHIEF COMPLAINTS/PURPOSE OF CONSULTATION:  Management of CML  HISTORY OF PRESENTING ILLNESS:  Jennifer English is a  54 y.o.  female with PMH listed below who was referred to by her oncologist in [place]. [place] to [place] this summer. She has not had local pcp yet. Extensive medical records review was performed.  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 She had dyuria symptoms at that time and lab work up found extreme leukocytosis with WBC 173,500 and she was initially started on hydrea. After CML was confirmed, she was started on Tasigna 349m BID since then and has achieved remission. . Also had splenomegaly at presentation about 5-6 cm below LCM, resolved. . She reports being complaint on nilotinib however, she has had period of time when her medication runs out. Although she moved this summer, due to lot of stresses associated with relocation and new job, she waited until now see hematologist.  Denies any fever, chill, weight loss, bleeding events. She used to have EKG done every 3 months and faxed to oncologist. She has not had any EKG done after July this year. No PCP Also reports recently feeling urine has odor and also very mild burning sensation when passing urine. She requests to have urine tested.  Has hypothyroidism, not taking thyroid supplements.  History of iron deficiency due to heavy menstrual periods/uterine fibroids, taking iron supplements.  # 10/09/2012, bone marrow aspirate smears and clots: Markedly hypercellular marrow with prominent myeloid hyperplasia and increasing small hypolobated megakaryocytes. The overall findings are consistent with chronic myeloid leukemia, chronic  phase. # 03/22/2015 BCR ABL quantification PCR showed e13a2 (b2a2 p210) transcript <0.001%,e14a2 ( b3a2, p210)  transcript 0.023%, e1a2 (p190) transcript <0.001% # 08/17/2015 BCR ABL quantification PCR showed e13a2 (b2a2 p210) transcript 0.011%,e14a2 ( b3a2, p210)  transcript <0.001%, e1a2 (p190) transcript <0.001%  # 04/25/2016 BCR ABL quantification PCR showed e13a2 (b2a2 p210) transcript <0.001%,e14a2 ( b3a2, p210)  transcript <0.001%, e1a2 (p190) transcript <0.001%  #09/17/2016  BCR ABL quantification PCR showed e13a2 (b2a2 p210) transcript <0.0032%,e14a2 ( b3a2, p210)  transcript <0.0032%, e1a2 (p190) transcript <0.0032%   02/19/2017 BCR ABL quantification PCR showed b2a2 transcript <0.0032%, b3a2 transcript <0.0032%, E1A2 transcript <0.0032%  INTERVAL HISTORY Jennifer GENSONis a 54y.o. female who has above history reviewed by me today presents for follow up of CML.  Patient reports feeling well. Patient has no new complaints.  She has some chronic joint pain. During the interval patient was referred to cardiology And was seen by Dr. RVelva Harmanfor abnormal EKG.  In Dr. AReuel Boomoffice, her EKG was normal.  Not consistent with septal infarct.  Review of Systems  Constitutional: Negative for appetite change, chills, fatigue and fever.  HENT:   Negative for hearing loss and voice change.   Eyes: Negative for eye problems.  Respiratory: Negative for chest tightness and cough.   Cardiovascular: Negative for chest pain.  Gastrointestinal: Negative for abdominal distention, abdominal pain and blood in stool.  Endocrine: Negative for hot flashes.  Genitourinary: Negative for difficulty urinating and frequency.   Musculoskeletal: Negative for arthralgias.  Skin: Negative for itching and rash.  Neurological: Negative for extremity weakness.  Hematological: Negative for adenopathy.  Psychiatric/Behavioral: Negative for confusion.   MEDICAL HISTORY:  Past  Medical History:  Diagnosis Date  .  Allergy   . CML (chronic myelocytic leukemia) (HCC)    leukemia  . Fibroids   . Heart murmur   . Thyroid disease    h/o thyroid d/o not on meds   . Uterine fibroid   . UTI (urinary tract infection)    x2   . 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 CESAREAN SECTION     x 1 2003   . CYSTECTOMY     middle finger right hand   . OTHER SURGICAL HISTORY     cyst removed from finger  . TONSILECTOMY, ADENOIDECTOMY, BILATERAL MYRINGOTOMY AND TUBES    . TYMPANOSTOMY TUBE PLACEMENT     1978    SOCIAL HISTORY: Social History   Socioeconomic History  . Marital status: Married    Spouse name: Not on file  . Number of children: Not on file  . Years of education: Not on file  . Highest education level: Not on file  Occupational History  . Not on file  Tobacco Use  . Smoking status: Never Smoker  . Smokeless tobacco: Never Used  Vaping Use  . Vaping Use: Never used  Substance and Sexual Activity  . Alcohol use: No  . Drug use: No  . Sexual activity: Yes    Birth control/protection: None  Other Topics Concern  . 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 Difficulty of Paying Living Expenses: Not on file  Food Insecurity:   . Worried About Charity fundraiser in the Last Year: Not on file  . Ran Out of Food in the Last Year: Not on file  Transportation Needs:   . Lack of Transportation (Medical): Not on file  . Lack of Transportation (Non-Medical): Not on file  Physical Activity:   . Days of Exercise per Week: Not on file  . Minutes of Exercise per Session: Not on file  Stress:    . Feeling of Stress : Not on file  Social Connections:   . Frequency of Communication with Friends and Family: Not on file  . Frequency of Social Gatherings with Friends and Family: Not on file  . Attends Religious Services: Not on file  . Active Member of Clubs or Organizations: Not on file  . Attends Archivist Meetings: Not on file  . Marital Status: Not on file  Intimate Partner Violence:   . Fear of Current or Ex-Partner: Not on file  . Emotionally Abused: Not on file  . Physically Abused: Not on file  . Sexually Abused: Not on file    FAMILY HISTORY: Family History  Problem Relation Age of Onset  . Hyperthyroidism Mother   . Heart disease Father        CAD with stents   . Hyperlipidemia Father   . Hypertension Father   . Dementia Maternal Grandfather   . Melanoma Paternal Grandmother   . Bladder Cancer Paternal Grandmother   . Cancer Paternal Grandmother  lung/colon ?primary   . Arthritis Paternal Grandmother   . Bladder Cancer Paternal Grandfather   . Cancer Paternal Grandfather        bladder died in 78s   . Early death Paternal Grandfather   . Alcohol abuse Maternal Grandmother   . Hepatitis Maternal Grandmother   . Early death Maternal Grandmother   . Kidney disease Maternal Grandmother   . Macular degeneration Maternal Grandmother   . Liver disease Maternal Grandmother        ?alcoholic hepatitis   . Hyperthyroidism Sister   . Asthma Brother   . Asthma Brother   . Macular degeneration Maternal Aunt   . Breast cancer Neg Hx     ALLERGIES:  is allergic to ibuprofen, levoxyl [levothyroxine sodium], synthroid [levothyroxine], aspirin, avocado, eggs or egg-derived products, and penicillins.  MEDICATIONS:  Current Outpatient Medications  Medication Sig Dispense Refill  . Cholecalciferol 1.25 MG (50000 UT) capsule Take 1 capsule (50,000 Units total) by mouth once a week. (Patient taking differently: Take 50,000 Units by mouth once a week.  Patient reports not taking weekly due to GI upset) 13 capsule 1  . Cholecalciferol 1.25 MG (50000 UT) capsule Takes occassionally    . fexofenadine (ALLEGRA) 180 MG tablet Take 180 mg by mouth daily as needed for allergies or rhinitis.    Marland Kitchen levothyroxine (SYNTHROID) 25 MCG tablet Take 1.5 tablets (37.5 mcg total) by mouth daily before breakfast. Needs lab check further refills and endocrine f/u. DC 25 and 37.5 mcg dose 140 tablet 3  . nilotinib (TASIGNA) 150 MG capsule TAKE 2 CAPSULES (300MG) BY  MOUTH TWICE DAILY (12 HOURS APART) ON AN EMPTY STOMACH  1 HOUR BEFORE OR 2 HOURS  AFTER A MEAL 112 capsule 1  . triamcinolone cream (KENALOG) 0.1 % Apply 1 application topically 2 (two) times daily as needed. Hand eczema 60 g 11  . hydrocortisone (ANUSOL-HC) 2.5 % rectal cream Place 1 application rectally 3 (three) times daily. Prn (Patient not taking: Reported on 02/09/2020) 30 g 11   No current facility-administered medications for this visit.     PHYSICAL EXAMINATION: ECOG PERFORMANCE STATUS: 0 - Asymptomatic Vitals:   02/09/20 1017  BP: 124/78  Pulse: 72  Resp: 18  Temp: 98.6 F (37 C)   Filed Weights   02/09/20 1017  Weight: 190 lb 3.2 oz (86.3 kg)   Physical Exam Constitutional:      General: She is not in acute distress.    Appearance: She is not diaphoretic.  HENT:     Head: Normocephalic and atraumatic.     Nose: Nose normal.     Mouth/Throat:     Pharynx: No oropharyngeal exudate.  Eyes:     General: No scleral icterus.    Pupils: Pupils are equal, round, and reactive to light.  Cardiovascular:     Rate and Rhythm: Normal rate and regular rhythm.     Heart sounds: No murmur heard.   Pulmonary:     Effort: Pulmonary effort is normal. No respiratory distress.     Breath sounds: No rales.  Chest:     Chest wall: No tenderness.  Abdominal:     General: There is no distension.     Palpations: Abdomen is soft.     Tenderness: There is no abdominal tenderness.   Musculoskeletal:        General: Normal range of motion.     Cervical back: Normal range of motion and neck supple.  Skin:  General: Skin is warm and dry.     Findings: No erythema.  Neurological:     Mental Status: She is alert and oriented to person, place, and time.     Cranial Nerves: No cranial nerve deficit.     Motor: No abnormal muscle tone.     Coordination: Coordination normal.  Psychiatric:        Mood and Affect: Affect normal.         LABORATORY DATA:  I have reviewed the data as listed Lab Results  Component Value Date   WBC 5.6 02/09/2020   HGB 13.4 02/09/2020   HCT 39.4 02/09/2020   MCV 93.1 02/09/2020   PLT 240 02/09/2020   Recent Labs    06/08/19 1309 06/08/19 1309 08/11/19 1250 11/11/19 0933 02/09/20 0943  NA 137   < > 139 138 137  K 3.6   < > 4.1 4.1 4.2  CL 101   < > 100 102 101  CO2 28   < > _0 GLUCOSE 104*   < > 92 108* 104*  BUN 10   < > _1 CREATININE 0.62   < > 0.78 0.65 0.66  CALCIUM 9.3   < > 9.3 9.1 9.4  GFRNONAA >60   < > >60 >60 >60  GFRAA >60  --  >60 >60  --   PROT 8.2*   < > 8.6* 8.6* 7.9  ALBUMIN 4.4   < > 4.5 4.3 4.2  AST 17   < > _2 ALT 19   < > _3 ALKPHOS 54   < > 56 51 47  BILITOT 1.6*   < > 1.1 0.9 0.6   < > = values in this interval not displayed.     ASSESSMENT & PLAN:  1. CML (chronic myelocytic leukemia) ([place])   #CML, in molecular remission EKG was performed and was reviewed by me Patient has normal QTc interval. Labs are reviewed and discussed with patient .  BCR ABL testing is pending. Previous testing showed major molecular remission Continue to [place] 300 mg twice daily  # Increased total protein level, reactive versus other etiologies. Multiple myeloma panel showed no M protein, polyclonal increase of immunoglobulins. Normal free light chian ration.  Likely reactive.  Continue to follow-up.   We spent sufficient time to discuss many aspect of care, questions were  answered to patient's satisfaction.  Return of visit: 3 months.  Check CBC, CMP, BCR ABL quantification.  [name], MD, PhD Hematology Oncology [facility] Pager- [phone] 02/09/2020

## 2020-02-09 NOTE — Progress Notes (Signed)
Patient denies new problems/concerns today.   °

## 2020-02-14 ENCOUNTER — Encounter: Payer: Self-pay | Admitting: Internal Medicine

## 2020-02-14 ENCOUNTER — Other Ambulatory Visit: Payer: Self-pay | Admitting: Internal Medicine

## 2020-02-14 DIAGNOSIS — N6310 Unspecified lump in the right breast, unspecified quadrant: Secondary | ICD-10-CM

## 2020-02-14 NOTE — Progress Notes (Signed)
rigt

## 2020-02-14 NOTE — Telephone Encounter (Signed)
-----   Message from Delorise Jackson, MD sent at 02/14/2020  6:00 AM EST ----- Mammogram with ? Right breast mass rec call facility to schedule diagnostic mammogram and [place]. This is for good measure further work up to make sure this area they see is ok

## 2020-02-18 LAB — BCR-ABL1, CML/ALL, PCR, QUANT: Interpretation (BCRAL):: NEGATIVE

## 2020-02-25 ENCOUNTER — Telehealth: Payer: Self-pay | Admitting: Pharmacy Technician

## 2020-02-25 NOTE — Telephone Encounter (Signed)
Oral Oncology Patient Advocate Encounter  Received notification from [employer] that the existing prior authorization for Tasigna is due for renewal.  Renewal PA submitted on CoverMyMeds Key [id number] Status is pending  Oral Oncology Clinic will continue to follow.  [place] Patient [place] Phone [phone] Fax [phone] 02/25/2020 3:30 PM

## 2020-02-25 NOTE — Telephone Encounter (Signed)
Oral Oncology Patient Advocate Encounter  Prior Authorization for Rae Lips has been approved.    PA# [id number]  Effective dates: 02/25/20 through 02/24/21  Patient fills through [facility].  Oral Oncology Clinic will continue to follow.   [place] Patient [place] Phone [phone] Fax [phone] 02/25/2020 3:31 PM

## 2020-03-01 ENCOUNTER — Ambulatory Visit
Admission: RE | Admit: 2020-03-01 | Discharge: 2020-03-01 | Disposition: A | Discharge status: Home / Self Care | Payer: BC Managed Care – PPO | Source: Ambulatory Visit | Attending: Internal Medicine | Admitting: Internal Medicine

## 2020-03-01 ENCOUNTER — Other Ambulatory Visit: Payer: Self-pay

## 2020-03-01 DIAGNOSIS — R922 Inconclusive mammogram: Secondary | ICD-10-CM | POA: Diagnosis not present

## 2020-03-01 DIAGNOSIS — N6489 Other specified disorders of breast: Secondary | ICD-10-CM | POA: Diagnosis not present

## 2020-03-01 DIAGNOSIS — N6310 Unspecified lump in the right breast, unspecified quadrant: Secondary | ICD-10-CM

## 2020-03-06 ENCOUNTER — Other Ambulatory Visit: Payer: Self-pay | Admitting: Internal Medicine

## 2020-03-06 DIAGNOSIS — N6310 Unspecified lump in the right breast, unspecified quadrant: Secondary | ICD-10-CM

## 2020-03-27 ENCOUNTER — Encounter: Payer: Self-pay | Admitting: Oncology

## 2020-03-28 ENCOUNTER — Other Ambulatory Visit: Payer: Self-pay

## 2020-03-28 DIAGNOSIS — C9210 Chronic myeloid leukemia, BCR/ABL-positive, not having achieved remission: Secondary | ICD-10-CM

## 2020-03-28 MED ORDER — NILOTINIB HCL 150 MG PO CAPS: ORAL_CAPSULE | ORAL | 1 refills | Status: DC

## 2020-04-14 ENCOUNTER — Other Ambulatory Visit: Payer: Self-pay | Admitting: Internal Medicine

## 2020-04-14 ENCOUNTER — Encounter: Payer: Self-pay | Admitting: Internal Medicine

## 2020-04-14 DIAGNOSIS — Z20822 Contact with and (suspected) exposure to covid-19: Secondary | ICD-10-CM

## 2020-05-03 ENCOUNTER — Ambulatory Visit: Payer: BC Managed Care – PPO | Admitting: Internal Medicine

## 2020-05-03 ENCOUNTER — Telehealth: Payer: Self-pay | Admitting: Internal Medicine

## 2020-05-03 NOTE — Telephone Encounter (Signed)
Patient no-showed today's appointment; appointment was for 05/03/20 at 9:00 am, provider notified for review of record.  Letter sent for patient to call in and re-schedule.

## 2020-05-11 ENCOUNTER — Inpatient Hospital Stay: Payer: BC Managed Care – PPO

## 2020-05-11 ENCOUNTER — Inpatient Hospital Stay: Payer: BC Managed Care – PPO | Admitting: Oncology

## 2020-05-17 ENCOUNTER — Inpatient Hospital Stay: Payer: BC Managed Care – PPO | Attending: Oncology

## 2020-05-17 ENCOUNTER — Encounter: Payer: Self-pay | Admitting: Oncology

## 2020-05-17 ENCOUNTER — Other Ambulatory Visit: Payer: Self-pay

## 2020-05-17 ENCOUNTER — Inpatient Hospital Stay (HOSPITAL_BASED_OUTPATIENT_CLINIC_OR_DEPARTMENT_OTHER): Payer: BC Managed Care – PPO | Admitting: Oncology

## 2020-05-17 VITALS — BP 116/80 | HR 78 | Temp 99.0°F | Resp 18 | Wt 191.6 lb

## 2020-05-17 DIAGNOSIS — C9210 Chronic myeloid leukemia, BCR/ABL-positive, not having achieved remission: Secondary | ICD-10-CM | POA: Diagnosis not present

## 2020-05-17 DIAGNOSIS — K649 Unspecified hemorrhoids: Secondary | ICD-10-CM

## 2020-05-17 DIAGNOSIS — E039 Hypothyroidism, unspecified: Secondary | ICD-10-CM | POA: Diagnosis not present

## 2020-05-17 LAB — CBC WITH DIFFERENTIAL/PLATELET
Abs Immature Granulocytes: 0.02 10*3/uL (ref 0.00–0.07)
Basophils Absolute: 0 10*3/uL (ref 0.0–0.1)
Basophils Relative: 1 %
Eosinophils Absolute: 0.3 10*3/uL (ref 0.0–0.5)
Eosinophils Relative: 6 %
HCT: 39.9 % (ref 36.0–46.0)
Hemoglobin: 13.7 g/dL (ref 12.0–15.0)
Immature Granulocytes: 0 %
Lymphocytes Relative: 29 %
Lymphs Abs: 1.5 10*3/uL (ref 0.7–4.0)
MCH: 31.8 pg (ref 26.0–34.0)
MCHC: 34.3 g/dL (ref 30.0–36.0)
MCV: 92.6 fL (ref 80.0–100.0)
Monocytes Absolute: 0.3 10*3/uL (ref 0.1–1.0)
Monocytes Relative: 6 %
Neutro Abs: 3.1 10*3/uL (ref 1.7–7.7)
Neutrophils Relative %: 58 %
Platelets: 244 10*3/uL (ref 150–400)
RBC: 4.31 MIL/uL (ref 3.87–5.11)
RDW: 11.8 % (ref 11.5–15.5)
WBC: 5.3 10*3/uL (ref 4.0–10.5)
nRBC: 0 % (ref 0.0–0.2)

## 2020-05-17 LAB — COMPREHENSIVE METABOLIC PANEL
ALT: 17 U/L (ref 0–44)
AST: 17 U/L (ref 15–41)
Albumin: 4.3 g/dL (ref 3.5–5.0)
Alkaline Phosphatase: 51 U/L (ref 38–126)
Anion gap: 9 (ref 5–15)
BUN: 15 mg/dL (ref 6–20)
CO2: 26 mmol/L (ref 22–32)
Calcium: 9.3 mg/dL (ref 8.9–10.3)
Chloride: 101 mmol/L (ref 98–111)
Creatinine, Ser: 0.63 mg/dL (ref 0.44–1.00)
GFR, Estimated: >60 mL/min (ref >60)
Glucose, Bld: 100 mg/dL — ABNORMAL HIGH (ref 70–99)
Potassium: 4.1 mmol/L (ref 3.5–5.1)
Sodium: 136 mmol/L (ref 135–145)
Total Bilirubin: 0.9 mg/dL (ref 0.3–1.2)
Total Protein: 8.1 g/dL (ref 6.5–8.1)

## 2020-05-17 NOTE — Progress Notes (Signed)
Hematology/Oncology follow up note [facility] Telephone:(336) [phone] Fax:(336) [phone]   Patient Care Team: McLean-Scocuzza, Nino Glow, MD as PCP - General (Internal Medicine)  REFERRING PROVIDER: Previous oncologist from [place] Dr.Hasan Rizvi CHIEF COMPLAINTS/PURPOSE OF CONSULTATION:  Management of CML  HISTORY OF PRESENTING ILLNESS:  Jennifer English is a  55 y.o.  female with PMH listed below who was referred to by her oncologist in [place]. Patient relocated from [place] to [place] this summer. She has not had local pcp yet. Extensive medical records review was performed.  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 She had dyuria symptoms at that time and lab work up found extreme leukocytosis with WBC 173,500 and she was initially started on hydrea. After CML was confirmed, she was started on Tasigna 300mg  BID since then and has achieved remission. . Also had splenomegaly at presentation about 5-6 cm below LCM, resolved. . She reports being complaint on nilotinib however, she has had period of time when her medication runs out. Although she moved this summer, due to lot of stresses associated with relocation and new job, she waited until now see hematologist.  Denies any fever, chill, weight loss, bleeding events. She used to have EKG done every 3 months and faxed to oncologist. She has not had any EKG done after July this year. No PCP Also reports recently feeling urine has odor and also very mild burning sensation when passing urine. She requests to have urine tested.  Has hypothyroidism, not taking thyroid supplements.  History of iron deficiency due to heavy menstrual periods/uterine fibroids, taking iron supplements.  # 10/09/2012, bone marrow aspirate smears and clots: Markedly hypercellular marrow with prominent myeloid hyperplasia and increasing small hypolobated megakaryocytes. The overall findings are consistent with chronic myeloid leukemia, chronic  phase. # 03/22/2015 BCR ABL quantification PCR showed e13a2 (b2a2 p210) transcript <0.001%,e14a2 ( b3a2, p210)  transcript 0.023%, e1a2 (p190) transcript <0.001% # 08/17/2015 BCR ABL quantification PCR showed e13a2 (b2a2 p210) transcript 0.011%,e14a2 ( b3a2, p210)  transcript <0.001%, e1a2 (p190) transcript <0.001%  # 04/25/2016 BCR ABL quantification PCR showed e13a2 (b2a2 p210) transcript <0.001%,e14a2 ( b3a2, p210)  transcript <0.001%, e1a2 (p190) transcript <0.001%  #09/17/2016  BCR ABL quantification PCR showed e13a2 (b2a2 p210) transcript <0.0032%,e14a2 ( b3a2, p210)  transcript <0.0032%, e1a2 (p190) transcript <0.0032%   02/19/2017 BCR ABL quantification PCR showed b2a2 transcript <0.0032%, b3a2 transcript <0.0032%, E1A2 transcript <0.0032%  cardiology Dr.End was seen by Dr. Velva Harman for abnormal EKG.  In Dr. Tyrell Antonio office, her EKG was normal.  Not consistent with septal infarct.  INTERVAL HISTORY Jennifer English is a 55 y.o. female who has above history reviewed by me today presents for follow up of CML.  Patient reports feeling well. She reports hemorrhoids pain recently Otherwise no new complaints. Review of Systems  Constitutional: Negative for appetite change, chills, fatigue and fever.  HENT:   Negative for hearing loss and voice change.   Eyes: Negative for eye problems.  Respiratory: Negative for chest tightness and cough.   Cardiovascular: Negative for chest pain.  Gastrointestinal: Negative for abdominal distention, abdominal pain and blood in stool.  Endocrine: Negative for hot flashes.  Genitourinary: Negative for difficulty urinating and frequency.   Musculoskeletal: Negative for arthralgias.  Skin: Negative for itching and rash.  Neurological: Negative for extremity weakness.  Hematological: Negative for adenopathy.  Psychiatric/Behavioral: Negative for confusion.   MEDICAL HISTORY:  Past Medical History:  Diagnosis Date  . Allergy   .  CML (chronic myelocytic  leukemia) (HCC)    leukemia  . Fibroids   . Heart murmur   . Thyroid disease    h/o thyroid d/o not on meds   . Uterine fibroid   . UTI (urinary tract infection)    x2   . 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 CESAREAN SECTION     x 1 2003   . CYSTECTOMY     middle finger right hand   . OTHER SURGICAL HISTORY     cyst removed from finger  . TONSILECTOMY, ADENOIDECTOMY, BILATERAL MYRINGOTOMY AND TUBES    . TYMPANOSTOMY TUBE PLACEMENT     1978    SOCIAL HISTORY: Social History   Socioeconomic History  . Marital status: Married    Spouse name: Not on file  . Number of children: Not on file  . Years of education: Not on file  . Highest education level: Not on file  Occupational History  . Not on file  Tobacco Use  . Smoking status: Never Smoker  . Smokeless tobacco: Never Used  Vaping Use  . Vaping Use: Never used  Substance and Sexual Activity  . Alcohol use: No  . Drug use: No  . Sexual activity: Yes    Birth control/protection: None  Other Topics Concern  . 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 Hyperthyroidism Mother   . Heart disease Father        CAD with stents   . Hyperlipidemia Father   . Hypertension Father   . Dementia Maternal Grandfather   . Melanoma Paternal Grandmother   .  Bladder Cancer Paternal Grandmother   . Cancer Paternal Grandmother        lung/colon ?primary   . Arthritis Paternal Grandmother   . Bladder Cancer Paternal Grandfather   . Cancer Paternal Grandfather        bladder died in 74s   . Early death Paternal Grandfather   . Alcohol abuse Maternal Grandmother   . Hepatitis Maternal Grandmother   . Early death Maternal Grandmother   . Kidney disease Maternal Grandmother   . Macular degeneration Maternal Grandmother   . Liver disease Maternal Grandmother        ?alcoholic hepatitis   . Hyperthyroidism Sister   . Asthma Brother   . Asthma Brother   . Macular degeneration Maternal Aunt   . Breast cancer Neg Hx     ALLERGIES:  is allergic to ibuprofen, levoxyl [levothyroxine sodium], synthroid [levothyroxine], aspirin, avocado, eggs or egg-derived products, and penicillins.  MEDICATIONS:  Current Outpatient Medications  Medication Sig Dispense Refill  . Cholecalciferol 1.25 MG (50000 UT) capsule Take 1 capsule (50,000 Units total) by mouth once a week. (Patient taking  differently: Take 50,000 Units by mouth once a week. Patient reports not taking weekly due to GI upset) 13 capsule 1  . fexofenadine (ALLEGRA) 180 MG tablet Take 180 mg by mouth daily as needed for allergies or rhinitis.    Marland Kitchen levothyroxine (SYNTHROID) 25 MCG tablet Take 1.5 tablets (37.5 mcg total) by mouth daily before breakfast. Needs lab check further refills and endocrine f/u. DC 25 and 37.5 mcg dose 140 tablet 3  . nilotinib (TASIGNA) 150 MG capsule TAKE 2 CAPSULES (300MG ) BY  MOUTH TWICE DAILY (12 HOURS APART) ON AN EMPTY STOMACH  1 HOUR BEFORE OR 2 HOURS  AFTER A MEAL 112 capsule 1  . triamcinolone cream (KENALOG) 0.1 % Apply 1 application topically 2 (two) times daily as needed. Hand eczema 60 g 11  . Cholecalciferol 1.25 MG (50000 UT) capsule Takes occassionally (Patient not taking: Reported on 05/17/2020)    . hydrocortisone (ANUSOL-HC) 2.5 % rectal cream Place 1  application rectally 3 (three) times daily. Prn (Patient not taking: No sig reported) 30 g 11   No current facility-administered medications for this visit.     PHYSICAL EXAMINATION: ECOG PERFORMANCE STATUS: 0 - Asymptomatic Vitals:   05/17/20 1335  BP: 116/80  Pulse: 78  Resp: 18  Temp: 99 F (37.2 C)   Filed Weights   05/17/20 1335  Weight: 191 lb 9.6 oz (86.9 kg)   Physical Exam Constitutional:      General: She is not in acute distress.    Appearance: She is not diaphoretic.  HENT:     Head: Normocephalic and atraumatic.     Nose: Nose normal.     Mouth/Throat:     Pharynx: No oropharyngeal exudate.  Eyes:     General: No scleral icterus.    Pupils: Pupils are equal, round, and reactive to light.  Cardiovascular:     Rate and Rhythm: Normal rate and regular rhythm.     Heart sounds: No murmur heard.   Pulmonary:     Effort: Pulmonary effort is normal. No respiratory distress.     Breath sounds: No rales.  Chest:     Chest wall: No tenderness.  Abdominal:     General: There is no distension.     Palpations: Abdomen is soft.     Tenderness: There is no abdominal tenderness.  Musculoskeletal:        General: Normal range of motion.     Cervical back: Normal range of motion and neck supple.  Skin:    General: Skin is warm and dry.     Findings: No erythema.  Neurological:     Mental Status: She is alert and oriented to person, place, and time.     Cranial Nerves: No cranial nerve deficit.     Motor: No abnormal muscle tone.     Coordination: Coordination normal.  Psychiatric:        Mood and Affect: Affect normal.    LABORATORY DATA:  I have reviewed the data as listed Lab Results  Component Value Date   WBC 5.3 05/17/2020   HGB 13.7 05/17/2020   HCT 39.9 05/17/2020   MCV 92.6 05/17/2020   PLT 244 05/17/2020   Recent Labs    06/08/19 1309 08/11/19 1250 11/11/19 0933 02/09/20 0943 05/17/20 1320  NA 137 139 138 137 136  K 3.6 4.1 4.1 4.2  4.1  CL 101 100 102 101 101  CO2 28 29 28 28 26   GLUCOSE 104* 92 108* 104* 100*  BUN 10 15 14 15 15   CREATININE 0.62 0.78 0.65 0.66 0.63  CALCIUM 9.3 9.3 9.1 9.4 9.3  GFRNONAA >60 >60 >60 >60 >60  GFRAA >60 >60 >60  --   --   PROT 8.2* 8.6* 8.6* 7.9 8.1  ALBUMIN 4.4 4.5 4.3 4.2 4.3  AST 17 19 19 20 17   ALT 19 18 18 18 17   ALKPHOS 54 56 51 47 51  BILITOT 1.6* 1.1 0.9 0.6 0.9     ASSESSMENT & PLAN:  1. CML (chronic myelocytic leukemia) ([place])   2. Hemorrhoids, unspecified hemorrhoid type   #CML, in molecular remission Labs reviewed and discussed with patient. Counts are stable. BCR ABL testing is pending. Previous testing showed major molecular remission Continue Tarsigna 300 mg twice daily Plan to repeat his EKG at the next visit.  #Hemorrhoid bleeding.  Patient is concerned about colonoscopy due to previous anesthesia adverse reactions.  I encourage patient to further discuss with primary care physician and talk to gastroenterology without concern.  Stool softener as needed for constipation.   We spent sufficient time to discuss many aspect of care, questions were answered to patient's satisfaction.  Return of visit: 3 months.  Check CBC, CMP, BCR ABL quantification.  [name], MD, PhD Hematology Oncology [facility] at [facility] Pager- [phone] 05/17/2020

## 2020-05-17 NOTE — Progress Notes (Signed)
Patient denies new problems/concerns today.   °

## 2020-05-28 LAB — BCR-ABL1, CML/ALL, PCR, QUANT: Interpretation (BCRAL):: NEGATIVE

## 2020-06-08 ENCOUNTER — Other Ambulatory Visit: Payer: Self-pay | Admitting: Oncology

## 2020-06-08 DIAGNOSIS — C9210 Chronic myeloid leukemia, BCR/ABL-positive, not having achieved remission: Secondary | ICD-10-CM

## 2020-06-18 ENCOUNTER — Other Ambulatory Visit: Payer: Self-pay | Admitting: Internal Medicine

## 2020-06-18 DIAGNOSIS — E039 Hypothyroidism, unspecified: Secondary | ICD-10-CM

## 2020-06-26 ENCOUNTER — Telehealth: Payer: Self-pay | Admitting: Internal Medicine

## 2020-06-26 NOTE — Telephone Encounter (Signed)
lft vm for pt to call ofc to sch diag mammo and [place]. thanks

## 2020-07-25 ENCOUNTER — Encounter: Payer: Self-pay | Admitting: Internal Medicine

## 2020-07-25 ENCOUNTER — Encounter: Payer: Self-pay | Admitting: Oncology

## 2020-07-26 ENCOUNTER — Telehealth: Payer: Self-pay | Admitting: Internal Medicine

## 2020-07-26 NOTE — Telephone Encounter (Signed)
See  Patient advise encounter

## 2020-07-26 NOTE — Telephone Encounter (Signed)
Does she want a virtual?

## 2020-07-27 NOTE — Telephone Encounter (Signed)
Do they need MAB or oral antiviral for treatment? I can send referral.   Faythe Casa, NP 07/27/2020 1:15 PM

## 2020-07-31 ENCOUNTER — Other Ambulatory Visit: Payer: Self-pay | Admitting: Nurse Practitioner

## 2020-07-31 DIAGNOSIS — U071 COVID-19: Secondary | ICD-10-CM

## 2020-08-11 ENCOUNTER — Telehealth: Payer: Self-pay | Admitting: Internal Medicine

## 2020-08-11 NOTE — Telephone Encounter (Signed)
lft vm for pt to call ofc to sch Diag mammo and US.thanks 

## 2020-08-16 ENCOUNTER — Inpatient Hospital Stay: Payer: BC Managed Care – PPO | Admitting: Oncology

## 2020-08-16 ENCOUNTER — Other Ambulatory Visit: Payer: Self-pay

## 2020-08-16 ENCOUNTER — Inpatient Hospital Stay: Payer: BC Managed Care – PPO | Attending: Oncology

## 2020-08-16 ENCOUNTER — Encounter: Payer: Self-pay | Admitting: Oncology

## 2020-08-16 VITALS — BP 114/82 | HR 78 | Temp 99.3°F | Resp 18 | Wt 191.8 lb

## 2020-08-16 DIAGNOSIS — E039 Hypothyroidism, unspecified: Secondary | ICD-10-CM | POA: Insufficient documentation

## 2020-08-16 DIAGNOSIS — C9211 Chronic myeloid leukemia, BCR/ABL-positive, in remission: Secondary | ICD-10-CM | POA: Diagnosis not present

## 2020-08-16 DIAGNOSIS — C9210 Chronic myeloid leukemia, BCR/ABL-positive, not having achieved remission: Secondary | ICD-10-CM

## 2020-08-16 LAB — CBC WITH DIFFERENTIAL/PLATELET
Abs Immature Granulocytes: 0.01 10*3/uL (ref 0.00–0.07)
Basophils Absolute: 0 10*3/uL (ref 0.0–0.1)
Basophils Relative: 1 %
Eosinophils Absolute: 0.3 10*3/uL (ref 0.0–0.5)
Eosinophils Relative: 6 %
HCT: 37.9 % (ref 36.0–46.0)
Hemoglobin: 12.9 g/dL (ref 12.0–15.0)
Immature Granulocytes: 0 %
Lymphocytes Relative: 33 %
Lymphs Abs: 1.6 10*3/uL (ref 0.7–4.0)
MCH: 31.5 pg (ref 26.0–34.0)
MCHC: 34 g/dL (ref 30.0–36.0)
MCV: 92.4 fL (ref 80.0–100.0)
Monocytes Absolute: 0.3 10*3/uL (ref 0.1–1.0)
Monocytes Relative: 7 %
Neutro Abs: 2.7 10*3/uL (ref 1.7–7.7)
Neutrophils Relative %: 53 %
Platelets: 211 10*3/uL (ref 150–400)
RBC: 4.1 MIL/uL (ref 3.87–5.11)
RDW: 11.9 % (ref 11.5–15.5)
WBC: 5 10*3/uL (ref 4.0–10.5)
nRBC: 0 % (ref 0.0–0.2)

## 2020-08-16 LAB — COMPREHENSIVE METABOLIC PANEL
ALT: 19 U/L (ref 0–44)
AST: 19 U/L (ref 15–41)
Albumin: 4.2 g/dL (ref 3.5–5.0)
Alkaline Phosphatase: 55 U/L (ref 38–126)
Anion gap: 10 (ref 5–15)
BUN: 13 mg/dL (ref 6–20)
CO2: 26 mmol/L (ref 22–32)
Calcium: 9.2 mg/dL (ref 8.9–10.3)
Chloride: 99 mmol/L (ref 98–111)
Creatinine, Ser: 0.68 mg/dL (ref 0.44–1.00)
GFR, Estimated: >60 mL/min (ref >60)
Glucose, Bld: 98 mg/dL (ref 70–99)
Potassium: 3.7 mmol/L (ref 3.5–5.1)
Sodium: 135 mmol/L (ref 135–145)
Total Bilirubin: 1.3 mg/dL — ABNORMAL HIGH (ref 0.3–1.2)
Total Protein: 8.1 g/dL (ref 6.5–8.1)

## 2020-08-16 MED ORDER — NILOTINIB HCL 150 MG PO CAPS: ORAL_CAPSULE | ORAL | 1 refills | Status: DC

## 2020-08-16 NOTE — Progress Notes (Signed)
Patient denies new problems/concerns today.   °

## 2020-08-16 NOTE — Progress Notes (Signed)
Hematology/Oncology follow up note [facility] [phone] Fax:([phone]   Patient Care Team: McLean-Scocuzza, Nino Glow, MD as PCP - General (Internal Medicine)  REFERRING PROVIDER: Previous oncologist from [place] Dr.Hasan Rizvi CHIEF COMPLAINTS/PURPOSE OF CONSULTATION:  Management of CML  HISTORY OF PRESENTING ILLNESS:  Jennifer English is a  55 y.o.  female with PMH listed below who was referred to by her oncologist in [place]. Patient relocated from [place] to [place] this summer. She has not had local pcp yet. Extensive medical records review was performed.  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 She had dyuria symptoms at that time and lab work up found extreme leukocytosis with WBC 173,500 and she was initially started on hydrea. After CML was confirmed, she was started on Tasigna 300mg  BID since then and has achieved remission. . Also had splenomegaly at presentation about 5-6 cm below LCM, resolved. . She reports being complaint on nilotinib however, she has had period of time when her medication runs out. Although she moved this summer, due to lot of stresses associated with relocation and new job, she waited until now see hematologist.  Denies any fever, chill, weight loss, bleeding events. She used to have EKG done every 3 months and faxed to oncologist. She has not had any EKG done after July this year. No PCP Also reports recently feeling urine has odor and also very mild burning sensation when passing urine. She requests to have urine tested.  Has hypothyroidism, not taking thyroid supplements.  History of iron deficiency due to heavy menstrual periods/uterine fibroids, taking iron supplements.  # 10/09/2012, bone marrow aspirate smears and clots: Markedly hypercellular marrow with prominent myeloid hyperplasia and increasing small hypolobated megakaryocytes. The overall findings are consistent with chronic myeloid leukemia, chronic  phase. # 03/22/2015 BCR ABL quantification PCR showed e13a2 (b2a2 p210) transcript <0.001%,e14a2 ( b3a2, p210)  transcript 0.023%, e1a2 (p190) transcript <0.001% # 08/17/2015 BCR ABL quantification PCR showed e13a2 (b2a2 p210) transcript 0.011%,e14a2 ( b3a2, p210)  transcript <0.001%, e1a2 (p190) transcript <0.001%  # 04/25/2016 BCR ABL quantification PCR showed e13a2 (b2a2 p210) transcript <0.001%,e14a2 ( b3a2, p210)  transcript <0.001%, e1a2 (p190) transcript <0.001%  #09/17/2016  BCR ABL quantification PCR showed e13a2 (b2a2 p210) transcript <0.0032%,e14a2 ( b3a2, p210)  transcript <0.0032%, e1a2 (p190) transcript <0.0032%   02/19/2017 BCR ABL quantification PCR showed b2a2 transcript <0.0032%, b3a2 transcript <0.0032%, E1A2 transcript <0.0032%  cardiology Dr.End was seen by Dr. Velva Harman for abnormal EKG.  In Dr. Tyrell Antonio office, her EKG was normal.  Not consistent with septal infarct.  INTERVAL HISTORY CHIANA WAMSER is a 55 y.o. female who has above history reviewed by me today presents for follow up of CML.  Patient reports feeling well. Early May 2022 patient has had COVID infection.  Symptoms are mild and she has recovered fully from the infection. No fever, chills, nausea vomiting diarrhea Otherwise no new complaints. Review of Systems  Constitutional: Negative for appetite change, chills, fatigue and fever.  HENT:   Negative for hearing loss and voice change.   Eyes: Negative for eye problems.  Respiratory: Negative for chest tightness and cough.   Cardiovascular: Negative for chest pain.  Gastrointestinal: Negative for abdominal distention, abdominal pain and blood in stool.  Endocrine: Negative for hot flashes.  Genitourinary: Negative for difficulty urinating and frequency.   Musculoskeletal: Negative for arthralgias.  Skin: Negative for itching and rash.  Neurological: Negative for extremity weakness.  Hematological: Negative for adenopathy.  Psychiatric/Behavioral: Negative for  confusion.   MEDICAL HISTORY:  Past Medical History:  Diagnosis Date  . Allergy   . CML (chronic myelocytic leukemia) (HCC)    leukemia  . Fibroids   . Heart murmur   . Thyroid disease    h/o thyroid d/o not on meds   . Uterine fibroid   . UTI (urinary tract infection)    x2   . 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 CESAREAN SECTION     x 1 2003   . CYSTECTOMY     middle finger right hand   . OTHER SURGICAL HISTORY     cyst removed from finger  . TONSILECTOMY, ADENOIDECTOMY, BILATERAL MYRINGOTOMY AND TUBES    . TYMPANOSTOMY TUBE PLACEMENT     1978    SOCIAL HISTORY: Social History   Socioeconomic History  . Marital status: Married    Spouse name: Not on file  . Number of children: Not on file  . Years of education: Not on file  . Highest education level: Not on file  Occupational History  . Not on file  Tobacco Use  . Smoking status: Never Smoker  . Smokeless tobacco: Never Used  Vaping Use  . Vaping Use: Never used  Substance and Sexual Activity  . Alcohol use: No  . Drug use: No  . Sexual activity: Yes    Birth control/protection: None  Other Topics Concern  . 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 Hyperthyroidism Mother   . Heart disease Father        CAD with stents   .  Hyperlipidemia Father   . Hypertension Father   . Dementia Maternal Grandfather   . Melanoma Paternal Grandmother   . Bladder Cancer Paternal Grandmother   . Cancer Paternal Grandmother        lung/colon ?primary   . Arthritis Paternal Grandmother   . Bladder Cancer Paternal Grandfather   . Cancer Paternal Grandfather        bladder died in 56s   . Early death Paternal Grandfather   . Alcohol abuse Maternal Grandmother   . Hepatitis Maternal Grandmother   . Early death Maternal Grandmother   . Kidney disease Maternal Grandmother   . Macular degeneration Maternal Grandmother   . Liver disease Maternal Grandmother        ?alcoholic hepatitis   . Hyperthyroidism Sister   . Asthma Brother   . Asthma Brother   . Macular degeneration Maternal Aunt   . Breast cancer Neg Hx     ALLERGIES:  is allergic to ibuprofen, levoxyl [levothyroxine sodium], synthroid [levothyroxine], aspirin, avocado, eggs or egg-derived products, and penicillins.  MEDICATIONS:  Current Outpatient Medications  Medication Sig Dispense Refill  .  fexofenadine (ALLEGRA) 180 MG tablet Take 180 mg by mouth daily as needed for allergies or rhinitis.    Marland Kitchen levothyroxine (SYNTHROID) 25 MCG tablet Take 1.5 tablets (37.5 mcg total) by mouth daily before breakfast. Needs lab check further refills and endocrine f/u. DC 25 and 37.5 mcg dose 140 tablet 3  . triamcinolone cream (KENALOG) 0.1 % Apply 1 application topically 2 (two) times daily as needed. Hand eczema 60 g 11  . vitamin C (ASCORBIC ACID) 500 MG tablet Take 500 mg by mouth daily.    . Cholecalciferol 1.25 MG (50000 UT) capsule Take 1 capsule (50,000 Units total) by mouth once a week. (Patient not taking: Reported on 08/16/2020) 13 capsule 1  . Cholecalciferol 1.25 MG (50000 UT) capsule Takes occassionally (Patient not taking: No sig reported)    . hydrocortisone (ANUSOL-HC) 2.5 % rectal cream Place 1 application rectally 3 (three) times daily. Prn (Patient not taking:  No sig reported) 30 g 11  . nilotinib (TASIGNA) 150 MG capsule TAKE 2 CAPSULES BY MOUTH  TWICE DAILY ON AN EMPTY  STOMACH 1 HOUR BEFORE OR 2  HOURS AFTER A MEAL 112 capsule 1   No current facility-administered medications for this visit.     PHYSICAL EXAMINATION: ECOG PERFORMANCE STATUS: 0 - Asymptomatic Vitals:   08/16/20 1325  BP: 114/82  Pulse: 78  Resp: 18  Temp: 99.3 F (37.4 C)   Filed Weights   08/16/20 1325  Weight: 191 lb 12.8 oz (87 kg)   Physical Exam Constitutional:      General: She is not in acute distress.    Appearance: She is not diaphoretic.  HENT:     Head: Normocephalic and atraumatic.     Nose: Nose normal.     Mouth/Throat:     Pharynx: No oropharyngeal exudate.  Eyes:     General: No scleral icterus.    Pupils: Pupils are equal, round, and reactive to light.  Cardiovascular:     Rate and Rhythm: Normal rate and regular rhythm.     Heart sounds: No murmur heard.   Pulmonary:     Effort: Pulmonary effort is normal. No respiratory distress.     Breath sounds: No rales.  Chest:     Chest wall: No tenderness.  Abdominal:     General: There is no distension.     Palpations: Abdomen is soft.     Tenderness: There is no abdominal tenderness.  Musculoskeletal:        General: Normal range of motion.     Cervical back: Normal range of motion and neck supple.  Skin:    General: Skin is warm and dry.     Findings: No erythema.  Neurological:     Mental Status: She is alert and oriented to person, place, and time.     Cranial Nerves: No cranial nerve deficit.     Motor: No abnormal muscle tone.     Coordination: Coordination normal.  Psychiatric:        Mood and Affect: Affect normal.    LABORATORY DATA:  I have reviewed the data as listed Lab Results  Component Value Date   WBC 5.0 08/16/2020   HGB 12.9 08/16/2020   HCT 37.9 08/16/2020   MCV 92.4 08/16/2020   PLT 211 08/16/2020   Recent Labs    11/11/19 0933 02/09/20 0943  05/17/20 1320 08/16/20 1245  NA 138 137 136 135  K 4.1 4.2 4.1 3.7  CL 102 101 101 99  CO2 28 28 26 26   GLUCOSE 108* 104* 100* 98  BUN 14 15 15 13   CREATININE 0.65 0.66 0.63 0.68  CALCIUM 9.1 9.4 9.3 9.2  GFRNONAA >60 >60 >60 >60  GFRAA >60  --   --   --   PROT 8.6* 7.9 8.1 8.1  ALBUMIN 4.3 4.2 4.3 4.2  AST 19 20 17 19   ALT 18 18 17 19   ALKPHOS 51 47 51 55  BILITOT 0.9 0.6 0.9 1.3*     ASSESSMENT & PLAN:  1. CML (chronic myelocytic leukemia) ([place])   2. Hyperbilirubinemia   #CML, in molecular remission Labs are reviewed and discussed with patient. Counts are normal and is stable. BCR ABL1 testing is pending. EKG was performed and reviewed by me.  Normal QTC.  We will recheck in 6 months. 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  Monitor.   We spent sufficient time to discuss many aspect of care, questions were answered to patient's satisfaction.  Return of visit: 3 months.  Check CBC, CMP, BCR ABL1 quantification.  [name], MD, PhD Hematology Oncology [facility] at [facility] Pager- [phone] 08/16/2020

## 2020-08-17 ENCOUNTER — Telehealth: Payer: Self-pay | Admitting: Internal Medicine

## 2020-08-17 NOTE — Telephone Encounter (Signed)
Lft vm for pt to call ofc to sch Diag mammo and [place]. thanks

## 2020-08-24 ENCOUNTER — Encounter: Payer: Self-pay | Admitting: Oncology

## 2020-08-26 ENCOUNTER — Encounter: Payer: Self-pay | Admitting: Oncology

## 2020-08-26 ENCOUNTER — Other Ambulatory Visit: Payer: Self-pay | Admitting: Oncology

## 2020-08-26 DIAGNOSIS — C9210 Chronic myeloid leukemia, BCR/ABL-positive, not having achieved remission: Secondary | ICD-10-CM

## 2020-08-26 LAB — BCR-ABL1, CML/ALL, PCR, QUANT
Interpretation (BCRAL):: POSITIVE
b2a2 transcript: 0.6694 %
b3a2 transcript: 11.1024 %

## 2020-08-28 ENCOUNTER — Telehealth: Payer: Self-pay

## 2020-08-28 NOTE — Telephone Encounter (Signed)
Patient notified of test results. Accepted lab appt for 6/7 @ 10am. Pt also asked if she should stop/ continue with medication (Tasigna). Informed her that there were not specific instructions for her to stop medication. She will continue to take until told not to.

## 2020-08-28 NOTE — Telephone Encounter (Signed)
-----   Message from [name], MD sent at 08/26/2020  2:16 PM EDT ----- Please let her know that most recent tests if positive for CML cells.  Recommend her to do some additional testings. Keep current appt, I will review tests and decide. Tests takes a few weeks to result

## 2020-08-29 ENCOUNTER — Other Ambulatory Visit: Payer: Self-pay

## 2020-08-29 ENCOUNTER — Inpatient Hospital Stay: Payer: BC Managed Care – PPO | Attending: Oncology

## 2020-08-29 DIAGNOSIS — C9211 Chronic myeloid leukemia, BCR/ABL-positive, in remission: Secondary | ICD-10-CM | POA: Diagnosis not present

## 2020-08-29 DIAGNOSIS — C9210 Chronic myeloid leukemia, BCR/ABL-positive, not having achieved remission: Secondary | ICD-10-CM

## 2020-08-29 LAB — COMPREHENSIVE METABOLIC PANEL
ALT: 13 U/L (ref 0–44)
AST: 17 U/L (ref 15–41)
Albumin: 4.1 g/dL (ref 3.5–5.0)
Alkaline Phosphatase: 51 U/L (ref 38–126)
Anion gap: 10 (ref 5–15)
BUN: 14 mg/dL (ref 6–20)
CO2: 26 mmol/L (ref 22–32)
Calcium: 9.1 mg/dL (ref 8.9–10.3)
Chloride: 100 mmol/L (ref 98–111)
Creatinine, Ser: 0.67 mg/dL (ref 0.44–1.00)
GFR, Estimated: >60 mL/min (ref >60)
Glucose, Bld: 141 mg/dL — ABNORMAL HIGH (ref 70–99)
Potassium: 4 mmol/L (ref 3.5–5.1)
Sodium: 136 mmol/L (ref 135–145)
Total Bilirubin: 1 mg/dL (ref 0.3–1.2)
Total Protein: 7.8 g/dL (ref 6.5–8.1)

## 2020-08-29 LAB — CBC WITH DIFFERENTIAL/PLATELET
Abs Immature Granulocytes: 0.01 10*3/uL (ref 0.00–0.07)
Basophils Absolute: 0 10*3/uL (ref 0.0–0.1)
Basophils Relative: 0 %
Eosinophils Absolute: 0.3 10*3/uL (ref 0.0–0.5)
Eosinophils Relative: 7 %
HCT: 38.8 % (ref 36.0–46.0)
Hemoglobin: 13.2 g/dL (ref 12.0–15.0)
Immature Granulocytes: 0 %
Lymphocytes Relative: 29 %
Lymphs Abs: 1.4 10*3/uL (ref 0.7–4.0)
MCH: 31.7 pg (ref 26.0–34.0)
MCHC: 34 g/dL (ref 30.0–36.0)
MCV: 93 fL (ref 80.0–100.0)
Monocytes Absolute: 0.3 10*3/uL (ref 0.1–1.0)
Monocytes Relative: 6 %
Neutro Abs: 2.8 10*3/uL (ref 1.7–7.7)
Neutrophils Relative %: 58 %
Platelets: 243 10*3/uL (ref 150–400)
RBC: 4.17 MIL/uL (ref 3.87–5.11)
RDW: 12.2 % (ref 11.5–15.5)
WBC: 4.8 10*3/uL (ref 4.0–10.5)
nRBC: 0 % (ref 0.0–0.2)

## 2020-09-01 LAB — BCR-ABL1 FISH
Cells Analyzed: 200
Cells Counted: 200

## 2020-09-01 LAB — MISC LABCORP TEST (SEND OUT): Labcorp test code: 480510

## 2020-09-05 ENCOUNTER — Other Ambulatory Visit: Payer: Self-pay

## 2020-09-05 DIAGNOSIS — C9210 Chronic myeloid leukemia, BCR/ABL-positive, not having achieved remission: Secondary | ICD-10-CM

## 2020-09-05 NOTE — Telephone Encounter (Signed)
Jennifer English will you move up appts as requested by MD please.

## 2020-10-03 ENCOUNTER — Inpatient Hospital Stay: Payer: BC Managed Care – PPO | Attending: Oncology

## 2020-10-03 ENCOUNTER — Other Ambulatory Visit: Payer: Self-pay

## 2020-10-03 DIAGNOSIS — C9210 Chronic myeloid leukemia, BCR/ABL-positive, not having achieved remission: Secondary | ICD-10-CM | POA: Diagnosis not present

## 2020-10-03 DIAGNOSIS — E039 Hypothyroidism, unspecified: Secondary | ICD-10-CM | POA: Diagnosis not present

## 2020-10-03 LAB — CBC WITH DIFFERENTIAL/PLATELET
Abs Immature Granulocytes: 0 10*3/uL (ref 0.00–0.07)
Basophils Absolute: 0.1 10*3/uL (ref 0.0–0.1)
Basophils Relative: 1 %
Eosinophils Absolute: 0.5 10*3/uL (ref 0.0–0.5)
Eosinophils Relative: 8 %
HCT: 38.9 % (ref 36.0–46.0)
Hemoglobin: 13.1 g/dL (ref 12.0–15.0)
Immature Granulocytes: 0 %
Lymphocytes Relative: 30 %
Lymphs Abs: 1.8 10*3/uL (ref 0.7–4.0)
MCH: 31.6 pg (ref 26.0–34.0)
MCHC: 33.7 g/dL (ref 30.0–36.0)
MCV: 93.7 fL (ref 80.0–100.0)
Monocytes Absolute: 0.3 10*3/uL (ref 0.1–1.0)
Monocytes Relative: 5 %
Neutro Abs: 3.4 10*3/uL (ref 1.7–7.7)
Neutrophils Relative %: 56 %
Platelets: 239 10*3/uL (ref 150–400)
RBC: 4.15 MIL/uL (ref 3.87–5.11)
RDW: 11.9 % (ref 11.5–15.5)
WBC: 6.1 10*3/uL (ref 4.0–10.5)
nRBC: 0 % (ref 0.0–0.2)

## 2020-10-03 LAB — COMPREHENSIVE METABOLIC PANEL
ALT: 17 U/L (ref 0–44)
AST: 18 U/L (ref 15–41)
Albumin: 4.3 g/dL (ref 3.5–5.0)
Alkaline Phosphatase: 55 U/L (ref 38–126)
Anion gap: 10 (ref 5–15)
BUN: 11 mg/dL (ref 6–20)
CO2: 26 mmol/L (ref 22–32)
Calcium: 9.1 mg/dL (ref 8.9–10.3)
Chloride: 100 mmol/L (ref 98–111)
Creatinine, Ser: 0.68 mg/dL (ref 0.44–1.00)
GFR, Estimated: >60 mL/min (ref >60)
Glucose, Bld: 106 mg/dL — ABNORMAL HIGH (ref 70–99)
Potassium: 3.5 mmol/L (ref 3.5–5.1)
Sodium: 136 mmol/L (ref 135–145)
Total Bilirubin: 1 mg/dL (ref 0.3–1.2)
Total Protein: 8 g/dL (ref 6.5–8.1)

## 2020-10-05 ENCOUNTER — Other Ambulatory Visit: Payer: Self-pay | Admitting: Oncology

## 2020-10-05 DIAGNOSIS — C9210 Chronic myeloid leukemia, BCR/ABL-positive, not having achieved remission: Secondary | ICD-10-CM

## 2020-10-06 LAB — BCR-ABL1 FISH
Cells Analyzed: 200
Cells Counted: 200

## 2020-10-13 LAB — BCR-ABL1, CML/ALL, PCR, QUANT: Interpretation (BCRAL):: NEGATIVE

## 2020-10-17 ENCOUNTER — Inpatient Hospital Stay: Payer: BC Managed Care – PPO | Admitting: Oncology

## 2020-10-17 ENCOUNTER — Encounter: Payer: Self-pay | Admitting: Oncology

## 2020-10-17 VITALS — BP 114/80 | HR 78 | Temp 98.0°F | Resp 18 | Wt 193.9 lb

## 2020-10-17 DIAGNOSIS — C9210 Chronic myeloid leukemia, BCR/ABL-positive, not having achieved remission: Secondary | ICD-10-CM | POA: Diagnosis not present

## 2020-10-17 DIAGNOSIS — E039 Hypothyroidism, unspecified: Secondary | ICD-10-CM | POA: Diagnosis not present

## 2020-10-17 DIAGNOSIS — Z5111 Encounter for antineoplastic chemotherapy: Secondary | ICD-10-CM

## 2020-10-17 NOTE — Progress Notes (Signed)
Pt here for follow up. No new concerns voiced.   

## 2020-10-17 NOTE — Progress Notes (Signed)
Hematology/Oncology follow up note [facility] Telephone:(336) [phone] Fax:(336) [phone]   Patient Care Team: McLean-Scocuzza, Nino Glow, MD as PCP - General (Internal Medicine)  REFERRING PROVIDER: Previous oncologist from [place] Dr.Hasan Rizvi CHIEF COMPLAINTS/PURPOSE OF VISIT:  Management of CML  HISTORY OF PRESENTING ILLNESS:  Jennifer English is a  55 y.o.  female with PMH listed below who was referred to by her oncologist in [place]. Patient relocated from [place] to [place] this summer. She has not had local pcp yet. Extensive medical records review was performed.  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 She had dyuria symptoms at that time and lab work up found extreme leukocytosis with WBC 173,500 and she was initially started on hydrea. After CML was confirmed, she was started on Tasigna '300mg'$  BID since then and has achieved remission. . Also had splenomegaly at presentation about 5-6 cm below LCM, resolved. . She reports being complaint on nilotinib however, she has had period of time when her medication runs out. Although she moved this summer, due to lot of stresses associated with relocation and new job, she waited until now see hematologist.  Denies any fever, chill, weight loss, bleeding events. She used to have EKG done every 3 months and faxed to oncologist. She has not had any EKG done after July this year. No PCP Also reports recently feeling urine has odor and also very mild burning sensation when passing urine. She requests to have urine tested.  Has hypothyroidism, not taking thyroid supplements.  History of iron deficiency due to heavy menstrual periods/uterine fibroids, taking iron supplements.  # 10/09/2012, bone marrow aspirate smears and clots: Markedly hypercellular marrow with prominent myeloid hyperplasia and increasing small hypolobated megakaryocytes. The overall findings are consistent with chronic myeloid leukemia, chronic phase. #  03/22/2015 BCR ABL quantification PCR showed e13a2 (b2a2 p210) transcript <0.001%,e14a2 ( b3a2, p210)  transcript 0.023%, e1a2 (p190) transcript <0.001% # 08/17/2015 BCR ABL quantification PCR showed e13a2 (b2a2 p210) transcript 0.011%,e14a2 ( b3a2, p210)  transcript <0.001%, e1a2 (p190) transcript <0.001%  # 04/25/2016 BCR ABL quantification PCR showed e13a2 (b2a2 p210) transcript <0.001%,e14a2 ( b3a2, p210)  transcript <0.001%, e1a2 (p190) transcript <0.001%  #09/17/2016  BCR ABL quantification PCR showed e13a2 (b2a2 p210) transcript <0.0032%,e14a2 ( b3a2, p210)  transcript <0.0032%, e1a2 (p190) transcript <0.0032%   02/19/2017 BCR ABL quantification PCR showed b2a2 transcript <0.0032%, b3a2 transcript <0.0032%, E1A2 transcript <0.0032%  cardiology Dr.End was seen by Dr. Velva Harman for abnormal EKG.  In Dr. Tyrell Antonio office, her EKG was normal.  Not consistent with septal infarct.  INTERVAL HISTORY Jennifer English is a 55 y.o. female who has above history reviewed by me today presents for follow up of CML.  Patient reports feeling well. Early May 2022 patient has had COVID infection.   08/29/2020, BCR ABL 1 FISH negative, BCR ABL 1 quantification PCR positive for BCR-ABL1 e13a2 (b2a2, p210) and e14a2 (b3a2, p210) IS [id number]. Patient was recommended to repeat testing which was done on 10/03/2020. 10/03/2020, BCR ABL 1 FISH negative, qualification PCR all negative. Patient reports being compliant on Tasigna    Review of Systems  Constitutional:  Negative for appetite change, chills, fatigue and fever.  HENT:   Negative for hearing loss and voice change.   Eyes:  Negative for eye problems.  Respiratory:  Negative for chest tightness and cough.   Cardiovascular:  Negative for chest pain.  Gastrointestinal:  Negative for abdominal distention, abdominal pain and blood in stool.  Endocrine: Negative for hot flashes.  Genitourinary:  Negative for difficulty urinating and frequency.   Musculoskeletal:   Negative for arthralgias.  Skin:  Negative for itching and rash.  Neurological:  Negative for extremity weakness.  Hematological:  Negative for adenopathy.  Psychiatric/Behavioral:  Negative for confusion.   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     SURGICAL HISTORY: Past Surgical History:  Procedure Laterality Date   CESAREAN SECTION     x 1 2003    CYSTECTOMY     middle finger right hand    OTHER SURGICAL HISTORY     cyst removed from finger   TONSILECTOMY, ADENOIDECTOMY, BILATERAL MYRINGOTOMY AND TUBES     TYMPANOSTOMY TUBE PLACEMENT     1978    SOCIAL HISTORY: Social History   Socioeconomic History   Marital status: Married    Spouse name: Not on file   Number of children: Not on file   Years of education: Not on file   Highest education level: Not on file  Occupational History   Not on file  Tobacco Use   Smoking status: Never   Smokeless tobacco: Never  Vaping Use   Vaping Use: Never used  Substance and Sexual Activity   Alcohol use: No   Drug use: No   Sexual activity: Yes    Birth control/protection: None  Other Topics Concern   Not on file  Social History Narrative   Married lives with daughter husband and mother in law   1 daughter    Works Engineer, 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   [place] from [place] 2018    Social Determinants of Health   Financial Resource Strain: Not on file  Food Insecurity: Not on file  Transportation Needs: Not on file  Physical Activity: Not on file  Stress: Not on file  Social Connections: Not on file  Intimate Partner Violence: Not on file     FAMILY HISTORY: Family History  Problem Relation Age of Onset   Hyperthyroidism Mother    Heart disease Father        CAD with stents    Hyperlipidemia Father    Hypertension Father    Dementia Maternal Grandfather    Melanoma Paternal Grandmother    Bladder Cancer Paternal Grandmother    Cancer Paternal Grandmother        lung/colon ?primary    Arthritis Paternal Grandmother    Bladder Cancer Paternal Grandfather    Cancer Paternal Grandfather        bladder died in 41s    Early death Paternal Grandfather    Alcohol abuse Maternal Grandmother    Hepatitis Maternal Grandmother    Early death Maternal Grandmother    Kidney disease Maternal Grandmother    Macular degeneration Maternal Grandmother    Liver disease Maternal Grandmother        ?alcoholic hepatitis    Hyperthyroidism Sister    Asthma Brother    Asthma Brother    Macular degeneration Maternal  Aunt    Breast cancer Neg Hx     ALLERGIES:  is allergic to ibuprofen, levoxyl [levothyroxine sodium], synthroid [levothyroxine], aspirin, avocado, eggs or egg-derived products, and penicillins.  MEDICATIONS:  Current Outpatient Medications  Medication Sig Dispense Refill   levothyroxine (SYNTHROID) 25 MCG tablet Take 1.5 tablets (37.5 mcg total) by mouth daily before breakfast. Needs lab check further refills and endocrine f/u. DC 25 and 37.5 mcg dose 140 tablet 3   nilotinib (TASIGNA) 150 MG capsule TAKE 2 CAPSULES ('300MG'$ ) BY  MOUTH TWICE DAILY (12 HOURS APART) ON AN EMPTY STOMACH  1 HOUR BEFORE OR 2 HOURS  AFTER A MEAL 112 capsule 1   vitamin C (ASCORBIC ACID) 500 MG tablet Take 500 mg by mouth daily.     fexofenadine (ALLEGRA) 180 MG tablet Take 180 mg by mouth daily as needed for allergies or rhinitis. (Patient not taking: Reported on 10/17/2020)     hydrocortisone (ANUSOL-HC) 2.5 % rectal cream Place 1 application rectally 3 (three) times daily. Prn (Patient not taking: No sig reported) 30 g 11   No current  facility-administered medications for this visit.     PHYSICAL EXAMINATION: ECOG PERFORMANCE STATUS: 0 - Asymptomatic Vitals:   10/17/20 1134  BP: 114/80  Pulse: 78  Resp: 18  Temp: 98 F (36.7 C)   Filed Weights   10/17/20 1134  Weight: 193 lb 14.4 oz (88 kg)   Physical Exam Constitutional:      General: She is not in acute distress.    Appearance: She is not diaphoretic.  HENT:     Head: Normocephalic and atraumatic.     Nose: Nose normal.     Mouth/Throat:     Pharynx: No oropharyngeal exudate.  Eyes:     General: No scleral icterus.    Pupils: Pupils are equal, round, and reactive to light.  Cardiovascular:     Rate and Rhythm: Normal rate and regular rhythm.     Heart sounds: No murmur heard. Pulmonary:     Effort: Pulmonary effort is normal. No respiratory distress.     Breath sounds: No rales.  Chest:     Chest wall: No tenderness.  Abdominal:     General: There is no distension.     Palpations: Abdomen is soft.     Tenderness: There is no abdominal tenderness.  Musculoskeletal:        General: Normal range of motion.     Cervical back: Normal range of motion and neck supple.  Skin:    General: Skin is warm and dry.     Findings: No erythema.  Neurological:     Mental Status: She is alert and oriented to person, place, and time.     Cranial Nerves: No cranial nerve deficit.     Motor: No abnormal muscle tone.     Coordination: Coordination normal.  Psychiatric:        Mood and Affect: Affect normal.   LABORATORY DATA:  I have reviewed the data as listed Lab Results  Component Value Date   WBC 6.1 10/03/2020   HGB 13.1 10/03/2020   HCT 38.9 10/03/2020   MCV 93.7 10/03/2020   PLT 239 10/03/2020   Recent Labs    11/11/19 0933 02/09/20 0943 08/16/20 1245 08/29/20 0952 10/03/20 1343  NA 138   < > 135 136 136  K 4.1   < > 3.7 4.0 3.5  CL 102   < > 99 100 100  CO2 28   < >  $'26 26 26  't$ GLUCOSE 108*   < > 98 141* 106*  BUN 14   < > '13 14 11   '$ CREATININE 0.65   < > 0.68 0.67 0.68  CALCIUM 9.1   < > 9.2 9.1 9.1  GFRNONAA >60   < > >60 >60 >60  GFRAA >60  --   --   --   --   PROT 8.6*   < > 8.1 7.8 8.0  ALBUMIN 4.3   < > 4.2 4.1 4.3  AST 19   < > '19 17 18  '$ ALT 18   < > '19 13 17  '$ ALKPHOS 51   < > 55 51 55  BILITOT 0.9   < > 1.3* 1.0 1.0   < > = values in this interval not displayed.      ASSESSMENT & PLAN:  1. CML (chronic myelocytic leukemia) ([place])   2. Encounter for antineoplastic chemotherapy   #CML, in molecular remission It is unclear the reason of positive BCR ABL 1 QT PCR result.  Possible lab error versus a transient detection of BCR ABL 1 due to decreased absorption/drug interaction during her COVID-19 infection. Repeat blood work on 10/03/2020 showed that she remains in molecular remission. Continue to Cigna 300 mg twice daily. e spent sufficient time to discuss many aspect of care, questions were answered to patient's satisfaction.  Return of visit: Follow-up in 3 months  [name], MD, PhD Hematology Oncology [facility] at [facility] Pager- [phone] 10/17/2020

## 2020-10-26 ENCOUNTER — Other Ambulatory Visit: Payer: Self-pay

## 2020-10-26 ENCOUNTER — Encounter: Payer: Self-pay | Admitting: Oncology

## 2020-10-26 DIAGNOSIS — C9210 Chronic myeloid leukemia, BCR/ABL-positive, not having achieved remission: Secondary | ICD-10-CM

## 2020-10-26 MED ORDER — NILOTINIB HCL 150 MG PO CAPS: ORAL_CAPSULE | ORAL | 1 refills | Status: DC

## 2020-10-30 ENCOUNTER — Encounter: Payer: Self-pay | Admitting: Internal Medicine

## 2020-11-21 ENCOUNTER — Other Ambulatory Visit: Payer: BC Managed Care – PPO

## 2020-11-21 ENCOUNTER — Ambulatory Visit: Payer: BC Managed Care – PPO | Admitting: Oncology

## 2020-12-21 ENCOUNTER — Ambulatory Visit: Payer: BC Managed Care – PPO | Admitting: Endocrinology

## 2020-12-21 ENCOUNTER — Other Ambulatory Visit: Payer: Self-pay

## 2020-12-21 VITALS — BP 110/76 | HR 82 | Ht 64.0 in | Wt 190.8 lb

## 2020-12-21 DIAGNOSIS — E042 Nontoxic multinodular goiter: Secondary | ICD-10-CM | POA: Diagnosis not present

## 2020-12-21 DIAGNOSIS — E039 Hypothyroidism, unspecified: Secondary | ICD-10-CM | POA: Diagnosis not present

## 2020-12-21 LAB — TSH: TSH: 15.78 u[IU]/mL — ABNORMAL HIGH (ref 0.35–5.50)

## 2020-12-21 LAB — T4, FREE: Free T4: 0.79 ng/dL (ref 0.60–1.60)

## 2020-12-21 MED ORDER — LEVOTHYROXINE SODIUM 25 MCG PO TABS: 37.5000 ug | ORAL_TABLET | Freq: Every day | ORAL | 3 refills | Status: DC

## 2020-12-21 NOTE — Patient Instructions (Addendum)
Let's recheck the ultrasound.  you will receive a phone call, about a day and time for an appointment.   It is best to never miss the medication.  However, if you do miss it, next best is to double up the next time.   Blood tests are requested for you today.  We'll let you know about the results.   Please come back for a follow-up appointment in 1 year.

## 2020-12-21 NOTE — Progress Notes (Signed)
Subjective:    Patient ID: Jennifer English, female    DOB: October 26, 1965, 55 y.o.   MRN: [id number]  HPI Pt returns for f/u of chronic primary hypothyroidism (dx'ed in 1992; she started synthroid in 2005; she did not tolerate 75/d (palpitations); pt says she was noted in [place] to have a thyroid nodule (2007).  She had [place] and nuc med scan.  She says she was told result was benign, but never had bx; [place] (2021): need annual f/u).  pt states she feels well in general.  She now takes just 25/d.   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     Past Surgical History:  Procedure Laterality Date   CESAREAN SECTION     x 1 2003    CYSTECTOMY     middle finger right hand    OTHER SURGICAL HISTORY     cyst removed from finger   TONSILECTOMY, ADENOIDECTOMY, BILATERAL MYRINGOTOMY AND TUBES     TYMPANOSTOMY TUBE PLACEMENT     1978    Social History   Socioeconomic History   Marital status: Married    Spouse name: Not on file   Number of children: Not on file   Years of education: Not on file   Highest education level: Not on file  Occupational History   Not on file  Tobacco Use   Smoking status: Never   Smokeless tobacco: Never  Vaping Use   Vaping Use: Never used  Substance and Sexual Activity   Alcohol use: No   Drug use: No   Sexual activity: Yes    Birth control/protection: None  Other Topics Concern   Not on file  Social History Narrative   Married lives with daughter husband and mother in law   1 daughter    Works Engineer, 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     hydrocortisone (ANUSOL-HC) 2.5 % rectal cream Place 1 application rectally 3 (three) times daily. Prn 30 g 11   nilotinib (TASIGNA) 150 MG capsule TAKE 2 CAPSULES (300MG ) BY  MOUTH TWICE DAILY (12 HOURS APART) ON AN EMPTY STOMACH  1 HOUR BEFORE OR 2 HOURS  AFTER A MEAL 112 capsule 1   vitamin C (ASCORBIC ACID) 500 MG tablet Take 500 mg by mouth daily.     No current facility-administered medications on file prior to visit.    Allergies  Allergen Reactions   Ibuprofen    Levoxyl [Levothyroxine Sodium]  Rash     Synthroid [Levothyroxine]     rash   Aspirin Rash   Avocado Rash   Eggs Or Egg-Derived Products Rash   Penicillins Rash    Family History  Problem Relation Age of Onset   Hyperthyroidism Mother    Heart disease Father        CAD with stents    Hyperlipidemia Father    Hypertension Father    Dementia Maternal Grandfather    Melanoma Paternal Grandmother    Bladder Cancer Paternal Grandmother    Cancer Paternal Grandmother        lung/colon ?primary    Arthritis Paternal Grandmother    Bladder Cancer Paternal Grandfather    Cancer Paternal Grandfather        bladder died in 68s    Early death Paternal Grandfather    Alcohol abuse Maternal Grandmother    Hepatitis Maternal Grandmother    Early death Maternal Grandmother    Kidney disease Maternal Grandmother    Macular degeneration Maternal Grandmother    Liver disease Maternal Grandmother        ?alcoholic hepatitis    Hyperthyroidism Sister    Asthma Brother    Asthma Brother     Macular degeneration Maternal Aunt    Breast cancer Neg Hx     BP 110/76 (BP Location: Right Arm, Patient Position: Sitting, Cuff Size: Large)   Pulse 82   Ht 5\' 4"  (1.626 m)   Wt 190 lb 12.8 oz (86.5 kg)   LMP 06/13/2017   SpO2 98%   BMI 32.75 kg/m    Review of Systems     Objective:   Physical Exam NECK: thyroid is twice normal size and firm; irreg surface, but no palpable nodule.   Lab Results  Component Value Date   TSH 15.78 (H) 12/21/2020      Assessment & Plan:  Hypothyroidism: uncontrolled.  I advised pt to increase synthroid to 7.5/d MNG: recheck [place]

## 2020-12-28 ENCOUNTER — Other Ambulatory Visit: Payer: Self-pay | Admitting: *Deleted

## 2020-12-28 DIAGNOSIS — C9210 Chronic myeloid leukemia, BCR/ABL-positive, not having achieved remission: Secondary | ICD-10-CM

## 2020-12-28 MED ORDER — NILOTINIB HCL 150 MG PO CAPS: ORAL_CAPSULE | ORAL | 1 refills | Status: DC

## 2020-12-29 ENCOUNTER — Encounter: Payer: Self-pay | Admitting: Endocrinology

## 2021-01-05 ENCOUNTER — Other Ambulatory Visit: Payer: Self-pay

## 2021-01-05 DIAGNOSIS — E039 Hypothyroidism, unspecified: Secondary | ICD-10-CM

## 2021-01-05 MED ORDER — LEVOTHYROXINE SODIUM 25 MCG PO TABS: 37.5000 ug | ORAL_TABLET | Freq: Every day | ORAL | 3 refills | Status: DC

## 2021-01-05 NOTE — Telephone Encounter (Signed)
Patient called to follow up on RX to Levothyroxine - it show NO PRINT in the RX from 12/21/2020.  Patient did send this MyChart message on 12/29/20 to follow up.   Please send  [facility] [address] in [place]. Patient has just a few pills left before being out completely.

## 2021-01-17 ENCOUNTER — Other Ambulatory Visit: Payer: Self-pay

## 2021-01-17 ENCOUNTER — Inpatient Hospital Stay: Payer: BC Managed Care – PPO | Attending: Oncology

## 2021-01-17 DIAGNOSIS — C9210 Chronic myeloid leukemia, BCR/ABL-positive, not having achieved remission: Secondary | ICD-10-CM | POA: Diagnosis not present

## 2021-01-17 LAB — CBC WITH DIFFERENTIAL/PLATELET
Abs Immature Granulocytes: 0.02 10*3/uL (ref 0.00–0.07)
Basophils Absolute: 0 10*3/uL (ref 0.0–0.1)
Basophils Relative: 1 %
Eosinophils Absolute: 0.3 10*3/uL (ref 0.0–0.5)
Eosinophils Relative: 6 %
HCT: 39.5 % (ref 36.0–46.0)
Hemoglobin: 13.4 g/dL (ref 12.0–15.0)
Immature Granulocytes: 1 %
Lymphocytes Relative: 33 %
Lymphs Abs: 1.4 10*3/uL (ref 0.7–4.0)
MCH: 31.6 pg (ref 26.0–34.0)
MCHC: 33.9 g/dL (ref 30.0–36.0)
MCV: 93.2 fL (ref 80.0–100.0)
Monocytes Absolute: 0.3 10*3/uL (ref 0.1–1.0)
Monocytes Relative: 7 %
Neutro Abs: 2.4 10*3/uL (ref 1.7–7.7)
Neutrophils Relative %: 52 %
Platelets: 238 10*3/uL (ref 150–400)
RBC: 4.24 MIL/uL (ref 3.87–5.11)
RDW: 11.9 % (ref 11.5–15.5)
WBC: 4.4 10*3/uL (ref 4.0–10.5)
nRBC: 0 % (ref 0.0–0.2)

## 2021-01-17 LAB — COMPREHENSIVE METABOLIC PANEL
ALT: 17 U/L (ref 0–44)
AST: 18 U/L (ref 15–41)
Albumin: 4.3 g/dL (ref 3.5–5.0)
Alkaline Phosphatase: 54 U/L (ref 38–126)
Anion gap: 6 (ref 5–15)
BUN: 10 mg/dL (ref 6–20)
CO2: 28 mmol/L (ref 22–32)
Calcium: 8.8 mg/dL — ABNORMAL LOW (ref 8.9–10.3)
Chloride: 100 mmol/L (ref 98–111)
Creatinine, Ser: 0.69 mg/dL (ref 0.44–1.00)
GFR, Estimated: >60 mL/min (ref >60)
Glucose, Bld: 97 mg/dL (ref 70–99)
Potassium: 3.7 mmol/L (ref 3.5–5.1)
Sodium: 134 mmol/L — ABNORMAL LOW (ref 135–145)
Total Bilirubin: 1 mg/dL (ref 0.3–1.2)
Total Protein: 7.8 g/dL (ref 6.5–8.1)

## 2021-01-20 LAB — BCR-ABL1 FISH
Cells Analyzed: 200
Cells Counted: 200

## 2021-01-25 LAB — BCR-ABL1, CML/ALL, PCR, QUANT: Interpretation (BCRAL):: NEGATIVE

## 2021-01-31 ENCOUNTER — Inpatient Hospital Stay: Payer: BC Managed Care – PPO | Admitting: Oncology

## 2021-01-31 ENCOUNTER — Telehealth: Payer: Self-pay | Admitting: Oncology

## 2021-01-31 NOTE — Telephone Encounter (Signed)
Left pt VM to reschedule appt from 11/9

## 2021-01-31 NOTE — Telephone Encounter (Signed)
Pt called to cancel her appt for today. Please give her a call to reschedule at [phone]

## 2021-02-02 NOTE — Telephone Encounter (Signed)
Please contact pt to r/s MD, she had labs already on 10/26. Thanks

## 2021-02-02 NOTE — Telephone Encounter (Signed)
Sent a message through [place] and [place]by left VM just to her to call or we could do a mychart.

## 2021-02-12 ENCOUNTER — Telehealth: Payer: Self-pay | Admitting: Pharmacy Technician

## 2021-02-12 ENCOUNTER — Other Ambulatory Visit (HOSPITAL_COMMUNITY): Payer: Self-pay

## 2021-02-12 NOTE — Telephone Encounter (Addendum)
Oral Oncology Patient Advocate Encounter   Received notification from [facility] that the existing prior authorization for Tasigna is due for renewal.   Renewal PA submitted on CoverMyMeds Key [id number]  Status is pending   Oral Oncology Clinic will continue to follow.  [name] Patient [place] Phone [phone] Fax [phone] 02/12/2021 2:05 PM

## 2021-02-12 NOTE — Telephone Encounter (Signed)
Oral Oncology Patient Advocate Encounter  Prior Authorization for Jennifer English has been approved.    PA# [id number] Effective dates: 02/12/21 through 02/12/22  Oral Oncology Clinic will continue to follow.   [place] Patient [name] Phone [phone] Fax [phone] 02/12/2021 3:23 PM

## 2021-02-17 ENCOUNTER — Encounter: Payer: Self-pay | Admitting: Oncology

## 2021-02-19 ENCOUNTER — Other Ambulatory Visit: Payer: Self-pay

## 2021-02-19 DIAGNOSIS — C9210 Chronic myeloid leukemia, BCR/ABL-positive, not having achieved remission: Secondary | ICD-10-CM

## 2021-02-20 ENCOUNTER — Telehealth: Payer: Self-pay | Admitting: Oncology

## 2021-02-20 NOTE — Telephone Encounter (Signed)
Pt called to set up an appt. She also needs a refill. She has tried to send a message Psychologist, prison and probation services. Please give her a call back at [phone]

## 2021-02-20 NOTE — Telephone Encounter (Signed)
Will need to set up appt  before refill, per MD.

## 2021-02-23 ENCOUNTER — Inpatient Hospital Stay: Payer: BC Managed Care – PPO | Attending: Oncology | Admitting: Oncology

## 2021-02-23 ENCOUNTER — Other Ambulatory Visit: Payer: Self-pay

## 2021-02-23 ENCOUNTER — Encounter: Payer: Self-pay | Admitting: Oncology

## 2021-02-23 VITALS — BP 116/78 | HR 76 | Temp 97.1°F | Resp 18 | Wt 186.6 lb

## 2021-02-23 DIAGNOSIS — C9210 Chronic myeloid leukemia, BCR/ABL-positive, not having achieved remission: Secondary | ICD-10-CM

## 2021-02-23 DIAGNOSIS — C9211 Chronic myeloid leukemia, BCR/ABL-positive, in remission: Secondary | ICD-10-CM | POA: Insufficient documentation

## 2021-02-23 DIAGNOSIS — Z5111 Encounter for antineoplastic chemotherapy: Secondary | ICD-10-CM

## 2021-02-23 MED ORDER — NILOTINIB HCL 150 MG PO CAPS: ORAL_CAPSULE | ORAL | 2 refills | Status: DC

## 2021-02-23 NOTE — Progress Notes (Signed)
Hematology/Oncology follow up note [phone]    Patient Care Team: McLean-Scocuzza, Nino Glow, MD as PCP - General (Internal Medicine)  REFERRING PROVIDER: Previous oncologist from [place] Dr.Hasan Rizvi CHIEF COMPLAINTS/PURPOSE OF VISIT:  Management of CML  HISTORY OF PRESENTING ILLNESS:  Jennifer English is a  55 y.o.  female with PMH listed below who was referred to by her oncologist in [place]. [place] this summer. She has not had local pcp yet. Extensive medical records review was performed.  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 She had dyuria symptoms at that time and lab work up found extreme leukocytosis with WBC 173,500 and she was initially started on hydrea. After CML was confirmed, she was started on Tasigna 300mg  BID since then and has achieved remission. . Also had splenomegaly at presentation about 5-6 cm below LCM, resolved. . She reports being complaint on nilotinib however, she has had period of time when her medication runs out. Although she moved this summer, due to lot of stresses associated with relocation and new job, she waited until now see hematologist.  Denies any fever, chill, weight loss, bleeding events. She used to have EKG done every 3 months and faxed to oncologist. She has not had any EKG done after July this year. No PCP Also reports recently feeling urine has odor and also very mild burning sensation when passing urine. She requests to have urine tested.  Has hypothyroidism, not taking thyroid supplements.  History of iron deficiency due to heavy menstrual periods/uterine fibroids, taking iron supplements.  # 10/09/2012, bone marrow aspirate smears and clots: Markedly hypercellular marrow with prominent myeloid hyperplasia and increasing small hypolobated megakaryocytes. The overall findings are consistent with chronic myeloid leukemia, chronic phase. # 03/22/2015 BCR ABL quantification PCR showed e13a2  (b2a2 p210) transcript <0.001%,e14a2 ( b3a2, p210)  transcript 0.023%, e1a2 (p190) transcript <0.001% # 08/17/2015 BCR ABL quantification PCR showed e13a2 (b2a2 p210) transcript 0.011%,e14a2 ( b3a2, p210)  transcript <0.001%, e1a2 (p190) transcript <0.001%  # 04/25/2016 BCR ABL quantification PCR showed e13a2 (b2a2 p210) transcript <0.001%,e14a2 ( b3a2, p210)  transcript <0.001%, e1a2 (p190) transcript <0.001%  #09/17/2016  BCR ABL quantification PCR showed e13a2 (b2a2 p210) transcript <0.0032%,e14a2 ( b3a2, p210)  transcript <0.0032%, e1a2 (p190) transcript <0.0032%   02/19/2017 BCR ABL quantification PCR showed b2a2 transcript <0.0032%, b3a2 transcript <0.0032%, E1A2 transcript <0.0032%  cardiology Dr.End was seen by Dr. Velva Harman for abnormal EKG.  In Dr. Tyrell Antonio office, her EKG was normal.  Not consistent with septal infarct.  Early May 2022 patient has had COVID infection.   08/29/2020, BCR ABL 1 FISH negative, BCR ABL 1 quantification PCR positive for BCR-ABL1 e13a2 (b2a2, p210) and e14a2 (b3a2, p210), IS 9.3570. Patient was recommended to repeat testing which was done on 10/03/2020. 10/03/2020, BCR ABL 1 FISH negative, qualification PCR all negative, achieved MR again.        INTERVAL HISTORY Jennifer English is a 55 y.o. female who has above history reviewed by me today presents for follow up of CML.  She reports feeling well. No new complaints.  Husband recently had A Fib and she has to reschedule appointment.    Review of Systems  Constitutional:  Negative for appetite change, chills, fatigue and fever.  HENT:   Negative for hearing loss and voice change.   Eyes:  Negative for eye problems.  Respiratory:  Negative for chest tightness and cough.   Cardiovascular:  Negative for chest  pain.  Gastrointestinal:  Negative for abdominal distention, abdominal pain and blood in stool.  Endocrine: Negative for hot flashes.  Genitourinary:  Negative for difficulty urinating and frequency.    Musculoskeletal:  Negative for arthralgias.  Skin:  Negative for itching and rash.  Neurological:  Negative for extremity weakness.  Hematological:  Negative for adenopathy.  Psychiatric/Behavioral:  Negative for confusion.   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     SURGICAL HISTORY: Past Surgical History:  Procedure Laterality Date   CESAREAN SECTION     x 1 2003    CYSTECTOMY     middle finger right hand    OTHER SURGICAL HISTORY     cyst removed from finger   TONSILECTOMY, ADENOIDECTOMY, BILATERAL MYRINGOTOMY AND TUBES     TYMPANOSTOMY TUBE PLACEMENT     1978    SOCIAL HISTORY: Social History   Socioeconomic History   Marital status: Married    Spouse name: Not on file   Number of children: Not on file   Years of education: Not on file   Highest education level: Not on file  Occupational History   Not on file  Tobacco Use   Smoking status: Never   Smokeless tobacco: Never  Vaping Use   Vaping Use: Never used  Substance and Sexual Activity   Alcohol use: No   Drug use: No   Sexual activity: Yes    Birth control/protection: None  Other Topics Concern   Not on file  Social History Narrative   Married lives with daughter husband and mother in law   1 daughter    Works Engineer, 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   [place] from [place] 2018    Social Determinants of Health   Financial Resource Strain: Not on file  Food Insecurity: Not on file  Transportation Needs: Not on file  Physical Activity: Not on file  Stress: Not on file  Social Connections: Not on file  Intimate Partner  Violence: Not on file    FAMILY HISTORY: Family History  Problem Relation Age of Onset   Hyperthyroidism Mother    Heart disease Father        CAD with stents    Hyperlipidemia Father    Hypertension Father    Dementia Maternal Grandfather    Melanoma Paternal Grandmother    Bladder Cancer Paternal Grandmother    Cancer Paternal Grandmother        lung/colon ?primary    Arthritis Paternal Grandmother    Bladder Cancer Paternal Grandfather    Cancer Paternal Grandfather        bladder died in 2s    Early death Paternal Grandfather    Alcohol abuse Maternal Grandmother    Hepatitis Maternal Grandmother    Early death Maternal Grandmother    Kidney disease Maternal Grandmother    Macular degeneration Maternal Grandmother    Liver disease Maternal Grandmother        ?alcoholic hepatitis    Hyperthyroidism Sister  Asthma Brother    Asthma Brother    Macular degeneration Maternal Aunt    Breast cancer Neg Hx     ALLERGIES:  is allergic to ibuprofen, levoxyl [levothyroxine sodium], synthroid [levothyroxine], aspirin, avocado, eggs or egg-derived products, and penicillins.  MEDICATIONS:  Current Outpatient Medications  Medication Sig Dispense Refill   fexofenadine (ALLEGRA) 180 MG tablet Take 180 mg by mouth daily as needed for allergies or rhinitis.     levothyroxine (SYNTHROID) 25 MCG tablet Take 1.5 tablets (37.5 mcg total) by mouth daily before breakfast. 140 tablet 3   nilotinib (TASIGNA) 150 MG capsule TAKE 2 CAPSULES (300MG ) BY  MOUTH TWICE DAILY (12 HOURS APART) ON AN EMPTY STOMACH  1 HOUR BEFORE OR 2 HOURS  AFTER A MEAL 112 capsule 1   No current facility-administered medications for this visit.     PHYSICAL EXAMINATION: ECOG PERFORMANCE STATUS: 0 - Asymptomatic Vitals:   02/23/21 0840  BP: 116/78  Pulse: 76  Resp: 18  Temp: (!) 97.1 F (36.2 C)   Filed Weights   02/23/21 0840  Weight: 186 lb 9.6 oz (84.6 kg)   Physical Exam Constitutional:       General: She is not in acute distress.    Appearance: She is not diaphoretic.  HENT:     Head: Normocephalic and atraumatic.     Nose: Nose normal.     Mouth/Throat:     Pharynx: No oropharyngeal exudate.  Eyes:     General: No scleral icterus.    Pupils: Pupils are equal, round, and reactive to light.  Cardiovascular:     Rate and Rhythm: Normal rate and regular rhythm.     Heart sounds: No murmur heard. Pulmonary:     Effort: Pulmonary effort is normal. No respiratory distress.     Breath sounds: No rales.  Chest:     Chest wall: No tenderness.  Abdominal:     General: There is no distension.     Palpations: Abdomen is soft.     Tenderness: There is no abdominal tenderness.  Musculoskeletal:        General: Normal range of motion.     Cervical back: Normal range of motion and neck supple.  Skin:    General: Skin is warm and dry.     Findings: No erythema.  Neurological:     Mental Status: She is alert and oriented to person, place, and time.     Cranial Nerves: No cranial nerve deficit.     Motor: No abnormal muscle tone.     Coordination: Coordination normal.  Psychiatric:        Mood and Affect: Affect normal.   LABORATORY DATA:  I have reviewed the data as listed Lab Results  Component Value Date   WBC 4.4 01/17/2021   HGB 13.4 01/17/2021   HCT 39.5 01/17/2021   MCV 93.2 01/17/2021   PLT 238 01/17/2021   Recent Labs    08/29/20 0952 10/03/20 1343 01/17/21 1136  NA 136 136 134*  K 4.0 3.5 3.7  CL 100 100 100  CO2 26 26 28   GLUCOSE 141* 106* 97  BUN 14 11 10   CREATININE 0.67 0.68 0.69  CALCIUM 9.1 9.1 8.8*  GFRNONAA >60 >60 >60  PROT 7.8 8.0 7.8  ALBUMIN 4.1 4.3 4.3  AST 17 18 18   ALT 13 17 17   ALKPHOS 51 55 54  BILITOT 1.0 1.0 1.0      ASSESSMENT & PLAN:  1. CML (chronic myelocytic  leukemia) ([place])   2. Encounter for antineoplastic chemotherapy   #CML, in molecular remission Labs are reviewed and discussed with patient And patient is  medically remission-MR 4 Continue Tasigna 300mg  BID.  Will obtain EKG at the next visit.  Return of visit: Follow-up in 3 months  [name], MD, PhD 02/23/2021

## 2021-02-23 NOTE — Progress Notes (Signed)
Pt here for follow up. No new concerns voiced.   

## 2021-05-25 ENCOUNTER — Inpatient Hospital Stay: Payer: BC Managed Care – PPO | Attending: Oncology

## 2021-06-07 ENCOUNTER — Telehealth: Payer: Self-pay | Admitting: Pharmacy Technician

## 2021-06-07 NOTE — Telephone Encounter (Signed)
Oral Oncology Patient Advocate Encounter ? ?Received fax notification from [facility] that they have been unable to reach the patient to refill Tasigna. ? ?I called the patient and it went straight to voicemail.  I left her a message to return my call or call [facility] for a refill. ? ?Dennison Nancy CPHT ?Specialty Pharmacy Patient Advocate ?[place] ?Phone [phone] ?Fax [phone] ?06/07/2021 11:24 AM ? ?

## 2021-06-08 ENCOUNTER — Inpatient Hospital Stay: Payer: BC Managed Care – PPO | Attending: Oncology

## 2021-06-08 ENCOUNTER — Inpatient Hospital Stay: Payer: BC Managed Care – PPO | Admitting: Oncology

## 2021-06-08 DIAGNOSIS — C9210 Chronic myeloid leukemia, BCR/ABL-positive, not having achieved remission: Secondary | ICD-10-CM | POA: Insufficient documentation

## 2021-06-08 LAB — COMPREHENSIVE METABOLIC PANEL
ALT: 16 U/L (ref 0–44)
AST: 18 U/L (ref 15–41)
Albumin: 4.1 g/dL (ref 3.5–5.0)
Alkaline Phosphatase: 59 U/L (ref 38–126)
Anion gap: 7 (ref 5–15)
BUN: 12 mg/dL (ref 6–20)
CO2: 27 mmol/L (ref 22–32)
Calcium: 9.1 mg/dL (ref 8.9–10.3)
Chloride: 102 mmol/L (ref 98–111)
Creatinine, Ser: 0.76 mg/dL (ref 0.44–1.00)
GFR, Estimated: >60 mL/min (ref >60)
Glucose, Bld: 103 mg/dL — ABNORMAL HIGH (ref 70–99)
Potassium: 4.1 mmol/L (ref 3.5–5.1)
Sodium: 136 mmol/L (ref 135–145)
Total Bilirubin: 1.1 mg/dL (ref 0.3–1.2)
Total Protein: 7.9 g/dL (ref 6.5–8.1)

## 2021-06-08 LAB — CBC WITH DIFFERENTIAL/PLATELET
Abs Immature Granulocytes: 0.01 10*3/uL (ref 0.00–0.07)
Basophils Absolute: 0 10*3/uL (ref 0.0–0.1)
Basophils Relative: 1 %
Eosinophils Absolute: 0.3 10*3/uL (ref 0.0–0.5)
Eosinophils Relative: 7 %
HCT: 39.3 % (ref 36.0–46.0)
Hemoglobin: 13.6 g/dL (ref 12.0–15.0)
Immature Granulocytes: 0 %
Lymphocytes Relative: 30 %
Lymphs Abs: 1.3 10*3/uL (ref 0.7–4.0)
MCH: 32.1 pg (ref 26.0–34.0)
MCHC: 34.6 g/dL (ref 30.0–36.0)
MCV: 92.7 fL (ref 80.0–100.0)
Monocytes Absolute: 0.3 10*3/uL (ref 0.1–1.0)
Monocytes Relative: 6 %
Neutro Abs: 2.3 10*3/uL (ref 1.7–7.7)
Neutrophils Relative %: 56 %
Platelets: 246 10*3/uL (ref 150–400)
RBC: 4.24 MIL/uL (ref 3.87–5.11)
RDW: 11.8 % (ref 11.5–15.5)
WBC: 4.2 10*3/uL (ref 4.0–10.5)
nRBC: 0 % (ref 0.0–0.2)

## 2021-06-12 LAB — BCR-ABL1 FISH
Cells Analyzed: 200
Cells Counted: 200

## 2021-06-14 LAB — BCR-ABL1, CML/ALL, PCR, QUANT: Interpretation (BCRAL):: NEGATIVE

## 2021-06-21 ENCOUNTER — Other Ambulatory Visit: Payer: Self-pay | Admitting: Endocrinology

## 2021-06-21 ENCOUNTER — Encounter: Payer: Self-pay | Admitting: Endocrinology

## 2021-06-21 DIAGNOSIS — E039 Hypothyroidism, unspecified: Secondary | ICD-10-CM

## 2021-06-21 MED ORDER — LEVOTHYROXINE SODIUM 25 MCG PO TABS: 37.5000 ug | ORAL_TABLET | Freq: Every day | ORAL | 3 refills | Status: DC

## 2021-06-28 ENCOUNTER — Encounter: Payer: Self-pay | Admitting: Oncology

## 2021-06-28 ENCOUNTER — Inpatient Hospital Stay: Payer: BC Managed Care – PPO | Attending: Oncology | Admitting: Oncology

## 2021-06-28 VITALS — BP 124/86 | HR 83 | Temp 97.8°F | Resp 16 | Wt 187.0 lb

## 2021-06-28 DIAGNOSIS — C9210 Chronic myeloid leukemia, BCR/ABL-positive, not having achieved remission: Secondary | ICD-10-CM | POA: Diagnosis not present

## 2021-06-28 DIAGNOSIS — E039 Hypothyroidism, unspecified: Secondary | ICD-10-CM | POA: Diagnosis not present

## 2021-06-28 DIAGNOSIS — R011 Cardiac murmur, unspecified: Secondary | ICD-10-CM | POA: Diagnosis not present

## 2021-06-28 DIAGNOSIS — F419 Anxiety disorder, unspecified: Secondary | ICD-10-CM | POA: Insufficient documentation

## 2021-06-28 DIAGNOSIS — Z5111 Encounter for antineoplastic chemotherapy: Secondary | ICD-10-CM

## 2021-06-28 MED ORDER — NILOTINIB HCL 150 MG PO CAPS: ORAL_CAPSULE | ORAL | 2 refills | Status: DC

## 2021-06-28 NOTE — Progress Notes (Signed)
?Hematology/Oncology follow up note ?[phone]  ? ? ?Patient Care Team: ?McLean-Scocuzza, Nino Glow, MD as PCP - General (Internal Medicine) ? ?REFERRING PROVIDER: ?Previous oncologist from [place] ?CHIEF COMPLAINTS/PURPOSE OF VISIT:  ?Management of CML ? ?HISTORY OF PRESENTING ILLNESS:  ?Jennifer English is a  56 y.o.  female with PMH listed below who was referred to by her oncologist in [place]. [place] to [place] this summer. She has not had local pcp yet. Extensive medical records review was performed.  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 She had dyuria symptoms at that time and lab work up found extreme leukocytosis with WBC 173,500 and she was initially started on hydrea. After CML was confirmed, she was started on Tasigna '300mg'$  BID since then and has achieved remission. . Also had splenomegaly at presentation about 5-6 cm below LCM, resolved. Marland Kitchen ?She reports being complaint on nilotinib however, she has had period of time when her medication runs out. Although she moved this summer, due to lot of stresses associated with relocation and new job, she waited until now see hematologist.  ?Denies any fever, chill, weight loss, bleeding events. She used to have EKG done every 3 months and faxed to oncologist. She has not had any EKG done after July this year. No PCP ?Also reports recently feeling urine has odor and also very mild burning sensation when passing urine. She requests to have urine tested.  ?Has hypothyroidism, not taking thyroid supplements.  ?History of iron deficiency due to heavy menstrual periods/uterine fibroids, taking iron supplements. ? ?# 10/09/2012, bone marrow aspirate smears and clots: Markedly hypercellular marrow with prominent myeloid hyperplasia and increasing small hypolobated megakaryocytes. The overall findings are consistent with chronic myeloid leukemia, chronic phase. ?# 03/22/2015 BCR ABL quantification PCR showed e13a2  (b2a2 p210) transcript <0.001%,e14a2 ( b3a2, p210)  transcript 0.023%, e1a2 (p190) transcript <0.001% ?# 08/17/2015 BCR ABL quantification PCR showed e13a2 (b2a2 p210) transcript 0.011%,e14a2 ( b3a2, p210)  transcript <0.001%, e1a2 (p190) transcript <0.001%  ?# 04/25/2016 BCR ABL quantification PCR showed e13a2 (b2a2 p210) transcript <0.001%,e14a2 ( b3a2, p210)  transcript <0.001%, e1a2 (p190) transcript <0.001%  ?#09/17/2016  BCR ABL quantification PCR showed e13a2 (b2a2 p210) transcript <0.0032%,e14a2 ( b3a2, p210)  transcript <0.0032%, e1a2 (p190) transcript <0.0032%  ? ?02/19/2017 BCR ABL quantification PCR showed b2a2 transcript <0.0032%, b3a2 transcript <0.0032%, E1A2 transcript <0.0032% ? ?cardiology Dr.End was seen by Dr. Velva Harman for abnormal EKG.  In Dr. Tyrell Antonio office, her EKG was normal.  Not consistent with septal infarct. ? ?Early May 2022 patient has had COVID infection.   ?08/29/2020, BCR ABL 1 FISH negative, ?BCR ABL 1 quantification PCR positive for BCR-ABL1 e13a2 (b2a2, p210) and e14a2 (b3a2, p210), IS 2.3762. ?Patient was recommended to repeat testing which was done on 10/03/2020. ?10/03/2020, BCR ABL 1 FISH negative, qualification PCR all negative, achieved MR again.  ? ? ? ? ? ? ?INTERVAL HISTORY ?Jennifer English is a 56 y.o. female who has above history reviewed by me today presents for follow up of CML.  ? She feels anxious today due to some life stressors.  Otherwise she is doing well. ? ? ?Review of Systems  ?Constitutional:  Negative for appetite change, chills, fatigue and fever.  ?HENT:   Negative for hearing loss and voice change.   ?Eyes:  Negative for eye problems.  ?Respiratory:  Negative for chest tightness and cough.   ?Cardiovascular:  Negative for chest pain.  ?Gastrointestinal:  Negative for abdominal distention, abdominal pain and blood in stool.  ?Endocrine: Negative for hot flashes.  ?Genitourinary:  Negative for difficulty urinating and frequency.   ?Musculoskeletal:  Negative for  arthralgias.  ?Skin:  Negative for itching and rash.  ?Neurological:  Negative for extremity weakness.  ?Hematological:  Negative for adenopathy.  ?Psychiatric/Behavioral:  Negative for confusion.   ?MEDICAL HISTORY:  ?Past Medical History:  ?Diagnosis Date  ? Allergy   ? CML (chronic myelocytic leukemia) ([place])   ? leukemia  ? Fibroids   ? Heart murmur   ? Thyroid disease   ? h/o thyroid d/o not on meds   ? Uterine fibroid   ? UTI (urinary tract infection)   ? x2   ? 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 ? ?SURGICAL HISTORY: ?Past Surgical History:  ?Procedure Laterality Date  ? CESAREAN SECTION    ? x 1 2003   ? CYSTECTOMY    ? middle finger right hand   ? OTHER SURGICAL HISTORY    ? cyst removed from finger  ? TONSILECTOMY, ADENOIDECTOMY, BILATERAL MYRINGOTOMY AND TUBES    ? TYMPANOSTOMY TUBE PLACEMENT    ? 1978  ? ? ?SOCIAL HISTORY: ?Social History  ? ?Socioeconomic History  ? Marital status: Married  ?  Spouse name: Not on file  ? Number of children: Not on file  ? Years of education: Not on file  ? Highest education level: Not on file  ?Occupational History  ? Not on file  ?Tobacco Use  ? Smoking status: Never  ? Smokeless tobacco: Never  ?Vaping Use  ? Vaping Use: Never used  ?Substance and Sexual Activity  ? Alcohol use: No  ? Drug use: No  ? Sexual activity: Yes  ?  Birth control/protection: None  ?Other Topics Concern  ? Not on file  ?Social History Narrative  ? Married lives with daughter husband and mother in law  ? 1 daughter   ? 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 High school ed  ? Moved from [place] 2018   ? ?Social Determinants of Health  ? ?Financial Resource Strain: Not on file  ?Food Insecurity: Not on file  ?Transportation Needs: Not on file  ?Physical Activity: Not on file  ?Stress: Not on file  ?Social Connections: Not on file  ?Intimate Partner Violence: Not on file  ? ? ?FAMILY  HISTORY: ?Family History  ?Problem Relation Age of Onset  ? Hyperthyroidism Mother   ? Heart disease Father   ?     CAD with stents   ? Hyperlipidemia Father   ? Hypertension Father   ? Dementia Maternal Grandfather   ? Melanoma Paternal Grandmother   ? Bladder Cancer Paternal Grandmother   ? Cancer Paternal Grandmother   ?     lung/colon ?primary   ? Arthritis Paternal Grandmother   ? Bladder Cancer Paternal Grandfather   ? Cancer Paternal Grandfather   ?     bladder died in 65s   ? Early death Paternal Grandfather   ? Alcohol abuse Maternal Grandmother   ? Hepatitis Maternal Grandmother   ? Early death Maternal Grandmother   ? Kidney disease Maternal Grandmother   ? Macular degeneration Maternal Grandmother   ? Liver disease Maternal Grandmother   ?     ?alcoholic hepatitis   ? Hyperthyroidism Sister   ? Asthma Brother   ?  Asthma Brother   ? Macular degeneration Maternal Aunt   ? Breast cancer Neg Hx   ? ? ?ALLERGIES:  is allergic to ibuprofen, levoxyl [levothyroxine sodium], synthroid [levothyroxine], aspirin, avocado, eggs or egg-derived products, and penicillins. ? ?MEDICATIONS:  ?Current Outpatient Medications  ?Medication Sig Dispense Refill  ? fexofenadine (ALLEGRA) 180 MG tablet Take 180 mg by mouth daily as needed for allergies or rhinitis.    ? levothyroxine (SYNTHROID) 25 MCG tablet Take 1.5 tablets (37.5 mcg total) by mouth daily before breakfast. 140 tablet 3  ? nilotinib (TASIGNA) 150 MG capsule TAKE 2 CAPSULES ('300MG'$ ) BY  MOUTH TWICE DAILY (12 HOURS APART) ON AN EMPTY STOMACH  1 HOUR BEFORE OR 2 HOURS  AFTER A MEAL 120 capsule 2  ? ?No current facility-administered medications for this visit.  ? ? ? ?PHYSICAL EXAMINATION: ?ECOG PERFORMANCE STATUS: 0 - Asymptomatic ?Vitals:  ? 06/28/21 1044  ?BP: 124/86  ?Pulse: 83  ?Resp: 16  ?Temp: 97.8 ?F (36.6 ?C)  ?SpO2: 98%  ? ?Filed Weights  ? 06/28/21 1044  ?Weight: 187 lb (84.8 kg)  ? ?Physical Exam ?Constitutional:   ?   General: She is not in acute  distress. ?   Appearance: She is not diaphoretic.  ?HENT:  ?   Head: Normocephalic and atraumatic.  ?   Nose: Nose normal.  ?   Mouth/Throat:  ?   Pharynx: No oropharyngeal exudate.  ?Eyes:  ?   General: No scleral ic

## 2021-06-28 NOTE — Progress Notes (Signed)
Pt in for follow up reports being a little more anxious lately.  ?

## 2021-07-12 ENCOUNTER — Encounter: Payer: Self-pay | Admitting: Internal Medicine

## 2021-07-12 ENCOUNTER — Ambulatory Visit (INDEPENDENT_AMBULATORY_CARE_PROVIDER_SITE_OTHER): Payer: BC Managed Care – PPO | Admitting: Internal Medicine

## 2021-07-12 VITALS — BP 120/80 | HR 82 | Temp 98.8°F | Resp 14 | Ht 64.0 in | Wt 188.0 lb

## 2021-07-12 DIAGNOSIS — E785 Hyperlipidemia, unspecified: Secondary | ICD-10-CM | POA: Diagnosis not present

## 2021-07-12 DIAGNOSIS — E039 Hypothyroidism, unspecified: Secondary | ICD-10-CM | POA: Diagnosis not present

## 2021-07-12 DIAGNOSIS — Z124 Encounter for screening for malignant neoplasm of cervix: Secondary | ICD-10-CM

## 2021-07-12 DIAGNOSIS — E559 Vitamin D deficiency, unspecified: Secondary | ICD-10-CM

## 2021-07-12 DIAGNOSIS — E042 Nontoxic multinodular goiter: Secondary | ICD-10-CM

## 2021-07-12 DIAGNOSIS — Z1389 Encounter for screening for other disorder: Secondary | ICD-10-CM | POA: Diagnosis not present

## 2021-07-12 DIAGNOSIS — E538 Deficiency of other specified B group vitamins: Secondary | ICD-10-CM

## 2021-07-12 DIAGNOSIS — Z1231 Encounter for screening mammogram for malignant neoplasm of breast: Secondary | ICD-10-CM

## 2021-07-12 DIAGNOSIS — Z0000 Encounter for general adult medical examination without abnormal findings: Secondary | ICD-10-CM | POA: Diagnosis not present

## 2021-07-12 DIAGNOSIS — D251 Intramural leiomyoma of uterus: Secondary | ICD-10-CM

## 2021-07-12 DIAGNOSIS — M25551 Pain in right hip: Secondary | ICD-10-CM

## 2021-07-12 LAB — LIPID PANEL
Cholesterol: 244 mg/dL — ABNORMAL HIGH (ref 0–200)
HDL: 79.3 mg/dL (ref >39.00)
LDL Cholesterol: 149 mg/dL — ABNORMAL HIGH (ref 0–99)
NonHDL: 164.66
Total CHOL/HDL Ratio: 3
Triglycerides: 79 mg/dL (ref 0.0–149.0)
VLDL: 15.8 mg/dL (ref 0.0–40.0)

## 2021-07-12 LAB — TSH: TSH: 6.78 u[IU]/mL — ABNORMAL HIGH (ref 0.35–5.50)

## 2021-07-12 LAB — VITAMIN B12: Vitamin B-12: 367 pg/mL (ref 211–911)

## 2021-07-12 LAB — VITAMIN D 25 HYDROXY (VIT D DEFICIENCY, FRACTURES): VITD: 14.8 ng/mL — ABNORMAL LOW (ref 30.00–100.00)

## 2021-07-12 NOTE — Progress Notes (Signed)
Chief Complaint  ?Patient presents with  ? Annual Exam  ?  Labs with oncology 06/08/21. EKG 06/28/21 w/Dr.Yu, feeling well overall.  ? ?Annual  ?1. Hypothyroidism on levo 37.5 mcg qd est leb endocrine ?2. Cml now doing well on tasigna ekg 06/28/21 normal ?F/u Dr. Tasia Catchings ?3. C/o right hip pain new x 1.5 weeks nothing tried  ? ? ? ?Review of Systems  ?Constitutional:  Negative for weight loss.  ?HENT:  Negative for hearing loss.   ?Eyes:  Negative for blurred vision.  ?Respiratory:  Negative for shortness of breath.   ?Cardiovascular:  Negative for chest pain.  ?Gastrointestinal:  Negative for abdominal pain and blood in stool.  ?Genitourinary:  Negative for dysuria.  ?Musculoskeletal:  Positive for joint pain. Negative for falls.  ?Skin:  Negative for rash.  ?Neurological:  Negative for headaches.  ?Psychiatric/Behavioral:  Negative for depression.   ?Past Medical History:  ?Diagnosis Date  ? Allergy   ? CML (chronic myelocytic leukemia) ([facility])   ? leukemia  ? COVID-19   ? Fibroids   ? Heart murmur   ? Hemorrhoid   ? Thyroid disease   ? h/o thyroid d/o not on meds   ? Uterine fibroid   ? intramural  ? UTI (urinary tract infection)   ? x2   ? 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 ?Past Surgical History:  ?Procedure Laterality Date  ? CESAREAN SECTION    ? x 1 2003   ? CYSTECTOMY    ? middle finger right hand   ? OTHER SURGICAL HISTORY    ? cyst removed from finger  ? TONSILECTOMY, ADENOIDECTOMY, BILATERAL MYRINGOTOMY AND TUBES    ? TYMPANOSTOMY TUBE PLACEMENT    ? 1978  ? ?Family History  ?Problem Relation Age of Onset  ? Hyperthyroidism Mother   ? Heart disease Father   ?     CAD with stents   ? Hyperlipidemia Father   ? Hypertension Father   ? Dementia Maternal Grandfather   ? Melanoma Paternal Grandmother   ? Bladder Cancer Paternal Grandmother   ? Cancer Paternal Grandmother   ?     lung/colon ?primary   ? Arthritis Paternal Grandmother   ? Bladder Cancer Paternal Grandfather   ? Cancer Paternal  Grandfather   ?     bladder died in 70s   ? Early death Paternal Grandfather   ? Alcohol abuse Maternal Grandmother   ? Hepatitis Maternal Grandmother   ? Early death Maternal Grandmother   ? Kidney disease Maternal Grandmother   ? Macular degeneration Maternal Grandmother   ? Liver disease Maternal Grandmother   ?     ?alcoholic hepatitis   ? Hyperthyroidism Sister   ? Asthma Brother   ? Asthma Brother   ? Macular degeneration Maternal Aunt   ? Breast cancer Neg Hx   ? ?Social History  ? ?Socioeconomic History  ? Marital status: Married  ?  Spouse name: Not on file  ? Number of children: Not on file  ? Years of education: Not on file  ? Highest education level: Not on file  ?Occupational History  ? Not on file  ?Tobacco Use  ? Smoking status: Never  ? Smokeless tobacco: Never  ?Vaping Use  ? Vaping Use: Never used  ?Substance and Sexual Activity  ? Alcohol use: No  ? Drug use: No  ? Sexual activity: Yes  ?  Birth control/protection: None  ?Other Topics Concern  ?  Not on file  ?Social History Narrative  ? Married lives with daughter husband and mother in law  ? 1 daughter   ? 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 High school ed  ? Moved from [place] 2018   ? ?Social Determinants of Health  ? ?Financial Resource Strain: Not on file  ?Food Insecurity: Not on file  ?Transportation Needs: Not on file  ?Physical Activity: Not on file  ?Stress: Not on file  ?Social Connections: Not on file  ?Intimate Partner Violence: Not on file  ? ?Current Meds  ?Medication Sig  ? levothyroxine (SYNTHROID) 25 MCG tablet Take 1.5 tablets (37.5 mcg total) by mouth daily before breakfast.  ? nilotinib (TASIGNA) 150 MG capsule TAKE 2 CAPSULES ('300MG'$ ) BY  MOUTH TWICE DAILY (12 HOURS APART) ON AN EMPTY STOMACH  1 HOUR BEFORE OR 2 HOURS  AFTER A MEAL  ? ?Allergies  ?Allergen Reactions  ? Ibuprofen   ? Levoxyl [Levothyroxine Sodium]   ?  Rash  ?  ?  Synthroid [Levothyroxine]   ?  rash  ? Aspirin Rash  ? Avocado Rash  ? Eggs Or Egg-Derived Products Rash  ? Penicillins Rash  ? ?Recent Results (from the past 2160 hour(s))  ?BCR-ABL1 FISH     Status: None  ? Collection Time: 06/08/21  9:34 AM  ?Result Value Ref Range  ? Specimen Type BLOOD   ? Cells Counted 200   ? Cells Analyzed 200   ? FISH Result Comment:   ?  Comment: NORMAL:  NO BCR OR ABL1 GENE REARRANGEMENT OBSERVED  ? Interpretation Comment:   ?  Comment: (NOTE) ?            nuc ish 9q34(ASS1,ABL1)x2,22q11.2(BCRx2)[200]. ?     The fluorescence in situ hybridization (FISH) study ?was normal. FISH, using unique sequence DNA probes for the ?ABL1 and BCR gene regions showed two ABL1 signals (red), two ?control ASS1 gene signals (aqua) located adjacent to the ?ABL1 locus at 9q34, and two BCR signals (green) at 22q11.2 ?in all interphase nuclei examined. There was NO evidence of ?CML or ALL-associated BCR/ABL1 dual fusion signals in this ?analysis. . ?     This analysis is limited to abnormalities detectable ?by the specific probes included in the study. FISH results ?should be interpreted within the context of a full ?cytogenetic analysis and pathology evaluation.  A BCR-ABL1 ?gene fusion in greater than 3 interphase nuclei in a patient ?with a new clinical diagnosis is considered positive. The ?DNA probe vendor for this study was [employer] movement psychotherap[employer] ?[employer]). . ?     This test was developed and its performance ?characteristics determined by  [employer] [facility] [place] ([facility]). It has not been cleared or ?approved by the U.S. Food and Drug Administration. ?  ? Director Review: Comment:   ?  Comment: (NOTE) ?Dorothea Glassman, PhD ?Performed At: [facility] ?[address] [place], [place] [id number] ?Katina Degree MDPhD [id number] ?  ?Comprehensive metabolic panel     Status: Abnormal  ? Collection Time: 06/08/21  9:34 AM  ?Result Value Ref Range  ? Sodium 136 135 - 145 mmol/L  ? Potassium 4.1  3.5 - 5.1 mmol/L  ? Chloride 102 98 - 111 mmol/L  ? CO2 27 22 - 32 mmol/L  ? Glucose, Bld 103 (H) 70 - 99 mg/dL  ?  Comment: Glucose reference range applies  only to samples taken after fasting for at least 8 hours.  ? BUN 12 6 - 20 mg/dL  ? Creatinine, Ser 0.76 0.44 - 1.00 mg/dL  ? Calcium 9.1 8.9 - 10.3 mg/dL  ? Total Protein 7.9 6.5 - 8.1 g/dL  ? Albumin 4.1 3.5 - 5.0 g/dL  ? AST 18 15 - 41 U/L  ? ALT 16 0 - 44 U/L  ? Alkaline Phosphatase 59 38 - 126 U/L  ? Total Bilirubin 1.1 0.3 - 1.2 mg/dL  ? GFR, Estimated >60 >60 mL/min  ?  Comment: (NOTE) ?Calculated using the CKD-EPI Creatinine Equation (2021) ?  ? Anion gap 7 5 - 15  ?  Comment: Performed at [facility], [address], [place], [place] [address]  ?CBC with Differential/Platelet     Status: None  ? Collection Time: 06/08/21  9:34 AM  ?Result Value Ref Range  ? WBC 4.2 4.0 - 10.5 K/uL  ? RBC 4.24 3.87 - 5.11 MIL/uL  ? Hemoglobin 13.6 12.0 - 15.0 g/dL  ? HCT 39.3 36.0 - 46.0 %  ? MCV 92.7 80.0 - 100.0 fL  ? MCH 32.1 26.0 - 34.0 pg  ? MCHC 34.6 30.0 - 36.0 g/dL  ? RDW 11.8 11.5 - 15.5 %  ? Platelets 246 150 - 400 K/uL  ? nRBC 0.0 0.0 - 0.2 %  ? Neutrophils Relative % 56 %  ? Neutro Abs 2.3 1.7 - 7.7 K/uL  ? Lymphocytes Relative 30 %  ? Lymphs Abs 1.3 0.7 - 4.0 K/uL  ? Monocytes Relative 6 %  ? Monocytes Absolute 0.3 0.1 - 1.0 K/uL  ? Eosinophils Relative 7 %  ? Eosinophils Absolute 0.3 0.0 - 0.5 K/uL  ? Basophils Relative 1 %  ? Basophils Absolute 0.0 0.0 - 0.1 K/uL  ? Immature Granulocytes 0 %  ? Abs Immature Granulocytes 0.01 0.00 - 0.07 K/uL  ?  Comment: Performed at [facility], [address] [place], [place] [address]  ?BCR-ABL1, CML/ALL, PCR, QUANT     Status: None  ? Collection Time: 06/08/21  9:34 AM  ?Result Value Ref Range  ? b2a2 transcript Comment %  ?  Comment:            <0.0032 %  ? b3a2 transcript Comment %  ?  Comment:            <0.0032 %  ? E1A2 Transcript Comment %  ?  Comment:            <0.0032 %  ? Interpretation  [facility]): Negative   ?  Comment: (NOTE) ?NEGATIVE for the BCR-ABL1 e1a2 (p190), e13a2 (b2a2, p210) and e14a2 ?(b3a2, p210) fusion transcripts. These results do not rule out the ?presence of rare BCR-ABL1 transcripts not

## 2021-07-12 NOTE — Patient Instructions (Addendum)
Address: [address], [place], [place] [address] ?Hours:  ?Open ? Closes 5?PM ?Phone: [phone] ?Call for mammogram  ?[place] ? ?Consider colonoscopy [facility] ?Dr. Leafy Ro ? ?Phone Fax E-mail Address  ?[phone] Not available [place]  ? [place] [address]  ?   ?Specialties     ? ? ?[facility]  ?Dr Tol[place]  ? ?MD Physician  ? ?Primary Contact Information ? ?Phone Fax E-mail Address  ?[phone] Not available [place]  ? [place] [address]  ? ?Colonoscopy, Adult ?A colonoscopy is a procedure to look at the entire large intestine. This procedure is done using a long, thin, flexible tube that has a camera on the end. ?You may have a colonoscopy: ?As a part of normal colorectal screening. ?If you have certain symptoms, such as: ?A low number of red blood cells in your blood (anemia). ?Diarrhea that does not go away. ?Pain in your abdomen. ?Blood in your stool. ?A colonoscopy can help screen for and diagnose medical problems, including: ?An abnormal growth of cells or tissue (tumor). ?Abnormal growths within the lining of your intestine (polyps). ?Inflammation. ?Areas of bleeding. ?Tell your health care provider about: ?Any allergies you have. ?All medicines you are taking, including vitamins, herbs, eye drops, creams, and over-the-counter medicines. ?Any problems you or family members have had with anesthetic medicines. ?Any bleeding problems you have. ?Any surgeries you have had. ?Any medical conditions you have. ?Any problems you have had with having bowel movements. ?Whether you are pregnant or may be pregnant. ?What are the risks? ?Generally, this is a safe procedure. However, problems may occur, including: ?Bleeding. ?Damage to your intestine. ?Allergic reactions to medicines given during the procedure. ?Infection. This is rare. ?What happens before the procedure? ?Eating and drinking restrictions ?Follow  instructions from your health care provider about eating or drinking restrictions, which may include: ?A few days before the procedure: ?Follow a low-fiber diet. ?Avoid nuts, seeds, dried fruit, raw fruits, and vegetables. ?1-3 days before the procedure: ?Eat only gelatin dessert or ice pops. ?Drink only clear liquids, such as water, clear juice, clear broth or bouillon, black coffee or tea, or clear soft drinks or sports drinks. ?Avoid liquids that contain red or purple dye. ?The day of the procedure: ?Do not eat solid foods. You may continue to drink clear liquids until up to 2 hours before the procedure. ?Do not eat or drink anything starting 2 hours before the procedure, or within the time period that your health care provider recommends. ?Bowel prep ?If you were prescribed a bowel prep to take by mouth (orally) to clean out your colon: ?Take it as told by your health care provider. Starting the day before your procedure, you will need to drink a large amount of liquid medicine. The liquid will cause you to have many bowel movements of loose stool until your stool becomes almost clear or light green. ?If your skin or the opening between the buttocks (anus) gets irritated from diarrhea, you may relieve the irritation using: ?Wipes with medicine in them, such as adult wet wipes with aloe and vitamin E. ?A product to soothe skin, such as petroleum jelly. ?If you vomit while drinking the bowel prep: ?Take a break for up to 60 minutes. ?Begin the bowel prep again. ?Call your health care provider if you keep vomiting or you cannot take the bowel prep without vomiting. ?To clean out your colon, you may also be given: ?Laxative  medicines. These help you have a bowel movement. ?Instructions for enema use. An enema is liquid medicine injected into your rectum. ?Medicines ?Ask your health care provider about: ?Changing or stopping your regular medicines or supplements. This is especially important if you are taking iron  supplements, diabetes medicines, or blood thinners. ?Taking medicines such as aspirin and ibuprofen. These medicines can thin your blood. Do not take these medicines unless your health care provider tells you to take them. ?Taking over-the-counter medicines, vitamins, herbs, and supplements. ?General instructions ?Ask your health care provider what steps will be taken to help prevent infection. These may include washing skin with a germ-killing soap. ?If you will be going home right after the procedure, plan to have a responsible adult: ?Take you home from the hospital or clinic. You will not be allowed to drive. ?Care for you for the time you are told. ?What happens during the procedure? ? ?An IV will be inserted into one of your veins. ?You will be given a medicine to make you fall asleep (general anesthetic). ?You will lie on your side with your knees bent. ?A lubricant will be put on the tube. Then the tube will be: ?Inserted into your anus. ?Gently eased through all parts of your large intestine. ?Air will be sent into your colon to keep it open. This may cause some pressure or cramping. ?Images will be taken with the camera and will appear on a screen. ?A small tissue sample may be removed to be looked at under a microscope (biopsy). The tissue may be sent to a lab for testing if any signs of problems are found. ?If small polyps are found, they may be removed and checked for cancer cells. ?When the procedure is finished, the tube will be removed. ?The procedure may vary among health care providers and hospitals. ?What happens after the procedure? ?Your blood pressure, heart rate, breathing rate, and blood oxygen level will be monitored until you leave the hospital or clinic. ?You may have a small amount of blood in your stool. ?You may pass gas and have mild cramping or bloating in your abdomen. This is caused by the air that was used to open your colon during the exam. ?If you were given a sedative during the  procedure, it can affect you for several hours. Do not drive or operate machinery until your health care provider says that it is safe. ?It is up to you to get the results of your procedure. Ask your health care provider, or the department that is doing the procedure, when your results will be ready. ?Summary ?A colonoscopy is a procedure to look at the entire large intestine. ?Follow instructions from your health care provider about eating and drinking before the procedure. ?If you were prescribed an oral bowel prep to clean out your colon, take it as told by your health care provider. ?During the colonoscopy, a flexible tube with a camera on its end is inserted into the anus and then passed into all parts of the large intestine. ?This information is not intended to replace advice given to you by your health care provider. Make sure you discuss any questions you have with your health care provider. ?Document Revised: 03/05/2021 Document Reviewed: 11/01/2020 ?Elsevier Patient Education ? [employer]. ? ?

## 2021-07-13 LAB — URINALYSIS, ROUTINE W REFLEX MICROSCOPIC
Bilirubin Urine: NEGATIVE
Glucose, UA: NEGATIVE
Hgb urine dipstick: NEGATIVE
Ketones, ur: NEGATIVE
Leukocytes,Ua: NEGATIVE
Nitrite: NEGATIVE
Protein, ur: NEGATIVE
Specific Gravity, Urine: 1.017 (ref 1.001–1.035)
pH: 7.5 (ref 5.0–8.0)

## 2021-07-16 ENCOUNTER — Encounter: Payer: Self-pay | Admitting: Internal Medicine

## 2021-07-17 ENCOUNTER — Other Ambulatory Visit: Payer: Self-pay | Admitting: Internal Medicine

## 2021-07-17 DIAGNOSIS — B354 Tinea corporis: Secondary | ICD-10-CM

## 2021-07-17 MED ORDER — HYDROCORTISONE 2.5 % EX CREA: TOPICAL_CREAM | Freq: Two times a day (BID) | CUTANEOUS | 11 refills | Status: DC

## 2021-07-17 MED ORDER — KETOCONAZOLE 2 % EX CREA: TOPICAL_CREAM | CUTANEOUS | 11 refills | Status: DC

## 2021-07-20 ENCOUNTER — Ambulatory Visit: Payer: BC Managed Care – PPO

## 2021-08-16 ENCOUNTER — Encounter: Payer: Self-pay | Admitting: Internal Medicine

## 2021-08-24 DIAGNOSIS — H1013 Acute atopic conjunctivitis, bilateral: Secondary | ICD-10-CM | POA: Diagnosis not present

## 2021-09-27 ENCOUNTER — Inpatient Hospital Stay: Payer: BC Managed Care – PPO | Attending: Oncology

## 2021-09-27 DIAGNOSIS — H1013 Acute atopic conjunctivitis, bilateral: Secondary | ICD-10-CM | POA: Diagnosis not present

## 2021-09-27 DIAGNOSIS — C9211 Chronic myeloid leukemia, BCR/ABL-positive, in remission: Secondary | ICD-10-CM | POA: Insufficient documentation

## 2021-09-27 DIAGNOSIS — C9210 Chronic myeloid leukemia, BCR/ABL-positive, not having achieved remission: Secondary | ICD-10-CM

## 2021-09-27 LAB — CBC WITH DIFFERENTIAL/PLATELET
Abs Immature Granulocytes: 0.02 10*3/uL (ref 0.00–0.07)
Basophils Absolute: 0 10*3/uL (ref 0.0–0.1)
Basophils Relative: 1 %
Eosinophils Absolute: 0.4 10*3/uL (ref 0.0–0.5)
Eosinophils Relative: 7 %
HCT: 37.4 % (ref 36.0–46.0)
Hemoglobin: 12.9 g/dL (ref 12.0–15.0)
Immature Granulocytes: 0 %
Lymphocytes Relative: 31 %
Lymphs Abs: 1.7 10*3/uL (ref 0.7–4.0)
MCH: 31.9 pg (ref 26.0–34.0)
MCHC: 34.5 g/dL (ref 30.0–36.0)
MCV: 92.3 fL (ref 80.0–100.0)
Monocytes Absolute: 0.3 10*3/uL (ref 0.1–1.0)
Monocytes Relative: 6 %
Neutro Abs: 3 10*3/uL (ref 1.7–7.7)
Neutrophils Relative %: 55 %
Platelets: 255 10*3/uL (ref 150–400)
RBC: 4.05 MIL/uL (ref 3.87–5.11)
RDW: 11.9 % (ref 11.5–15.5)
WBC: 5.5 10*3/uL (ref 4.0–10.5)
nRBC: 0 % (ref 0.0–0.2)

## 2021-09-27 LAB — COMPREHENSIVE METABOLIC PANEL
ALT: 15 U/L (ref 0–44)
AST: 17 U/L (ref 15–41)
Albumin: 4 g/dL (ref 3.5–5.0)
Alkaline Phosphatase: 59 U/L (ref 38–126)
Anion gap: 7 (ref 5–15)
BUN: 11 mg/dL (ref 6–20)
CO2: 28 mmol/L (ref 22–32)
Calcium: 8.8 mg/dL — ABNORMAL LOW (ref 8.9–10.3)
Chloride: 99 mmol/L (ref 98–111)
Creatinine, Ser: 0.73 mg/dL (ref 0.44–1.00)
GFR, Estimated: >60 mL/min (ref >60)
Glucose, Bld: 119 mg/dL — ABNORMAL HIGH (ref 70–99)
Potassium: 3.5 mmol/L (ref 3.5–5.1)
Sodium: 134 mmol/L — ABNORMAL LOW (ref 135–145)
Total Bilirubin: 0.9 mg/dL (ref 0.3–1.2)
Total Protein: 7.9 g/dL (ref 6.5–8.1)

## 2021-10-01 LAB — BCR-ABL1 FISH
Cells Analyzed: 200
Cells Counted: 200

## 2021-10-03 LAB — BCR-ABL1, CML/ALL, PCR, QUANT: Interpretation (BCRAL):: NEGATIVE

## 2021-10-11 ENCOUNTER — Encounter: Payer: Self-pay | Admitting: Oncology

## 2021-10-11 ENCOUNTER — Inpatient Hospital Stay: Payer: BC Managed Care – PPO | Admitting: Oncology

## 2021-10-11 VITALS — BP 119/82 | HR 70 | Temp 97.1°F | Resp 16 | Wt 191.7 lb

## 2021-10-11 DIAGNOSIS — C9210 Chronic myeloid leukemia, BCR/ABL-positive, not having achieved remission: Secondary | ICD-10-CM | POA: Diagnosis not present

## 2021-10-11 DIAGNOSIS — C9211 Chronic myeloid leukemia, BCR/ABL-positive, in remission: Secondary | ICD-10-CM | POA: Diagnosis not present

## 2021-10-11 DIAGNOSIS — Z5111 Encounter for antineoplastic chemotherapy: Secondary | ICD-10-CM | POA: Diagnosis not present

## 2021-10-11 MED ORDER — NILOTINIB HCL 150 MG PO CAPS: ORAL_CAPSULE | ORAL | 2 refills | Status: DC

## 2021-10-11 NOTE — Progress Notes (Signed)
Pt has no new concerns for todays visit. 

## 2021-10-11 NOTE — Progress Notes (Signed)
Hematology/Oncology follow up note [phone]    Patient Care Team: McLean-Scocuzza, Nino Glow, MD as PCP - General (Internal Medicine)   CHIEF COMPLAINTS/PURPOSE OF VISIT:  Management of CML  HISTORY OF PRESENTING ILLNESS:  Jennifer English is a  56 y.o.  female with PMH listed below who was referred to by her oncologist in [place]. [place] to [place] this summer. She has not had local pcp yet. Extensive medical records review was performed.  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 She had dyuria symptoms at that time and lab work up found extreme leukocytosis with WBC 173,500 and she was initially started on hydrea. After CML was confirmed, she was started on Tasigna '300mg'$  BID since then and has achieved remission. . Also had splenomegaly at presentation about 5-6 cm below LCM, resolved. . She reports being complaint on nilotinib however, she has had period of time when her medication runs out. Although she moved this summer, due to lot of stresses associated with relocation and new job, she waited until now see hematologist.  Denies any fever, chill, weight loss, bleeding events. She used to have EKG done every 3 months and faxed to oncologist. She has not had any EKG done after July this year. No PCP Also reports recently feeling urine has odor and also very mild burning sensation when passing urine. She requests to have urine tested.  Has hypothyroidism, not taking thyroid supplements.  History of iron deficiency due to heavy menstrual periods/uterine fibroids, taking iron supplements.  # 10/09/2012, bone marrow aspirate smears and clots: Markedly hypercellular marrow with prominent myeloid hyperplasia and increasing small hypolobated megakaryocytes. The overall findings are consistent with chronic myeloid leukemia, chronic phase. # 03/22/2015 BCR ABL quantification PCR showed e13a2 (b2a2 p210) transcript <0.001%,e14a2 ( b3a2, p210)  transcript 0.023%,  e1a2 (p190) transcript <0.001% # 08/17/2015 BCR ABL quantification PCR showed e13a2 (b2a2 p210) transcript 0.011%,e14a2 ( b3a2, p210)  transcript <0.001%, e1a2 (p190) transcript <0.001%  # 04/25/2016 BCR ABL quantification PCR showed e13a2 (b2a2 p210) transcript <0.001%,e14a2 ( b3a2, p210)  transcript <0.001%, e1a2 (p190) transcript <0.001%  #09/17/2016  BCR ABL quantification PCR showed e13a2 (b2a2 p210) transcript <0.0032%,e14a2 ( b3a2, p210)  transcript <0.0032%, e1a2 (p190) transcript <0.0032%   02/19/2017 BCR ABL quantification PCR showed b2a2 transcript <0.0032%, b3a2 transcript <0.0032%, E1A2 transcript <0.0032%  cardiology Dr.End was seen by Dr. Velva Harman for abnormal EKG.  In Dr. Tyrell Antonio office, her EKG was normal.  Not consistent with septal infarct.  Early May 2022 patient has had COVID infection.   08/29/2020, BCR ABL 1 FISH negative, BCR ABL 1 quantification PCR positive for BCR-ABL1 e13a2 (b2a2, p210) and e14a2 (b3a2, p210), IS 7.3220. Patient was recommended to repeat testing which was done on 10/03/2020. 10/03/2020, BCR ABL 1 FISH negative, qualification PCR all negative, achieved MR again.        INTERVAL HISTORY Jennifer English is a 56 y.o. female who has above history reviewed by me today presents for follow up of CML.  She feels well, except that due to stress from her work, she feels tired yesterday.  No other new complaints.   Review of Systems  Constitutional:  Negative for appetite change, chills, fatigue and fever.  HENT:   Negative for hearing loss and voice change.   Eyes:  Negative for eye problems.  Respiratory:  Negative for chest tightness and cough.   Cardiovascular:  Negative for chest pain.  Gastrointestinal:  Negative for abdominal distention,  abdominal pain and blood in stool.  Endocrine: Negative for hot flashes.  Genitourinary:  Negative for difficulty urinating and frequency.   Musculoskeletal:  Negative for arthralgias.  Skin:  Negative for itching and  rash.  Neurological:  Negative for extremity weakness.  Hematological:  Negative for adenopathy.  Psychiatric/Behavioral:  Negative for confusion.    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     SURGICAL HISTORY: Past Surgical History:  Procedure Laterality Date   CESAREAN SECTION     x 1 2003    CYSTECTOMY     middle finger right hand    OTHER SURGICAL HISTORY     cyst removed from finger   TONSILECTOMY, ADENOIDECTOMY, BILATERAL MYRINGOTOMY AND TUBES     TYMPANOSTOMY TUBE PLACEMENT     1978    SOCIAL HISTORY: Social History   Socioeconomic History   Marital status: Married    Spouse name: Not on file   Number of children: Not on file   Years of education: Not on file   Highest education level: Not on file  Occupational History   Not on file  Tobacco Use   Smoking status: Never   Smokeless tobacco: Never  Vaping Use   Vaping Use: Never used  Substance and Sexual Activity   Alcohol use: No   Drug use: No   Sexual activity: Yes    Birth control/protection: None  Other Topics Concern   Not on file  Social History Narrative   Married lives with daughter husband and mother in law   1 daughter    Works Engineer, [employer]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   [place] from [place] 2018    Social Determinants of Health   Financial Resource Strain: Not on file  Food Insecurity: Not on file  Transportation Needs: Not on file  Physical Activity: Not on file  Stress: Not on file  Social Connections: Not on file  Intimate Partner Violence: Not on file    FAMILY  HISTORY: Family History  Problem Relation Age of Onset   Hyperthyroidism Mother    Heart disease Father        CAD with stents    Hyperlipidemia Father    Hypertension Father    Dementia Maternal Grandfather    Melanoma Paternal Grandmother    Bladder Cancer Paternal Grandmother    Cancer Paternal Grandmother        lung/colon ?primary    Arthritis Paternal Grandmother    Bladder Cancer Paternal Grandfather    Cancer Paternal Grandfather        bladder died in 43s    Early death Paternal Grandfather    Alcohol abuse Maternal Grandmother    Hepatitis Maternal Grandmother    Early death Maternal Grandmother    Kidney disease Maternal Grandmother    Macular degeneration Maternal Grandmother    Liver disease Maternal Grandmother        ?alcoholic hepatitis  Hyperthyroidism Sister    Asthma Brother    Asthma Brother    Macular degeneration Maternal Aunt    Breast cancer Neg Hx     ALLERGIES:  is allergic to ibuprofen, levoxyl [levothyroxine sodium], synthroid [levothyroxine], aspirin, avocado, eggs or egg-derived products, and penicillins.  MEDICATIONS:  Current Outpatient Medications  Medication Sig Dispense Refill   hydrocortisone 2.5 % cream Apply topically 2 (two) times daily. Right back 30 g 11   ketoconazole (NIZORAL) 2 % cream Use 2-3 x per day x 1-3 weeks 60 g 11   levothyroxine (SYNTHROID) 25 MCG tablet Take 1.5 tablets (37.5 mcg total) by mouth daily before breakfast. 140 tablet 3   fexofenadine (ALLEGRA) 180 MG tablet Take 180 mg by mouth daily as needed for allergies or rhinitis. (Patient not taking: Reported on 07/12/2021)     neomycin-polymyxin b-dexamethasone (MAXITROL) 3.5-10000-0.1 SUSP 1 drop 3 (three) times daily. (Patient not taking: Reported on 10/11/2021)     nilotinib (TASIGNA) 150 MG capsule TAKE 2 CAPSULES ('300MG'$ ) BY  MOUTH TWICE DAILY (12 HOURS APART) ON AN EMPTY STOMACH  1 HOUR BEFORE OR 2 HOURS  AFTER A MEAL 120 capsule 2   No current  facility-administered medications for this visit.     PHYSICAL EXAMINATION: ECOG PERFORMANCE STATUS: 0 - Asymptomatic Vitals:   10/11/21 1306  BP: 119/82  Pulse: 70  Resp: 16  Temp: (!) 97.1 F (36.2 C)  SpO2: 100%   Filed Weights   10/11/21 1306  Weight: 191 lb 11.2 oz (87 kg)   Physical Exam Constitutional:      General: She is not in acute distress.    Appearance: She is not diaphoretic.  HENT:     Head: Normocephalic and atraumatic.     Nose: Nose normal.     Mouth/Throat:     Pharynx: No oropharyngeal exudate.  Eyes:     General: No scleral icterus.    Pupils: Pupils are equal, round, and reactive to light.  Cardiovascular:     Rate and Rhythm: Normal rate and regular rhythm.     Heart sounds: No murmur heard. Pulmonary:     Effort: Pulmonary effort is normal. No respiratory distress.     Breath sounds: No rales.  Chest:     Chest wall: No tenderness.  Abdominal:     General: There is no distension.     Palpations: Abdomen is soft.     Tenderness: There is no abdominal tenderness.  Musculoskeletal:        General: Normal range of motion.     Cervical back: Normal range of motion and neck supple.  Skin:    General: Skin is warm and dry.     Findings: No erythema.  Neurological:     Mental Status: She is alert and oriented to person, place, and time.     Cranial Nerves: No cranial nerve deficit.     Motor: No abnormal muscle tone.     Coordination: Coordination normal.  Psychiatric:        Mood and Affect: Affect normal.    LABORATORY DATA:  I have reviewed the data as listed    Latest Ref Rng & Units 09/27/2021    1:04 PM 06/08/2021    9:34 AM 01/17/2021   11:36 AM  CBC  WBC 4.0 - 10.5 K/uL 5.5  4.2  4.4   Hemoglobin 12.0 - 15.0 g/dL 12.9  13.6  13.4   Hematocrit 36.0 - 46.0 % 37.4  39.3  39.5   Platelets 150 - 400 K/uL 255  246  238       Latest Ref Rng & Units 09/27/2021    1:04 PM 06/08/2021    9:34 AM 01/17/2021   11:36 AM  CMP  Glucose  70 - 99 mg/dL 119  103  97   BUN 6 - 20 mg/dL '11  12  10   '$ Creatinine 0.44 - 1.00 mg/dL 0.73  0.76  0.69   Sodium 135 - 145 mmol/L 134  136  134   Potassium 3.5 - 5.1 mmol/L 3.5  4.1  3.7   Chloride 98 - 111 mmol/L 99  102  100   CO2 22 - 32 mmol/L '28  27  28   '$ Calcium 8.9 - 10.3 mg/dL 8.8  9.1  8.8   Total Protein 6.5 - 8.1 g/dL 7.9  7.9  7.8   Total Bilirubin 0.3 - 1.2 mg/dL 0.9  1.1  1.0   Alkaline Phos 38 - 126 U/L 59  59  54   AST 15 - 41 U/L '17  18  18   '$ ALT 0 - 44 U/L '15  16  17        '$ ASSESSMENT & PLAN:  1. Encounter for antineoplastic chemotherapy   2. CML (chronic myelocytic leukemia) ([place])    #CML, in molecular remission Labs reviewed and discussed with patient Patient is in molecular remission- MR 4 Conitnue  Tasigna 300 mg twice daily.  Return of visit: Follow-up in 3 months  [name], MD, PhD 10/11/2021

## 2021-10-13 IMAGING — MG MM DIGITAL DIAGNOSTIC UNILAT*R* W/ TOMO W/ CAD
6 series · 6 of 18 positions shown · non-contrast
Comparison: Previous exam(s).

CLINICAL DATA: 54-year-old female recalled from baseline screening
mammogram dated 02/09/2020 for a possible right breast mass.

EXAM:
DIGITAL DIAGNOSTIC RIGHT MAMMOGRAM WITH CAD AND TOMO
ULTRASOUND RIGHT BREAST

[R ML synth-2D]
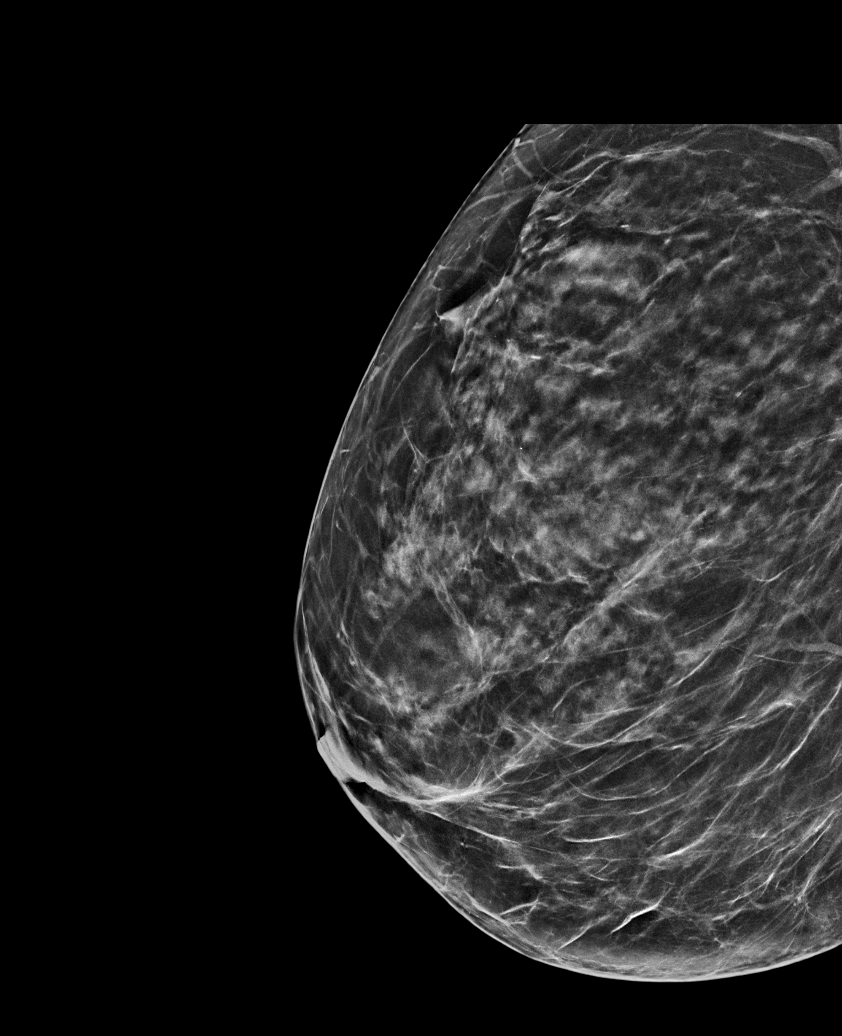

[R MLO synth-2D]
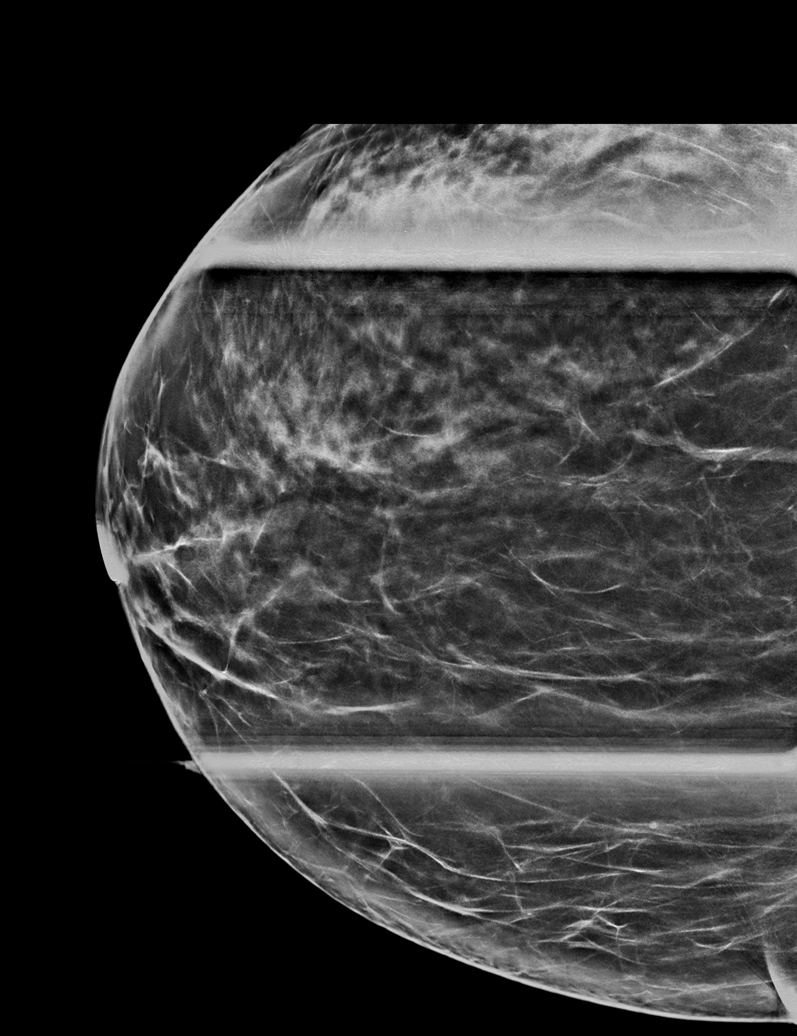

[R CC synth-2D]
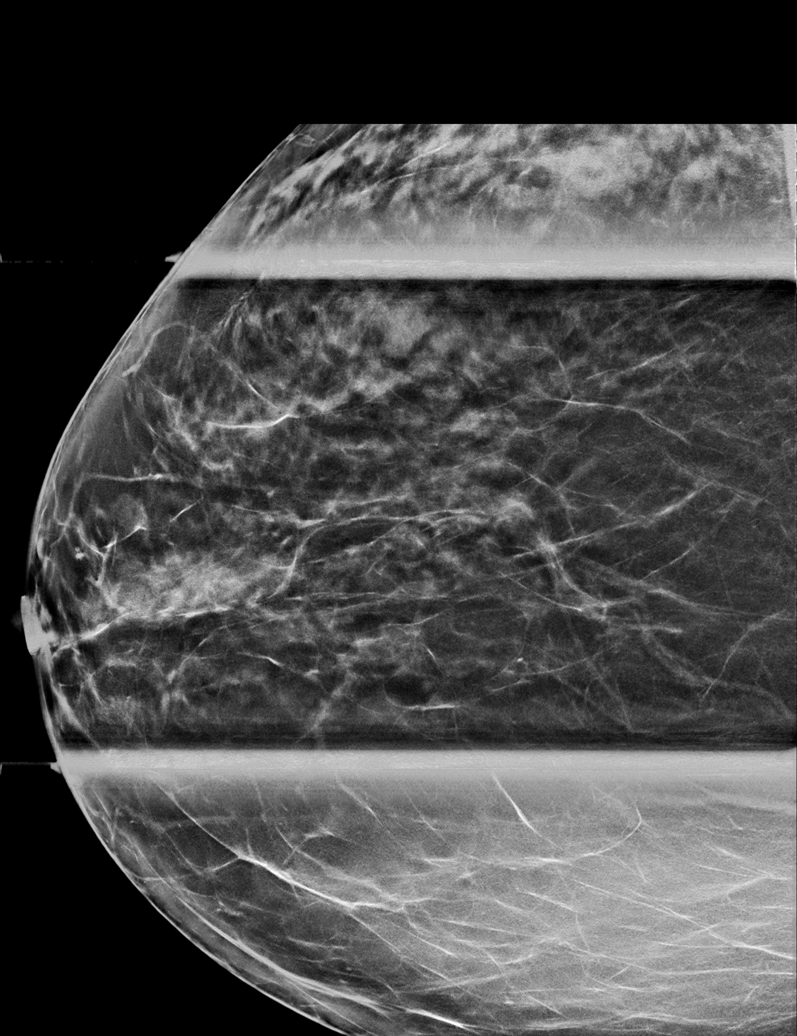

[R ML tomo · tomo slice 38/75.0]
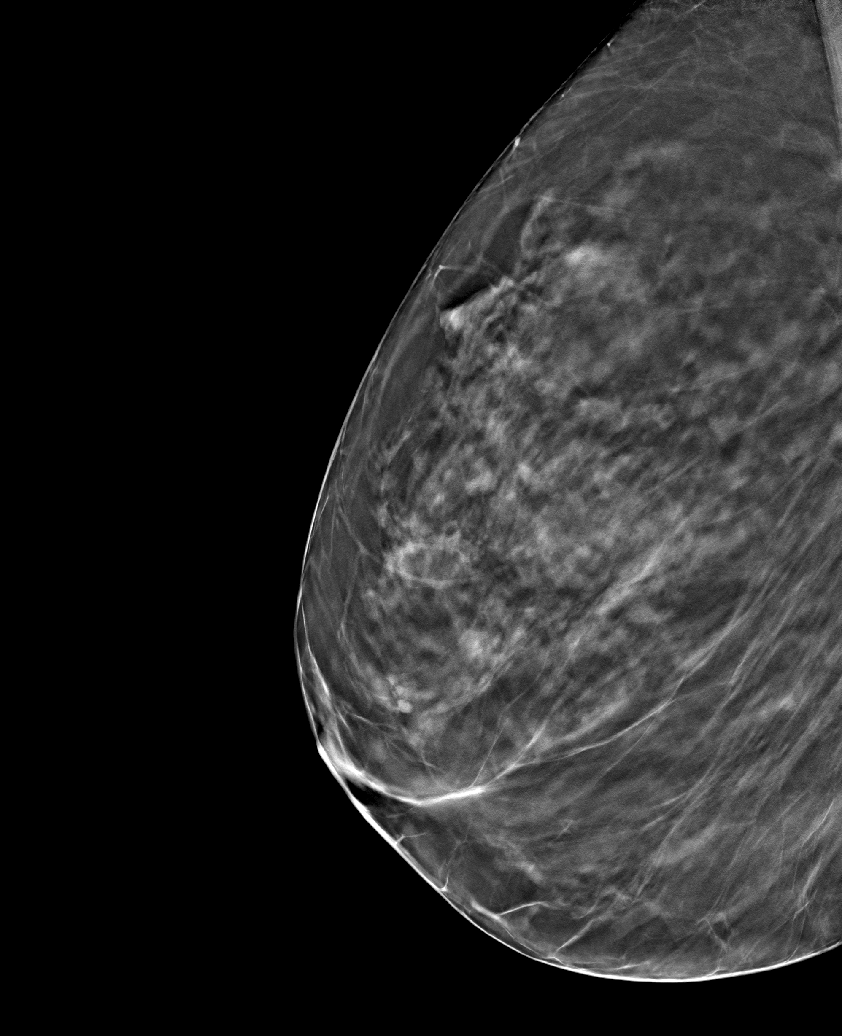

[R MLO tomo · tomo slice 39/76.0]
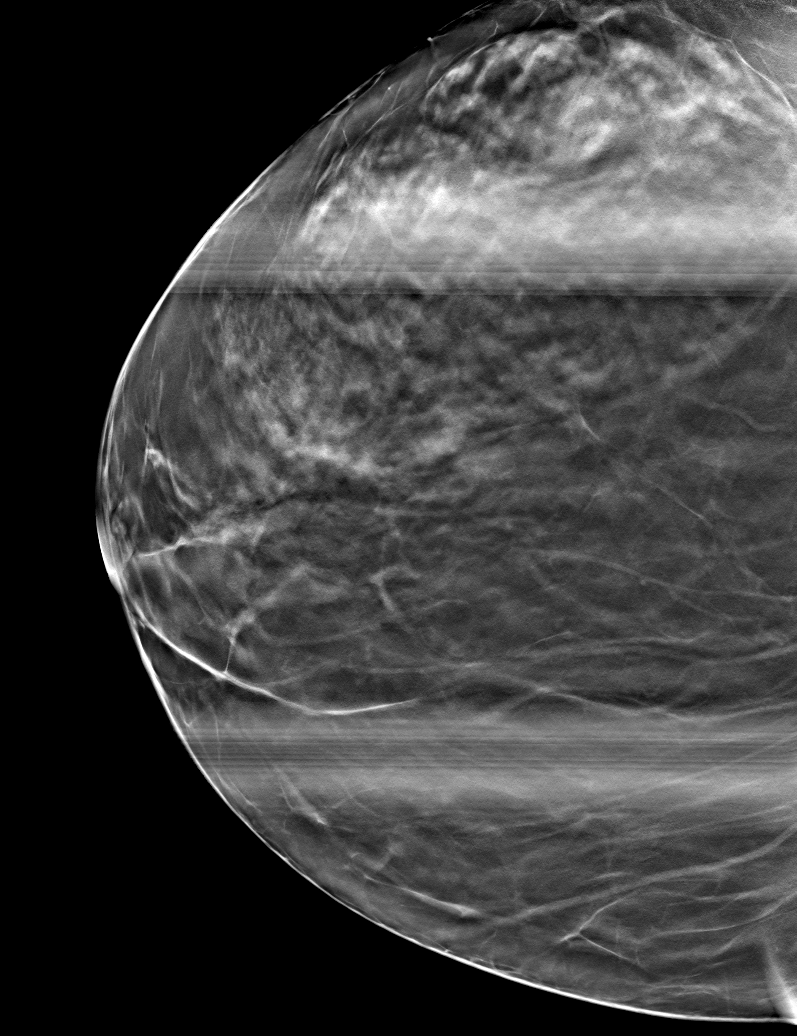

[R CC tomo · tomo slice 33/64.0]
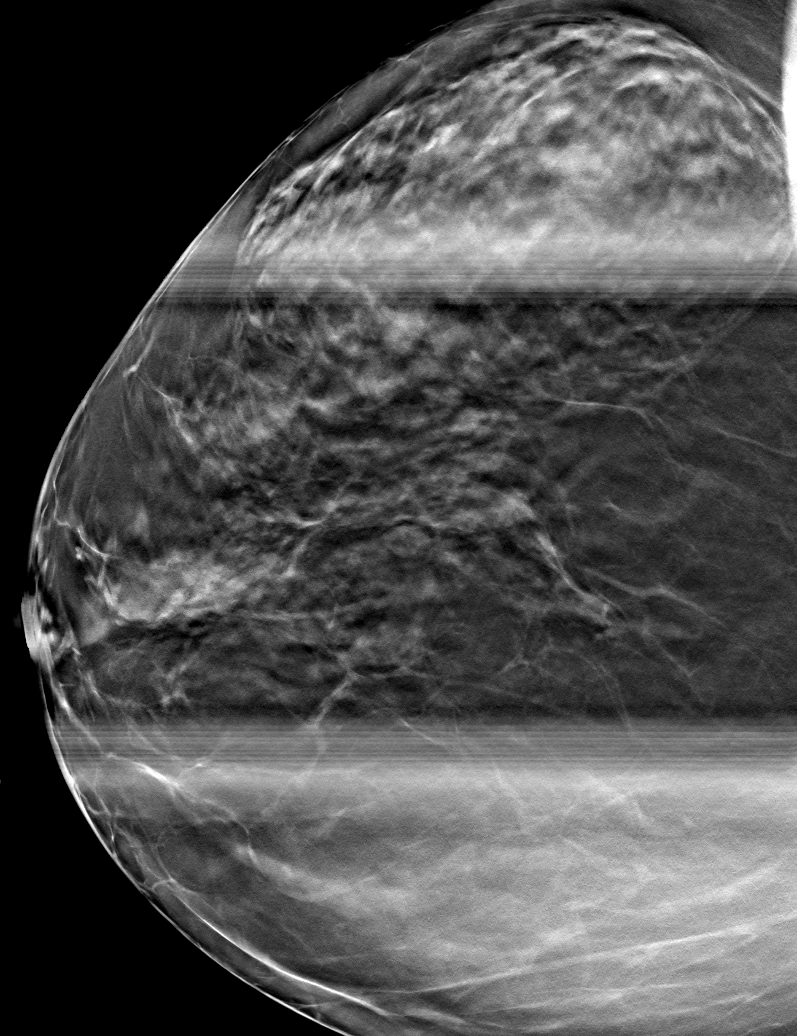

[6 of 18 positions shown; findings below may reference images not displayed]

ACR Breast Density Category c: The breast tissue is heterogeneously
dense, which may obscure small masses.
FINDINGS: There is a persistent oval, partially circumscribed partially
obscured mass in the central right breast at middle depth. There is
suggestion of central lucency which may represent a fatty hilum. No
additional suspicious findings identified. Further evaluation with
ultrasound was performed.

Mammographic images were processed with CAD.

Targeted ultrasound is performed, showing a morphologically normal
intramammary lymph node at the 11 o'clock position 1 cm from the
nipple. No additional suspicious findings were identified in the
right breast. This may correspond with the screening mammographic
findings.
IMPRESSION: Probably benign right breast mass which may correspond with a
morphologically normal intramammary lymph node. As a precaution,
short-term imaging follow-up is recommended.

RECOMMENDATION:
Diagnostic right breast mammogram and possible ultrasound in 6
months.

I have discussed the findings and recommendations with the patient.
If applicable, a reminder letter will be sent to the patient
regarding the next appointment.

BI-RADS CATEGORY  3: Probably benign.

## 2021-10-13 IMAGING — US US BREAST*R* LIMITED INC AXILLA
1 series · 5 of 5 positions shown · non-contrast
Comparison: Previous exam(s).

CLINICAL DATA: 54-year-old female recalled from baseline screening
mammogram dated 02/09/2020 for a possible right breast mass.

EXAM:
DIGITAL DIAGNOSTIC RIGHT MAMMOGRAM WITH CAD AND TOMO
ULTRASOUND RIGHT BREAST

[Series 1: us breast*right* limited inc axilla · 0.06mm/px · 5 of 5 slices shown]
[im 1/5]
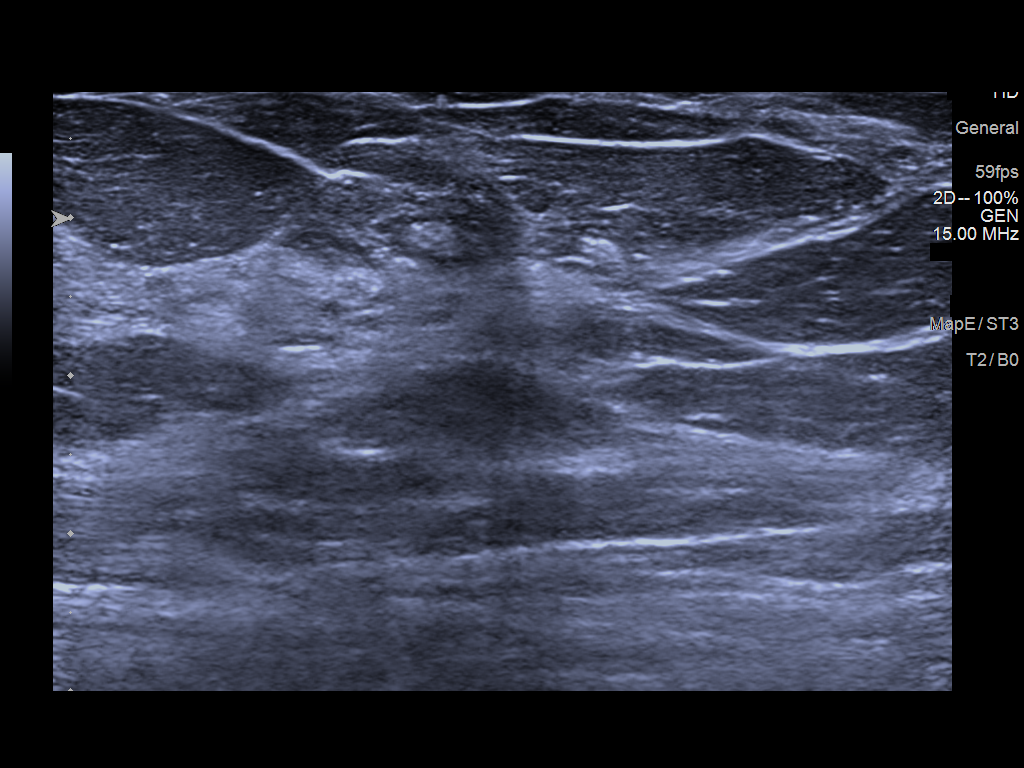
[im 2/5]
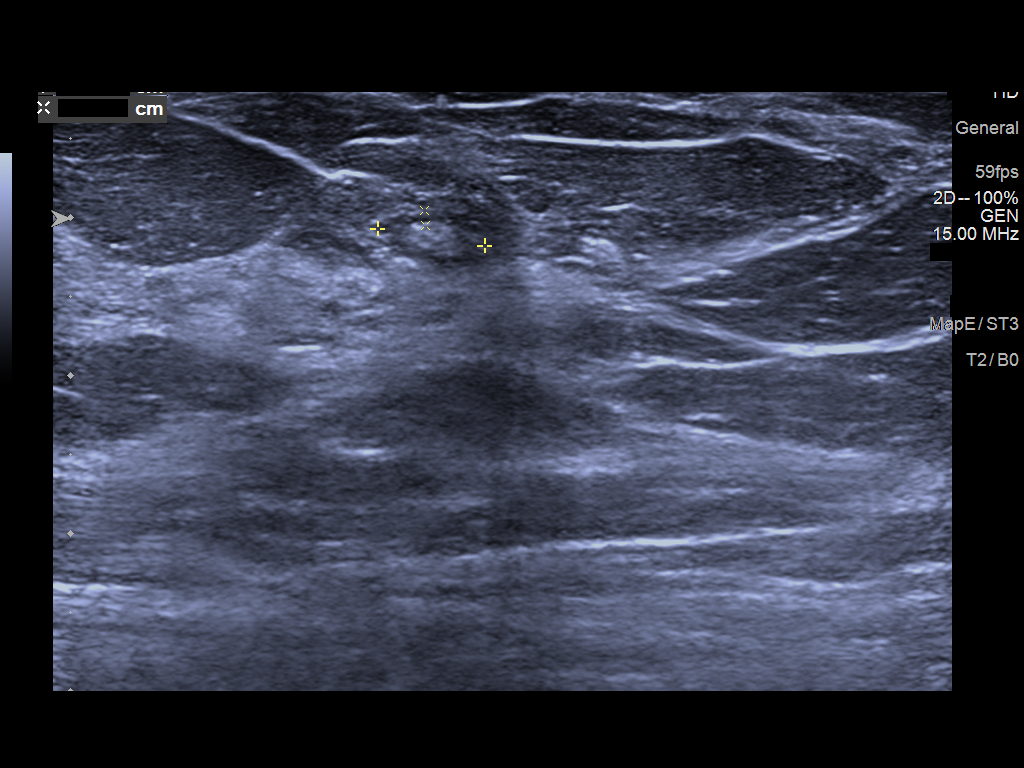
[im 3/5]
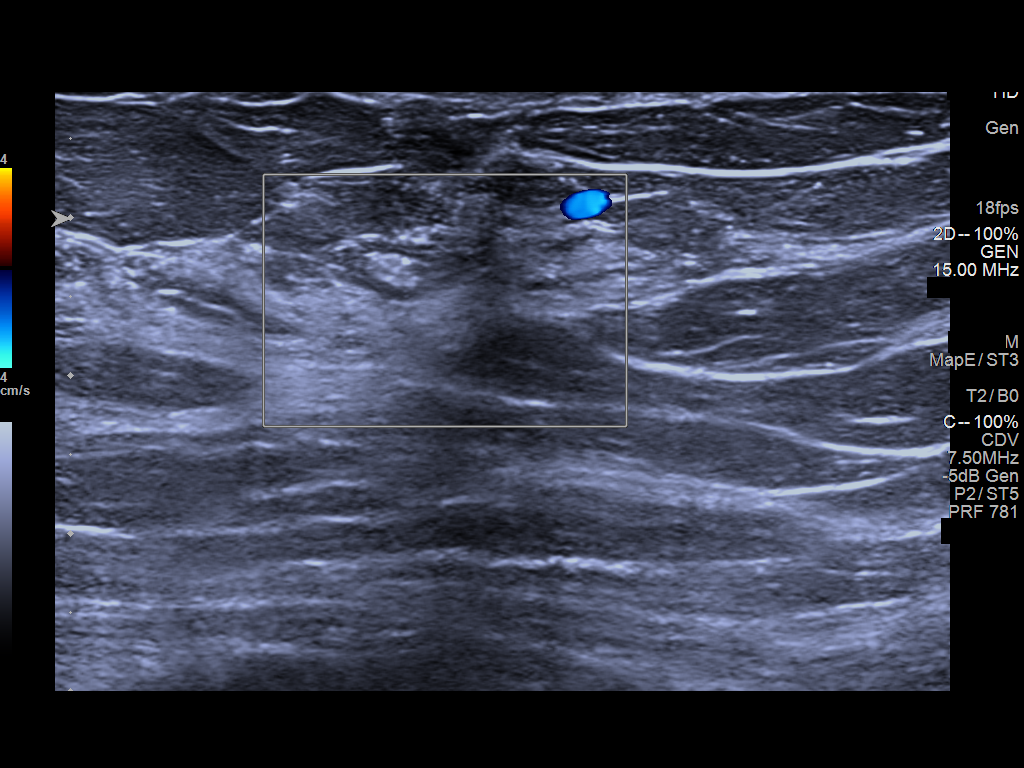
[im 4/5]
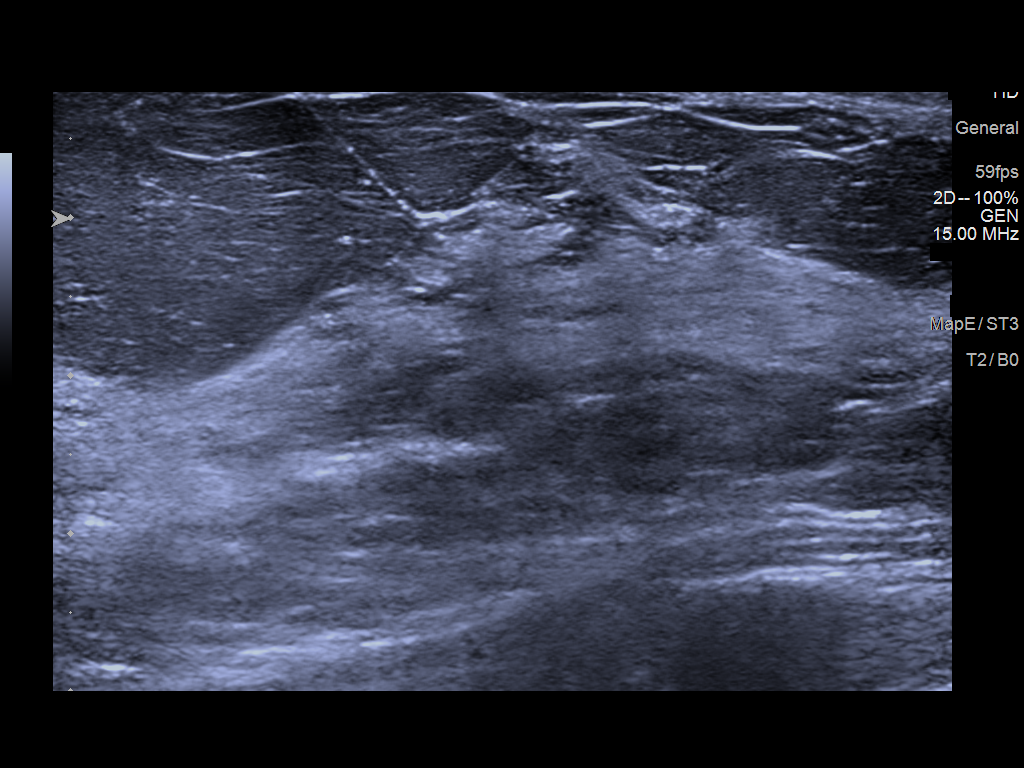
[im 5/5]
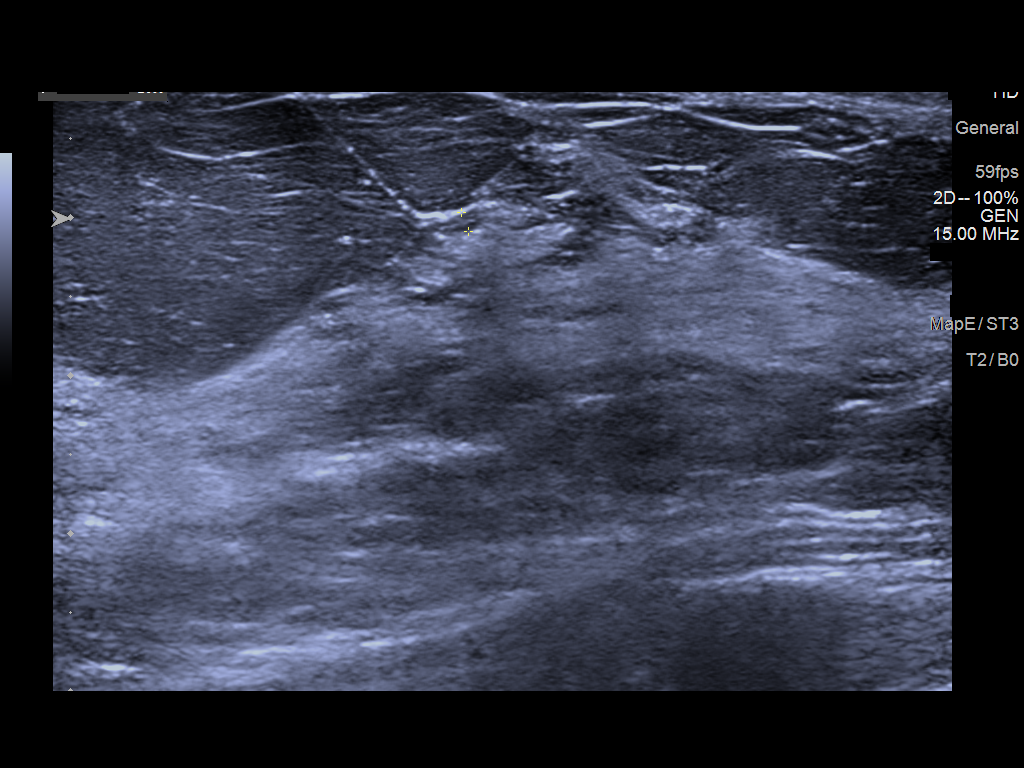

[5 of 5 positions shown; findings below may reference images not displayed]

ACR Breast Density Category c: The breast tissue is heterogeneously
dense, which may obscure small masses.
FINDINGS: There is a persistent oval, partially circumscribed partially
obscured mass in the central right breast at middle depth. There is
suggestion of central lucency which may represent a fatty hilum. No
additional suspicious findings identified. Further evaluation with
ultrasound was performed.

Mammographic images were processed with CAD.

Targeted ultrasound is performed, showing a morphologically normal
intramammary lymph node at the 11 o'clock position 1 cm from the
nipple. No additional suspicious findings were identified in the
right breast. This may correspond with the screening mammographic
findings.
IMPRESSION: Probably benign right breast mass which may correspond with a
morphologically normal intramammary lymph node. As a precaution,
short-term imaging follow-up is recommended.

RECOMMENDATION:
Diagnostic right breast mammogram and possible ultrasound in 6
months.

I have discussed the findings and recommendations with the patient.
If applicable, a reminder letter will be sent to the patient
regarding the next appointment.

BI-RADS CATEGORY  3: Probably benign.

## 2021-10-25 ENCOUNTER — Encounter: Payer: Self-pay | Admitting: Internal Medicine

## 2022-01-17 ENCOUNTER — Ambulatory Visit: Payer: BC Managed Care – PPO | Admitting: Oncology

## 2022-01-17 ENCOUNTER — Inpatient Hospital Stay: Payer: BC Managed Care – PPO

## 2022-01-18 ENCOUNTER — Inpatient Hospital Stay: Payer: BC Managed Care – PPO | Attending: Oncology

## 2022-01-18 DIAGNOSIS — C9210 Chronic myeloid leukemia, BCR/ABL-positive, not having achieved remission: Secondary | ICD-10-CM | POA: Diagnosis not present

## 2022-01-18 LAB — CBC WITH DIFFERENTIAL/PLATELET
Abs Immature Granulocytes: 0.01 10*3/uL (ref 0.00–0.07)
Basophils Absolute: 0 10*3/uL (ref 0.0–0.1)
Basophils Relative: 1 %
Eosinophils Absolute: 0.3 10*3/uL (ref 0.0–0.5)
Eosinophils Relative: 7 %
HCT: 36.8 % (ref 36.0–46.0)
Hemoglobin: 12.6 g/dL (ref 12.0–15.0)
Immature Granulocytes: 0 %
Lymphocytes Relative: 34 %
Lymphs Abs: 1.4 10*3/uL (ref 0.7–4.0)
MCH: 31.9 pg (ref 26.0–34.0)
MCHC: 34.2 g/dL (ref 30.0–36.0)
MCV: 93.2 fL (ref 80.0–100.0)
Monocytes Absolute: 0.3 10*3/uL (ref 0.1–1.0)
Monocytes Relative: 8 %
Neutro Abs: 2.1 10*3/uL (ref 1.7–7.7)
Neutrophils Relative %: 50 %
Platelets: 224 10*3/uL (ref 150–400)
RBC: 3.95 MIL/uL (ref 3.87–5.11)
RDW: 11.9 % (ref 11.5–15.5)
WBC: 4.1 10*3/uL (ref 4.0–10.5)
nRBC: 0 % (ref 0.0–0.2)

## 2022-01-18 LAB — COMPREHENSIVE METABOLIC PANEL
ALT: 15 U/L (ref 0–44)
AST: 16 U/L (ref 15–41)
Albumin: 3.9 g/dL (ref 3.5–5.0)
Alkaline Phosphatase: 57 U/L (ref 38–126)
Anion gap: 7 (ref 5–15)
BUN: 12 mg/dL (ref 6–20)
CO2: 26 mmol/L (ref 22–32)
Calcium: 8.9 mg/dL (ref 8.9–10.3)
Chloride: 105 mmol/L (ref 98–111)
Creatinine, Ser: 0.67 mg/dL (ref 0.44–1.00)
GFR, Estimated: >60 mL/min (ref >60)
Glucose, Bld: 99 mg/dL (ref 70–99)
Potassium: 4 mmol/L (ref 3.5–5.1)
Sodium: 138 mmol/L (ref 135–145)
Total Bilirubin: 0.9 mg/dL (ref 0.3–1.2)
Total Protein: 7.8 g/dL (ref 6.5–8.1)

## 2022-01-22 LAB — BCR-ABL1 FISH
Cells Analyzed: 200
Cells Counted: 200

## 2022-01-27 LAB — BCR-ABL1, CML/ALL, PCR, QUANT: Interpretation (BCRAL):: NEGATIVE

## 2022-01-31 ENCOUNTER — Inpatient Hospital Stay: Payer: BC Managed Care – PPO | Attending: Oncology | Admitting: Oncology

## 2022-01-31 ENCOUNTER — Encounter: Payer: Self-pay | Admitting: Oncology

## 2022-01-31 VITALS — BP 119/79 | HR 87 | Temp 97.9°F | Wt 192.4 lb

## 2022-01-31 DIAGNOSIS — C9210 Chronic myeloid leukemia, BCR/ABL-positive, not having achieved remission: Secondary | ICD-10-CM

## 2022-01-31 DIAGNOSIS — C9211 Chronic myeloid leukemia, BCR/ABL-positive, in remission: Secondary | ICD-10-CM | POA: Insufficient documentation

## 2022-01-31 DIAGNOSIS — E039 Hypothyroidism, unspecified: Secondary | ICD-10-CM | POA: Insufficient documentation

## 2022-01-31 MED ORDER — NILOTINIB HCL 150 MG PO CAPS: ORAL_CAPSULE | ORAL | 5 refills | Status: AC

## 2022-01-31 NOTE — Assessment & Plan Note (Signed)
#  CML, in molecular remission Labs reviewed and discussed with patient Patient is in molecular remission- MR 4 Conitnue  Tasigna 300 mg twice daily

## 2022-01-31 NOTE — Progress Notes (Signed)
Hematology/Oncology follow up note [phone]    Patient Care Team: McLean-Scocuzza, Nino Glow, MD as PCP - General (Internal Medicine) [name], MD as Consulting Physician (Oncology)   ASSESSMENT & PLAN:   CML (chronic myelocytic leukemia) ([place]) #CML, in molecular remission Labs reviewed and discussed with patient Patient is in molecular remission- MR 4 Conitnue  Tasigna 300 mg twice daily  Orders Placed This Encounter  Procedures   CBC with Differential/Platelet    Standing Status:   Future    Standing Expiration Date:   01/31/2023   Comprehensive metabolic panel    Standing Status:   Future    Standing Expiration Date:   01/31/2023   BCR-ABL1 FISH    Standing Status:   Future    Standing Expiration Date:   02/01/2023   BCR-ABL1, CML/ALL, PCR, QUANT    Standing Status:   Future    Standing Expiration Date:   02/01/2023   EKG 12-Lead   EKG 12-Lead    Standing Status:   Future    Standing Expiration Date:   02/01/2023   Follow up in 3 months  All questions were answered. The patient knows to call the clinic with any problems, questions or concerns.  [name], MD, PhD [facility] Hematology Oncology 01/31/2022   CHIEF COMPLAINTS/PURPOSE OF VISIT:  Management of CML  HISTORY OF PRESENTING ILLNESS:  Jennifer English is a  56 y.o.  female with PMH listed below who was referred to by her oncologist in [place]. [place] to [place] this summer. She has not had local pcp yet. Extensive medical records review was performed.  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 She had dyuria symptoms at that time and lab work up found extreme leukocytosis with WBC 173,500 and she was initially started on hydrea. After CML was confirmed, she was started on Tasigna '300mg'$  BID since then and has achieved remission. . Also had splenomegaly at presentation about 5-6 cm below LCM, resolved. . She reports being complaint on nilotinib however, she has had period of time  when her medication runs out. Although she moved this summer, due to lot of stresses associated with relocation and new job, she waited until now see hematologist.  Denies any fever, chill, weight loss, bleeding events. She used to have EKG done every 3 months and faxed to oncologist. She has not had any EKG done after July this year. No PCP Also reports recently feeling urine has odor and also very mild burning sensation when passing urine. She requests to have urine tested.  Has hypothyroidism, not taking thyroid supplements.  History of iron deficiency due to heavy menstrual periods/uterine fibroids, taking iron supplements.  # 10/09/2012, bone marrow aspirate smears and clots: Markedly hypercellular marrow with prominent myeloid hyperplasia and increasing small hypolobated megakaryocytes. The overall findings are consistent with chronic myeloid leukemia, chronic phase. # 03/22/2015 BCR ABL quantification PCR showed e13a2 (b2a2 p210) transcript <0.001%,e14a2 ( b3a2, p210)  transcript 0.023%, e1a2 (p190) transcript <0.001% # 08/17/2015 BCR ABL quantification PCR showed e13a2 (b2a2 p210) transcript 0.011%,e14a2 ( b3a2, p210)  transcript <0.001%, e1a2 (p190) transcript <0.001%  # 04/25/2016 BCR ABL quantification PCR showed e13a2 (b2a2 p210) transcript <0.001%,e14a2 ( b3a2, p210)  transcript <0.001%, e1a2 (p190) transcript <0.001%  #09/17/2016  BCR ABL quantification PCR showed e13a2 (b2a2 p210) transcript <0.0032%,e14a2 ( b3a2, p210)  transcript <0.0032%, e1a2 (p190) transcript <0.0032%   02/19/2017 BCR ABL quantification PCR showed b2a2 transcript <0.0032%, b3a2 transcript <0.0032%,  E1A2 transcript <0.0032%  cardiology Dr.End was seen by Dr. Velva Harman for abnormal EKG.  In Dr. Tyrell Antonio office, her EKG was normal.  Not consistent with septal infarct.  Early May 2022 patient has had COVID infection.   08/29/2020, BCR ABL 1 FISH negative, BCR ABL 1 quantification PCR positive for BCR-ABL1 e13a2 (b2a2, p210) and  e14a2 (b3a2, p210), IS [id number]. Patient was recommended to repeat testing which was done on 10/03/2020. 10/03/2020, BCR ABL 1 FISH negative, qualification PCR all negative, achieved MR again.    INTERVAL HISTORY Jennifer English is a 56 y.o. female who has above history reviewed by me today presents for follow up of CML.  She feels well, work is busy.  No other new complaints.   Review of Systems  Constitutional:  Negative for appetite change, chills, fatigue and fever.  HENT:   Negative for hearing loss and voice change.   Eyes:  Negative for eye problems.  Respiratory:  Negative for chest tightness and cough.   Cardiovascular:  Negative for chest pain.  Gastrointestinal:  Negative for abdominal distention, abdominal pain and blood in stool.  Endocrine: Negative for hot flashes.  Genitourinary:  Negative for difficulty urinating and frequency.   Musculoskeletal:  Negative for arthralgias.  Skin:  Negative for itching and rash.  Neurological:  Negative for extremity weakness.  Hematological:  Negative for adenopathy.  Psychiatric/Behavioral:  Negative for confusion.    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     SURGICAL HISTORY: Past Surgical History:  Procedure Laterality Date   CESAREAN SECTION     x 1 2003    CYSTECTOMY     middle finger right hand    OTHER SURGICAL HISTORY     cyst removed from finger   TONSILECTOMY, ADENOIDECTOMY, BILATERAL MYRINGOTOMY AND TUBES     TYMPANOSTOMY TUBE PLACEMENT     1978    SOCIAL HISTORY: Social History   Socioeconomic History   Marital status: Married    Spouse name: Not on file   Number of children: Not on file    Years of education: Not on file   Highest education level: Not on file  Occupational History   Not on file  Tobacco Use   Smoking status: Never   Smokeless tobacco: Never  Vaping Use   Vaping Use: Never used  Substance and Sexual Activity   Alcohol use: No   Drug use: No   Sexual activity: Yes    Birth control/protection: None  Other Topics Concern   Not on file  Social History Narrative   Married lives with daughter husband and mother in law   1 daughter    Works Engineer, 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   [place] from [place] 2018    Social Determinants of Health   Financial Resource Strain: Not on file  Food Insecurity: Not on file  Transportation Needs: Not on file  Physical Activity: Not on  file  Stress: Not on file  Social Connections: Not on file  Intimate Partner Violence: Not on file    FAMILY HISTORY: Family History  Problem Relation Age of Onset   Hyperthyroidism Mother    Heart disease Father        CAD with stents    Hyperlipidemia Father    Hypertension Father    Dementia Maternal Grandfather    Melanoma Paternal Grandmother    Bladder Cancer Paternal Grandmother    Cancer Paternal Grandmother        lung/colon ?primary    Arthritis Paternal Grandmother    Bladder Cancer Paternal Grandfather    Cancer Paternal Grandfather        bladder died in 75s    Early death Paternal Grandfather    Alcohol abuse Maternal Grandmother    Hepatitis Maternal Grandmother    Early death Maternal Grandmother    Kidney disease Maternal Grandmother    Macular degeneration Maternal Grandmother    Liver disease Maternal Grandmother        ?alcoholic hepatitis    Hyperthyroidism Sister    Asthma Brother    Asthma Brother    Macular degeneration Maternal Aunt    Breast cancer Neg Hx     ALLERGIES:  is allergic to ibuprofen, levoxyl [levothyroxine sodium],  synthroid [levothyroxine], aspirin, avocado, eggs or egg-derived products, and penicillins.  MEDICATIONS:  Current Outpatient Medications  Medication Sig Dispense Refill   levothyroxine (SYNTHROID) 25 MCG tablet Take 1.5 tablets (37.5 mcg total) by mouth daily before breakfast. 140 tablet 3   fexofenadine (ALLEGRA) 180 MG tablet Take 180 mg by mouth daily as needed for allergies or rhinitis. (Patient not taking: Reported on 07/12/2021)     hydrocortisone 2.5 % cream Apply topically 2 (two) times daily. Right back (Patient not taking: Reported on 01/31/2022) 30 g 11   ketoconazole (NIZORAL) 2 % cream Use 2-3 x per day x 1-3 weeks (Patient not taking: Reported on 01/31/2022) 60 g 11   neomycin-polymyxin b-dexamethasone (MAXITROL) 3.5-10000-0.1 OINT SMARTSIG:1 sparingly In Eye(s) Every Night (Patient not taking: Reported on 01/31/2022)     neomycin-polymyxin b-dexamethasone (MAXITROL) 3.5-10000-0.1 SUSP 1 drop 3 (three) times daily. (Patient not taking: Reported on 10/11/2021)     nilotinib (TASIGNA) 150 MG capsule TAKE 2 CAPSULES ('300MG'$ ) BY  MOUTH TWICE DAILY (12 HOURS APART) ON AN EMPTY STOMACH  1 HOUR BEFORE OR 2 HOURS  AFTER A MEAL 120 capsule 5   No current facility-administered medications for this visit.     PHYSICAL EXAMINATION: ECOG PERFORMANCE STATUS: 0 - Asymptomatic Vitals:   01/31/22 1028  BP: 119/79  Pulse: 87  Temp: 97.9 F (36.6 C)  SpO2: 99%   Filed Weights   01/31/22 1028  Weight: 192 lb 6.4 oz (87.3 kg)   Physical Exam Constitutional:      General: She is not in acute distress.    Appearance: She is not diaphoretic.  HENT:     Head: Normocephalic and atraumatic.     Nose: Nose normal.     Mouth/Throat:     Pharynx: No oropharyngeal exudate.  Eyes:     General: No scleral icterus.    Pupils: Pupils are equal, round, and reactive to light.  Cardiovascular:     Rate and Rhythm: Normal rate and regular rhythm.     Heart sounds: No murmur heard. Pulmonary:      Effort: Pulmonary effort is normal. No respiratory distress.     Breath sounds: No rales.  Chest:     Chest wall: No tenderness.  Abdominal:     General: There is no distension.     Palpations: Abdomen is soft.     Tenderness: There is no abdominal tenderness.  Musculoskeletal:        General: Normal range of motion.     Cervical back: Normal range of motion and neck supple.  Skin:    General: Skin is warm and dry.     Findings: No erythema.  Neurological:     Mental Status: She is alert and oriented to person, place, and time.     Cranial Nerves: No cranial nerve deficit.     Motor: No abnormal muscle tone.     Coordination: Coordination normal.  Psychiatric:        Mood and Affect: Affect normal.    LABORATORY DATA:  I have reviewed the data as listed    Latest Ref Rng & Units 01/18/2022    8:45 AM 09/27/2021    1:04 PM 06/08/2021    9:34 AM  CBC  WBC 4.0 - 10.5 K/uL 4.1  5.5  4.2   Hemoglobin 12.0 - 15.0 g/dL 12.6  12.9  13.6   Hematocrit 36.0 - 46.0 % 36.8  37.4  39.3   Platelets 150 - 400 K/uL 224  255  246       Latest Ref Rng & Units 01/18/2022    8:45 AM 09/27/2021    1:04 PM 06/08/2021    9:34 AM  CMP  Glucose 70 - 99 mg/dL 99  119  103   BUN 6 - 20 mg/dL '12  11  12   '$ Creatinine 0.44 - 1.00 mg/dL 0.67  0.73  0.76   Sodium 135 - 145 mmol/L 138  134  136   Potassium 3.5 - 5.1 mmol/L 4.0  3.5  4.1   Chloride 98 - 111 mmol/L 105  99  102   CO2 22 - 32 mmol/L '26  28  27   '$ Calcium 8.9 - 10.3 mg/dL 8.9  8.8  9.1   Total Protein 6.5 - 8.1 g/dL 7.8  7.9  7.9   Total Bilirubin 0.3 - 1.2 mg/dL 0.9  0.9  1.1   Alkaline Phos 38 - 126 U/L 57  59  59   AST 15 - 41 U/L '16  17  18   '$ ALT 0 - 44 U/L 15  15  16

## 2022-02-26 ENCOUNTER — Other Ambulatory Visit (HOSPITAL_COMMUNITY): Payer: Self-pay

## 2022-05-03 ENCOUNTER — Inpatient Hospital Stay: Payer: BC Managed Care – PPO | Attending: Oncology

## 2022-05-03 DIAGNOSIS — C9211 Chronic myeloid leukemia, BCR/ABL-positive, in remission: Secondary | ICD-10-CM | POA: Insufficient documentation

## 2022-05-03 DIAGNOSIS — C9210 Chronic myeloid leukemia, BCR/ABL-positive, not having achieved remission: Secondary | ICD-10-CM

## 2022-05-03 LAB — CBC WITH DIFFERENTIAL/PLATELET
Abs Immature Granulocytes: 0.01 10*3/uL (ref 0.00–0.07)
Basophils Absolute: 0.1 10*3/uL (ref 0.0–0.1)
Basophils Relative: 1 %
Eosinophils Absolute: 0.5 10*3/uL (ref 0.0–0.5)
Eosinophils Relative: 10 %
HCT: 39 % (ref 36.0–46.0)
Hemoglobin: 13.3 g/dL (ref 12.0–15.0)
Immature Granulocytes: 0 %
Lymphocytes Relative: 38 %
Lymphs Abs: 1.8 10*3/uL (ref 0.7–4.0)
MCH: 31.4 pg (ref 26.0–34.0)
MCHC: 34.1 g/dL (ref 30.0–36.0)
MCV: 92.2 fL (ref 80.0–100.0)
Monocytes Absolute: 0.3 10*3/uL (ref 0.1–1.0)
Monocytes Relative: 6 %
Neutro Abs: 2.1 10*3/uL (ref 1.7–7.7)
Neutrophils Relative %: 45 %
Platelets: 236 10*3/uL (ref 150–400)
RBC: 4.23 MIL/uL (ref 3.87–5.11)
RDW: 11.9 % (ref 11.5–15.5)
WBC: 4.7 10*3/uL (ref 4.0–10.5)
nRBC: 0 % (ref 0.0–0.2)

## 2022-05-03 LAB — COMPREHENSIVE METABOLIC PANEL
ALT: 16 U/L (ref 0–44)
AST: 18 U/L (ref 15–41)
Albumin: 4.1 g/dL (ref 3.5–5.0)
Alkaline Phosphatase: 66 U/L (ref 38–126)
Anion gap: 8 (ref 5–15)
BUN: 15 mg/dL (ref 6–20)
CO2: 26 mmol/L (ref 22–32)
Calcium: 8.9 mg/dL (ref 8.9–10.3)
Chloride: 100 mmol/L (ref 98–111)
Creatinine, Ser: 0.73 mg/dL (ref 0.44–1.00)
GFR, Estimated: >60 mL/min (ref >60)
Glucose, Bld: 92 mg/dL (ref 70–99)
Potassium: 3.8 mmol/L (ref 3.5–5.1)
Sodium: 134 mmol/L — ABNORMAL LOW (ref 135–145)
Total Bilirubin: 0.7 mg/dL (ref 0.3–1.2)
Total Protein: 8.1 g/dL (ref 6.5–8.1)

## 2022-05-07 LAB — BCR-ABL1 FISH
Cells Analyzed: 200
Cells Counted: 200

## 2022-05-10 LAB — BCR-ABL1, CML/ALL, PCR, QUANT: Interpretation (BCRAL):: NEGATIVE

## 2022-05-17 ENCOUNTER — Inpatient Hospital Stay: Payer: BC Managed Care – PPO | Admitting: Oncology

## 2022-05-17 ENCOUNTER — Encounter: Payer: Self-pay | Admitting: Oncology

## 2022-05-17 VITALS — BP 119/86 | HR 75 | Temp 97.6°F | Resp 18 | Wt 193.0 lb

## 2022-05-17 DIAGNOSIS — C9210 Chronic myeloid leukemia, BCR/ABL-positive, not having achieved remission: Secondary | ICD-10-CM

## 2022-05-17 DIAGNOSIS — C9211 Chronic myeloid leukemia, BCR/ABL-positive, in remission: Secondary | ICD-10-CM | POA: Diagnosis not present

## 2022-05-17 NOTE — Progress Notes (Signed)
Hematology/Oncology follow up note [phone]   CHIEF COMPLAINTS/PURPOSE OF VISIT:  Management of CML  ASSESSMENT & PLAN:   CML (chronic myelocytic leukemia) ([place]) *#CML, in molecular remission Labs reviewed and discussed with patient Patient is in molecular remission- MR 4 EKG was performed in the clinic and reviewed by me. Normal Qtc. Given that patient has been on Tasigna for > 8 years, Molecular remission >6 years, she could consider stopping TKI with close monitoring. She understands that 20-30% patients may develop relapse.  She agrees with the plan. She will finish current supply in 3 weeks.  I recommend monitoring CML labs monthly during first year, and every 2 months for second year, every 3 months after second year.    Orders Placed This Encounter  Procedures   CBC with Differential ([place] Only)    Standing Status:   Standing    Number of Occurrences:   5    Standing Expiration Date:   05/18/2023   BCR-ABL1 FISH    Standing Status:   Standing    Number of Occurrences:   5    Standing Expiration Date:   05/18/2023   BCR-ABL1, CML/ALL, PCR, QUANT    Standing Status:   Standing    Number of Occurrences:   5    Standing Expiration Date:   05/18/2023   CBC with Differential ([facility] Only)    Standing Status:   Future    Standing Expiration Date:   05/18/2023   CMP ([place] only)    Standing Status:   Future    Standing Expiration Date:   05/18/2023   BCR-ABL1, CML/ALL, PCR, QUANT    Standing Status:   Future    Standing Expiration Date:   05/18/2023   BCR-ABL1 FISH    Standing Status:   Future    Standing Expiration Date:   05/18/2023   EKG 12-Lead    Standing Status:   Future    Number of Occurrences:   1    Standing Expiration Date:   05/18/2023   Follow up in 6 months   We spent sufficient time to discuss many aspect of care, questions were answered to patient's satisfaction. A total of 25 minutes was spent on this visit.  With 5  minutes spent reviewing image findings, pathology reports, 15 minutes counseling the patient plan of stopping TKI, blood work monitoring and management of relapse if it occurs.  Additional 5 minutes was spent on answering patient's questions.   [name], MD, PhD [facility] Hematology Oncology 05/17/2022     HISTORY OF PRESENTING ILLNESS:  Jennifer English is a  57 y.o.  female with PMH listed below who was referred to by her oncologist in [place]. Patient relocated from [place] to [place] this summer. She has not had local pcp yet. Extensive medical records review was performed.  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 She had dyuria symptoms at that time and lab work up found extreme leukocytosis with WBC 173,500 and she was initially started on hydrea. After CML was confirmed, she was started on Tasigna '300mg'$  BID since then and has achieved remission. . Also had splenomegaly at presentation about 5-6 cm below LCM, resolved. . She reports being complaint on nilotinib however, she has had period of time when her medication runs out. Although she moved this summer, due to lot of stresses associated with relocation and new job, she waited until now see hematologist.  Denies any fever, chill,  weight loss, bleeding events. She used to have EKG done every 3 months and faxed to oncologist. She has not had any EKG done after July this year. No PCP Also reports recently feeling urine has odor and also very mild burning sensation when passing urine. She requests to have urine tested.  Has hypothyroidism, not taking thyroid supplements.  History of iron deficiency due to heavy menstrual periods/uterine fibroids, taking iron supplements.  # 10/09/2012, bone marrow aspirate smears and clots: Markedly hypercellular marrow with prominent myeloid hyperplasia and increasing small hypolobated megakaryocytes. The overall findings are consistent with chronic myeloid leukemia, chronic phase. # 03/22/2015 BCR ABL  quantification PCR showed e13a2 (b2a2 p210) transcript <0.001%,e14a2 ( b3a2, p210)  transcript 0.023%, e1a2 (p190) transcript <0.001% # 08/17/2015 BCR ABL quantification PCR showed e13a2 (b2a2 p210) transcript 0.011%,e14a2 ( b3a2, p210)  transcript <0.001%, e1a2 (p190) transcript <0.001%  # 04/25/2016 BCR ABL quantification PCR showed e13a2 (b2a2 p210) transcript <0.001%,e14a2 ( b3a2, p210)  transcript <0.001%, e1a2 (p190) transcript <0.001%  #09/17/2016  BCR ABL quantification PCR showed e13a2 (b2a2 p210) transcript <0.0032%,e14a2 ( b3a2, p210)  transcript <0.0032%, e1a2 (p190) transcript <0.0032%   02/19/2017 BCR ABL quantification PCR showed b2a2 transcript <0.0032%, b3a2 transcript <0.0032%, E1A2 transcript <0.0032%  cardiology Dr.End was seen by Dr. Velva Harman for abnormal EKG.  In Dr. Tyrell Antonio office, her EKG was normal.  Not consistent with septal infarct.  Early May 2022 patient has had COVID infection.   08/29/2020, BCR ABL 1 FISH negative, BCR ABL 1 quantification PCR positive for BCR-ABL1 e13a2 (b2a2, p210) and e14a2 (b3a2, p210), IS [id number]. Patient was recommended to repeat testing which was done on 10/03/2020. 10/03/2020, BCR ABL 1 FISH negative, qualification PCR all negative, achieved MR again.    INTERVAL HISTORY BEVERELY English is a 57 y.o. female who has above history reviewed by me today presents for follow up of CML.  She feels well, work is busy.  No other new complaints.   Review of Systems  Constitutional:  Negative for appetite change, chills, fatigue and fever.  HENT:   Negative for hearing loss and voice change.   Eyes:  Negative for eye problems.  Respiratory:  Negative for chest tightness and cough.   Cardiovascular:  Negative for chest pain.  Gastrointestinal:  Negative for abdominal distention, abdominal pain and blood in stool.  Endocrine: Negative for hot flashes.  Genitourinary:  Negative for difficulty urinating and frequency.   Musculoskeletal:  Negative for  arthralgias.  Skin:  Negative for itching and rash.  Neurological:  Negative for extremity weakness.  Hematological:  Negative for adenopathy.  Psychiatric/Behavioral:  Negative for confusion.    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     SURGICAL HISTORY: Past Surgical History:  Procedure Laterality Date   CESAREAN SECTION     x 1 2003    CYSTECTOMY     middle finger right hand    OTHER SURGICAL HISTORY     cyst removed from finger   TONSILECTOMY, ADENOIDECTOMY, BILATERAL MYRINGOTOMY AND TUBES  TYMPANOSTOMY TUBE PLACEMENT     1978    SOCIAL HISTORY: Social History   Socioeconomic History   Marital status: Married    Spouse name: Not on file   Number of children: Not on file   Years of education: Not on file   Highest education level: Not on file  Occupational History   Not on file  Tobacco Use   Smoking status: Never   Smokeless tobacco: Never  Vaping Use   Vaping Use: Never used  Substance and Sexual Activity   Alcohol use: No   Drug use: No   Sexual activity: Yes    Birth control/protection: None  Other Topics Concern   Not on file  Social History Narrative   Married lives with daughter husband and mother in law   1 daughter    Works Engineer, 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   [place] from [place] 2018    Social Determinants of Health   Financial Resource Strain: Not on file  Food Insecurity: Not on file  Transportation Needs: Not on file  Physical Activity: Not on file  Stress: Not on file  Social Connections: Not on file  Intimate  Partner Violence: Not on file    FAMILY HISTORY: Family History  Problem Relation Age of Onset   Hyperthyroidism Mother    Heart disease Father        CAD with stents    Hyperlipidemia Father    Hypertension Father    Dementia Maternal Grandfather    Melanoma Paternal Grandmother    Bladder Cancer Paternal Grandmother    Cancer Paternal Grandmother        lung/colon ?primary    Arthritis Paternal Grandmother    Bladder Cancer Paternal Grandfather    Cancer Paternal Grandfather        bladder died in 26s    Early death Paternal Grandfather    Alcohol abuse Maternal Grandmother    Hepatitis Maternal Grandmother    Early death Maternal Grandmother    Kidney disease Maternal Grandmother    Macular degeneration Maternal Grandmother    Liver disease Maternal Grandmother        ?alcoholic hepatitis    Hyperthyroidism Sister    Asthma Brother    Asthma Brother    Macular degeneration Maternal Aunt    Breast cancer Neg Hx     ALLERGIES:  is allergic to ibuprofen, levoxyl [levothyroxine sodium], synthroid [levothyroxine], aspirin, avocado, eggs or egg-derived products, and penicillins.  MEDICATIONS:  Current Outpatient Medications  Medication Sig Dispense Refill   levothyroxine (SYNTHROID) 25 MCG tablet Take 1.5 tablets (37.5 mcg total) by mouth daily before breakfast. 140 tablet 3   nilotinib (TASIGNA) 150 MG capsule TAKE 2 CAPSULES ('300MG'$ ) BY  MOUTH TWICE DAILY (12 HOURS APART) ON AN EMPTY STOMACH  1 HOUR BEFORE OR 2 HOURS  AFTER A MEAL 120 capsule 5   fexofenadine (ALLEGRA) 180 MG tablet Take 180 mg by mouth daily as needed for allergies or rhinitis. (Patient not taking: Reported on 07/12/2021)     hydrocortisone 2.5 % cream Apply topically 2 (two) times daily. Right back (Patient not taking: Reported on 01/31/2022) 30 g 11   ketoconazole (NIZORAL) 2 % cream Use 2-3 x per day x 1-3 weeks (Patient not taking: Reported on 01/31/2022) 60 g 11   neomycin-polymyxin b-dexamethasone  (MAXITROL) 3.5-10000-0.1 OINT SMARTSIG:1 sparingly In Eye(s) Every  Night (Patient not taking: Reported on 01/31/2022)     neomycin-polymyxin b-dexamethasone (MAXITROL) 3.5-10000-0.1 SUSP 1 drop 3 (three) times daily. (Patient not taking: Reported on 10/11/2021)     No current facility-administered medications for this visit.     PHYSICAL EXAMINATION: ECOG PERFORMANCE STATUS: 0 - Asymptomatic Vitals:   05/17/22 1150  BP: 119/86  Pulse: 75  Resp: 18  Temp: 97.6 F (36.4 C)   Filed Weights   05/17/22 1150  Weight: 193 lb (87.5 kg)   Physical Exam Constitutional:      General: She is not in acute distress.    Appearance: She is not diaphoretic.  HENT:     Head: Normocephalic and atraumatic.     Nose: Nose normal.     Mouth/Throat:     Pharynx: No oropharyngeal exudate.  Eyes:     General: No scleral icterus.    Pupils: Pupils are equal, round, and reactive to light.  Cardiovascular:     Rate and Rhythm: Normal rate and regular rhythm.     Heart sounds: No murmur heard. Pulmonary:     Effort: Pulmonary effort is normal. No respiratory distress.     Breath sounds: No rales.  Chest:     Chest wall: No tenderness.  Abdominal:     General: There is no distension.     Palpations: Abdomen is soft.     Tenderness: There is no abdominal tenderness.  Musculoskeletal:        General: Normal range of motion.     Cervical back: Normal range of motion and neck supple.  Skin:    General: Skin is warm and dry.     Findings: No erythema.  Neurological:     Mental Status: She is alert and oriented to person, place, and time.     Cranial Nerves: No cranial nerve deficit.     Motor: No abnormal muscle tone.     Coordination: Coordination normal.  Psychiatric:        Mood and Affect: Affect normal.    LABORATORY DATA:  I have reviewed the data as listed    Latest Ref Rng & Units 05/03/2022   10:07 AM 01/18/2022    8:45 AM 09/27/2021    1:04 PM  CBC  WBC 4.0 - 10.5 K/uL 4.7  4.1   5.5   Hemoglobin 12.0 - 15.0 g/dL 13.3  12.6  12.9   Hematocrit 36.0 - 46.0 % 39.0  36.8  37.4   Platelets 150 - 400 K/uL 236  224  255       Latest Ref Rng & Units 05/03/2022   10:07 AM 01/18/2022    8:45 AM 09/27/2021    1:04 PM  CMP  Glucose 70 - 99 mg/dL 92  99  119   BUN 6 - 20 mg/dL '15  12  11   '$ Creatinine 0.44 - 1.00 mg/dL 0.73  0.67  0.73   Sodium 135 - 145 mmol/L 134  138  134   Potassium 3.5 - 5.1 mmol/L 3.8  4.0  3.5   Chloride 98 - 111 mmol/L 100  105  99   CO2 22 - 32 mmol/L '26  26  28   '$ Calcium 8.9 - 10.3 mg/dL 8.9  8.9  8.8   Total Protein 6.5 - 8.1 g/dL 8.1  7.8  7.9   Total Bilirubin 0.3 - 1.2 mg/dL 0.7  0.9  0.9   Alkaline Phos 38 - 126 U/L 66  57  59  AST 15 - 41 U/L '18  16  17   '$ ALT 0 - 44 U/L 16  15  15

## 2022-05-17 NOTE — Assessment & Plan Note (Addendum)
*#  CML, in molecular remission Labs reviewed and discussed with patient Patient is in molecular remission- MR 4 EKG was performed in the clinic and reviewed by me. Normal Qtc. Given that patient has been on Tasigna for > 8 years, Molecular remission >6 years, she could consider stopping TKI with close monitoring. She understands that 20-30% patients may develop relapse.  She agrees with the plan. She will finish current supply in 3 weeks.  I recommend monitoring CML labs monthly during first year, and every 2 months for second year, every 3 months after second year.

## 2022-07-11 ENCOUNTER — Inpatient Hospital Stay: Payer: BC Managed Care – PPO | Attending: Oncology

## 2022-07-11 DIAGNOSIS — C9210 Chronic myeloid leukemia, BCR/ABL-positive, not having achieved remission: Secondary | ICD-10-CM

## 2022-07-11 DIAGNOSIS — C9211 Chronic myeloid leukemia, BCR/ABL-positive, in remission: Secondary | ICD-10-CM | POA: Diagnosis not present

## 2022-07-11 LAB — CMP (CANCER CENTER ONLY)
ALT: 17 U/L (ref 0–44)
AST: 20 U/L (ref 15–41)
Albumin: 4.2 g/dL (ref 3.5–5.0)
Alkaline Phosphatase: 72 U/L (ref 38–126)
Anion gap: 6 (ref 5–15)
BUN: 10 mg/dL (ref 6–20)
CO2: 28 mmol/L (ref 22–32)
Calcium: 9.2 mg/dL (ref 8.9–10.3)
Chloride: 103 mmol/L (ref 98–111)
Creatinine: 0.81 mg/dL (ref 0.44–1.00)
GFR, Estimated: >60 mL/min (ref >60)
Glucose, Bld: 100 mg/dL — ABNORMAL HIGH (ref 70–99)
Potassium: 4 mmol/L (ref 3.5–5.1)
Sodium: 137 mmol/L (ref 135–145)
Total Bilirubin: 0.7 mg/dL (ref 0.3–1.2)
Total Protein: 8 g/dL (ref 6.5–8.1)

## 2022-07-11 LAB — CBC WITH DIFFERENTIAL (CANCER CENTER ONLY)
Abs Immature Granulocytes: 0.01 10*3/uL (ref 0.00–0.07)
Basophils Absolute: 0 10*3/uL (ref 0.0–0.1)
Basophils Relative: 1 %
Eosinophils Absolute: 0.3 10*3/uL (ref 0.0–0.5)
Eosinophils Relative: 7 %
HCT: 40.8 % (ref 36.0–46.0)
Hemoglobin: 14.1 g/dL (ref 12.0–15.0)
Immature Granulocytes: 0 %
Lymphocytes Relative: 33 %
Lymphs Abs: 1.2 10*3/uL (ref 0.7–4.0)
MCH: 31.8 pg (ref 26.0–34.0)
MCHC: 34.6 g/dL (ref 30.0–36.0)
MCV: 92.1 fL (ref 80.0–100.0)
Monocytes Absolute: 0.2 10*3/uL (ref 0.1–1.0)
Monocytes Relative: 6 %
Neutro Abs: 1.9 10*3/uL (ref 1.7–7.7)
Neutrophils Relative %: 53 %
Platelet Count: 246 10*3/uL (ref 150–400)
RBC: 4.43 MIL/uL (ref 3.87–5.11)
RDW: 11.6 % (ref 11.5–15.5)
WBC Count: 3.7 10*3/uL — ABNORMAL LOW (ref 4.0–10.5)
nRBC: 0 % (ref 0.0–0.2)

## 2022-07-16 ENCOUNTER — Encounter: Payer: BC Managed Care – PPO | Admitting: Internal Medicine

## 2022-07-19 LAB — BCR-ABL1 FISH
Cells Analyzed: 200
Cells Counted: 200

## 2022-07-21 LAB — BCR-ABL1, CML/ALL, PCR, QUANT: Interpretation (BCRAL):: NEGATIVE

## 2022-07-22 ENCOUNTER — Encounter: Payer: Self-pay | Admitting: Oncology

## 2022-07-25 ENCOUNTER — Ambulatory Visit: Payer: BC Managed Care – PPO | Admitting: Family Medicine

## 2022-07-25 ENCOUNTER — Encounter: Payer: Self-pay | Admitting: Family Medicine

## 2022-07-25 VITALS — BP 118/82 | HR 89 | Temp 98.5°F | Ht 64.0 in | Wt 188.4 lb

## 2022-07-25 DIAGNOSIS — B354 Tinea corporis: Secondary | ICD-10-CM

## 2022-07-25 DIAGNOSIS — E785 Hyperlipidemia, unspecified: Secondary | ICD-10-CM

## 2022-07-25 DIAGNOSIS — C9210 Chronic myeloid leukemia, BCR/ABL-positive, not having achieved remission: Secondary | ICD-10-CM | POA: Diagnosis not present

## 2022-07-25 DIAGNOSIS — Z114 Encounter for screening for human immunodeficiency virus [HIV]: Secondary | ICD-10-CM

## 2022-07-25 DIAGNOSIS — E039 Hypothyroidism, unspecified: Secondary | ICD-10-CM

## 2022-07-25 DIAGNOSIS — Z1159 Encounter for screening for other viral diseases: Secondary | ICD-10-CM

## 2022-07-25 DIAGNOSIS — R7309 Other abnormal glucose: Secondary | ICD-10-CM

## 2022-07-25 DIAGNOSIS — E538 Deficiency of other specified B group vitamins: Secondary | ICD-10-CM

## 2022-07-25 DIAGNOSIS — E559 Vitamin D deficiency, unspecified: Secondary | ICD-10-CM

## 2022-07-25 DIAGNOSIS — R7989 Other specified abnormal findings of blood chemistry: Secondary | ICD-10-CM

## 2022-07-25 DIAGNOSIS — Z1231 Encounter for screening mammogram for malignant neoplasm of breast: Secondary | ICD-10-CM

## 2022-07-25 DIAGNOSIS — R21 Rash and other nonspecific skin eruption: Secondary | ICD-10-CM | POA: Diagnosis not present

## 2022-07-25 DIAGNOSIS — L259 Unspecified contact dermatitis, unspecified cause: Secondary | ICD-10-CM

## 2022-07-25 DIAGNOSIS — Z1211 Encounter for screening for malignant neoplasm of colon: Secondary | ICD-10-CM

## 2022-07-25 MED ORDER — HYDROCORTISONE 2.5 % EX CREA
TOPICAL_CREAM | Freq: Two times a day (BID) | CUTANEOUS | 2 refills | Status: AC
Start: 2022-07-25 — End: ?

## 2022-07-25 NOTE — Progress Notes (Signed)
SUBJECTIVE:   Chief Complaint  Patient presents with   Establish Care   HPI Patient presents to clinic to transfer care.  No acute concerns today.  Patient reports a rash on her back.  Symptoms started 1 month ago.  Thought due to fungal infection.  Had similar symptoms in past and treated with hydrocortisone.  Requesting refill of hydrocortisone cream.  Has self discontinued Allegra.  Hypothyroid Asymptomatic.  Currently taking levothyroxine 37.5 mcg daily.  Follows with Dr. Everardo All at [facility] endocrinology.  History of CML Currently on to Cigna 150 mg 2 capsules twice daily.  Follows with oncology Dr. Cathie Hoops.  PERTINENT PMH / PSH: CML Hyperlipidemia Hypothyroid Eczema   OBJECTIVE:  BP 118/82   Pulse 89   Temp 98.5 F (36.9 C) (Oral)   Ht 5\' 4"  (1.626 m)   Wt 188 lb 6.4 oz (85.5 kg)   LMP 06/13/2017   SpO2 97%   BMI 32.34 kg/m    Physical Exam Vitals reviewed.  Constitutional:      General: She is not in acute distress.    Appearance: She is obese. She is not ill-appearing.  HENT:     Head: Normocephalic.     Nose: Nose normal.  Eyes:     Conjunctiva/sclera: Conjunctivae normal.  Neck:     Thyroid: No thyromegaly or thyroid tenderness.  Cardiovascular:     Rate and Rhythm: Normal rate and regular rhythm.     Heart sounds: Normal heart sounds.  Pulmonary:     Effort: Pulmonary effort is normal.     Breath sounds: Normal breath sounds.  Abdominal:     General: Abdomen is flat. Bowel sounds are normal.     Palpations: Abdomen is soft.  Musculoskeletal:        General: Normal range of motion.     Cervical back: Normal range of motion.  Skin:    Findings: Rash present. Rash is scaling. Rash is not vesicular.  Neurological:     Mental Status: She is alert and oriented to person, place, and time. Mental status is at baseline.  Psychiatric:        Mood and Affect: Mood normal.        Behavior: Behavior normal.        Thought Content: Thought content  normal.        Judgment: Judgment normal.     ASSESSMENT/PLAN:  Hyperlipidemia, unspecified hyperlipidemia type Assessment & Plan: Chronic. Not currently on statin therapy. 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  Doing well.  Follows with Dr. Cathie Hoops, oncology.   Rash Assessment & Plan: Chronic.  Consistent with contact dermatitis Refill hydrocortisone 2.5% cream twice daily x 5 days then as needed Follow-up if symptoms worsen  Orders: -     Hydrocortisone; Apply topically 2 (two) times daily. Apply to affected areas sparingly two times a day for 5 days then as needed  Dispense: 30 g; Refill: 2  Colon cancer screening -     Cologuard; Future  Breast cancer screening by mammogram -     3D Screening Mammogram, Left and Right; Future  Encounter for screening for HIV -     HIV Antibody (routine testing w rflx); Future  Encounter for hepatitis C screening test for low risk patient -     Hepatitis C antibody; Future  Abnormal glucose -     Hemoglobin A1c; Future  Low  serum vitamin D -     VITAMIN D 25 Hydroxy (Vit-D Deficiency, Fractures); Future  B12 deficiency -     Vitamin B12; Future  Hypothyroidism, unspecified type Assessment & Plan: Chronic.  Tolerating medication well. Managed with levothyroxine 37.5 mg daily Follow-up with endocrinology as scheduled    Vitamin D deficiency Assessment & Plan: History of low vitamin D Check vitamin D   HCM Declined colonoscopy Agreeable to Cologuard.  Orders placed today Refer for mammogram Hep C/HIV screening labs placed Recommend shingles vaccine Last Pap 04/2011, negative, HPV negative.  Patient aware to schedule appointment for repeat Pap. Recommend tetanus booster   PDMP reviewed  Return in 11 weeks (on 10/10/2022), or if symptoms worsen or fail to improve.  Dana Allan, MD

## 2022-07-25 NOTE — Patient Instructions (Addendum)
It was a pleasure meeting you today. Thank you for allowing me to take part in your health care.  Our goals for today as we discussed include:  Recommend Tetanus Vaccination.  This is given every 10 years.   Recommend Shingles vaccine.  This is a 2 dose series and can be given at your local pharmacy.  Please talk to your pharmacist about this.   Start Hydrocortisone cream 2.5 % twice a day for 5 days then as needed to rash on back  Referral sent for Mammogram. Please call to schedule appointment. [facility] [address] [place], [place] [address] [phone]   Cologuard referral sent.  Follow instructions when received.  Please schedule appointment for PAP at your earliest convenience   If you have any questions or concerns, please do not hesitate to call the office at [phone].  I look forward to our next visit and until then take care and stay safe.  Regards,   Dana Allan, MD   [facility]

## 2022-08-08 ENCOUNTER — Inpatient Hospital Stay: Payer: BC Managed Care – PPO | Attending: Oncology

## 2022-08-08 ENCOUNTER — Other Ambulatory Visit: Payer: BC Managed Care – PPO

## 2022-08-08 DIAGNOSIS — C9211 Chronic myeloid leukemia, BCR/ABL-positive, in remission: Secondary | ICD-10-CM | POA: Diagnosis not present

## 2022-08-08 DIAGNOSIS — C9210 Chronic myeloid leukemia, BCR/ABL-positive, not having achieved remission: Secondary | ICD-10-CM

## 2022-08-08 LAB — CBC WITH DIFFERENTIAL (CANCER CENTER ONLY)
Abs Immature Granulocytes: 0.01 10*3/uL (ref 0.00–0.07)
Basophils Absolute: 0 10*3/uL (ref 0.0–0.1)
Basophils Relative: 1 %
Eosinophils Absolute: 0.2 10*3/uL (ref 0.0–0.5)
Eosinophils Relative: 4 %
HCT: 37.8 % (ref 36.0–46.0)
Hemoglobin: 12.8 g/dL (ref 12.0–15.0)
Immature Granulocytes: 0 %
Lymphocytes Relative: 38 %
Lymphs Abs: 1.5 10*3/uL (ref 0.7–4.0)
MCH: 31.4 pg (ref 26.0–34.0)
MCHC: 33.9 g/dL (ref 30.0–36.0)
MCV: 92.6 fL (ref 80.0–100.0)
Monocytes Absolute: 0.2 10*3/uL (ref 0.1–1.0)
Monocytes Relative: 6 %
Neutro Abs: 2 10*3/uL (ref 1.7–7.7)
Neutrophils Relative %: 51 %
Platelet Count: 226 10*3/uL (ref 150–400)
RBC: 4.08 MIL/uL (ref 3.87–5.11)
RDW: 11.8 % (ref 11.5–15.5)
WBC Count: 3.9 10*3/uL — ABNORMAL LOW (ref 4.0–10.5)
nRBC: 0 % (ref 0.0–0.2)

## 2022-08-09 ENCOUNTER — Encounter: Payer: Self-pay | Admitting: Oncology

## 2022-08-11 ENCOUNTER — Encounter: Payer: Self-pay | Admitting: Family Medicine

## 2022-08-11 DIAGNOSIS — Z1231 Encounter for screening mammogram for malignant neoplasm of breast: Secondary | ICD-10-CM | POA: Insufficient documentation

## 2022-08-11 DIAGNOSIS — Z1159 Encounter for screening for other viral diseases: Secondary | ICD-10-CM | POA: Insufficient documentation

## 2022-08-11 DIAGNOSIS — R7989 Other specified abnormal findings of blood chemistry: Secondary | ICD-10-CM | POA: Insufficient documentation

## 2022-08-11 DIAGNOSIS — Z1211 Encounter for screening for malignant neoplasm of colon: Secondary | ICD-10-CM | POA: Insufficient documentation

## 2022-08-11 DIAGNOSIS — E538 Deficiency of other specified B group vitamins: Secondary | ICD-10-CM | POA: Insufficient documentation

## 2022-08-11 DIAGNOSIS — Z114 Encounter for screening for human immunodeficiency virus [HIV]: Secondary | ICD-10-CM | POA: Insufficient documentation

## 2022-08-11 DIAGNOSIS — R7309 Other abnormal glucose: Secondary | ICD-10-CM | POA: Insufficient documentation

## 2022-08-11 NOTE — Assessment & Plan Note (Signed)
Chronic.  Not currently on statin therapy. Check fasting lipids 

## 2022-08-11 NOTE — Assessment & Plan Note (Signed)
Chronic.  Tolerating medication well. Managed with levothyroxine 37.5 mg daily Follow-up with endocrinology as scheduled

## 2022-08-11 NOTE — Assessment & Plan Note (Signed)
Chronic.  Consistent with contact dermatitis Refill hydrocortisone 2.5% cream twice daily x 5 days then as needed Follow-up if symptoms worsen

## 2022-08-11 NOTE — Assessment & Plan Note (Signed)
Chronic.  Doing well.  Follows with Dr. Cathie Hoops, oncology.

## 2022-08-11 NOTE — Assessment & Plan Note (Signed)
History of low vitamin D Check vitamin D

## 2022-08-14 ENCOUNTER — Other Ambulatory Visit: Payer: BC Managed Care – PPO

## 2022-08-14 LAB — BCR-ABL1 FISH
Cells Analyzed: 200
Cells Counted: 200

## 2022-08-15 LAB — BCR-ABL1, CML/ALL, PCR, QUANT: Interpretation (BCRAL):: NEGATIVE

## 2022-08-30 ENCOUNTER — Other Ambulatory Visit: Payer: BC Managed Care – PPO

## 2022-09-12 ENCOUNTER — Inpatient Hospital Stay: Payer: BC Managed Care – PPO | Attending: Oncology

## 2022-09-12 DIAGNOSIS — C9210 Chronic myeloid leukemia, BCR/ABL-positive, not having achieved remission: Secondary | ICD-10-CM

## 2022-09-12 DIAGNOSIS — C9211 Chronic myeloid leukemia, BCR/ABL-positive, in remission: Secondary | ICD-10-CM | POA: Insufficient documentation

## 2022-09-12 LAB — CBC WITH DIFFERENTIAL (CANCER CENTER ONLY)
Abs Immature Granulocytes: 0.01 10*3/uL (ref 0.00–0.07)
Basophils Absolute: 0 10*3/uL (ref 0.0–0.1)
Basophils Relative: 1 %
Eosinophils Absolute: 0.3 10*3/uL (ref 0.0–0.5)
Eosinophils Relative: 8 %
HCT: 38.8 % (ref 36.0–46.0)
Hemoglobin: 13.1 g/dL (ref 12.0–15.0)
Immature Granulocytes: 0 %
Lymphocytes Relative: 35 %
Lymphs Abs: 1.4 10*3/uL (ref 0.7–4.0)
MCH: 31 pg (ref 26.0–34.0)
MCHC: 33.8 g/dL (ref 30.0–36.0)
MCV: 91.7 fL (ref 80.0–100.0)
Monocytes Absolute: 0.3 10*3/uL (ref 0.1–1.0)
Monocytes Relative: 7 %
Neutro Abs: 2 10*3/uL (ref 1.7–7.7)
Neutrophils Relative %: 49 %
Platelet Count: 243 10*3/uL (ref 150–400)
RBC: 4.23 MIL/uL (ref 3.87–5.11)
RDW: 11.9 % (ref 11.5–15.5)
WBC Count: 4 10*3/uL (ref 4.0–10.5)
nRBC: 0 % (ref 0.0–0.2)

## 2022-09-16 LAB — BCR-ABL1 FISH
Cells Analyzed: 200
Cells Counted: 200

## 2022-09-20 LAB — BCR-ABL1, CML/ALL, PCR, QUANT
E1A2 Transcript: <0.0032 %
Interpretation (BCRAL):: NEGATIVE
b2a2 transcript: <0.0032 %
b3a2 transcript: <0.0032 %

## 2022-10-01 ENCOUNTER — Ambulatory Visit (INDEPENDENT_AMBULATORY_CARE_PROVIDER_SITE_OTHER): Payer: BC Managed Care – PPO

## 2022-10-01 ENCOUNTER — Ambulatory Visit: Payer: BC Managed Care – PPO | Admitting: Podiatry

## 2022-10-01 DIAGNOSIS — M79672 Pain in left foot: Secondary | ICD-10-CM

## 2022-10-01 DIAGNOSIS — M79671 Pain in right foot: Secondary | ICD-10-CM

## 2022-10-01 DIAGNOSIS — M722 Plantar fascial fibromatosis: Secondary | ICD-10-CM

## 2022-10-06 DIAGNOSIS — M722 Plantar fascial fibromatosis: Secondary | ICD-10-CM

## 2022-10-06 DIAGNOSIS — M79671 Pain in right foot: Secondary | ICD-10-CM | POA: Diagnosis not present

## 2022-10-06 MED ORDER — BETAMETHASONE SOD PHOS & ACET 6 (3-3) MG/ML IJ SUSP
3.0000 mg | Freq: Once | INTRAMUSCULAR | Status: AC
Start: 2022-10-06 — End: 2022-10-06
  Administered 2022-10-06: 3 mg via INTRA_ARTICULAR

## 2022-10-06 NOTE — Progress Notes (Signed)
   Chief Complaint  Patient presents with   Foot Pain    Patient came in today for bilateral foot pain, top of the right foot, left lateral side and heel, rate of pain 7 out of 10, after working all day, X-Rays done today    HPI: 57 y.o. female presenting today for evaluation of bilateral foot pain.  Idiopathic onset.  Denies a history of injury.  She has not anything for treatment.  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  Dermatology: Skin is warm, dry and supple bilateral lower extremities.   Vascular: Palpable pedal pulses bilaterally. Capillary refill within normal limits.  No appreciable edema.  No erythema.  Neurological: Grossly intact via light touch  Musculoskeletal Exam: No pedal deformities noted.  There is some tenderness with palpation noted to the bilateral feet diffusely as well as the lateral aspect the left heel  Radiographic Exam B/L feet 10/01/2022:  Normal osseous mineralization. Joint spaces preserved.  No fractures or osseous irregularities noted.  There is a small plantar  calcaneal spurring noted especially on medial oblique view of the left foot.  Impression: Small plantar calcaneal heel spur left  Assessment/Plan of Care: 1.  Generalized diffuse pain bilateral feet as well as the lateral aspect of the left heel  -Patient evaluated.  X-rays reviewed -Recommend good supportive tennis shoes and sneakers -Advised against going barefoot -Injection of 0.5 cc Celestone Soluspan injected into the lateral aspect of the left heel -Return to clinic 4 weeks        Felecia Shelling, DPM [facility]  Dr. Felecia Shelling, DPM    [address] [place], [place] [address]                Office [phone]  Fax [phone]

## 2022-10-10 ENCOUNTER — Ambulatory Visit: Payer: BC Managed Care – PPO | Admitting: Family Medicine

## 2022-10-10 ENCOUNTER — Inpatient Hospital Stay: Payer: BC Managed Care – PPO | Attending: Oncology

## 2022-10-17 ENCOUNTER — Ambulatory Visit: Payer: BC Managed Care – PPO | Admitting: Family Medicine

## 2022-10-29 ENCOUNTER — Ambulatory Visit: Payer: BC Managed Care – PPO | Admitting: Podiatry

## 2022-11-14 ENCOUNTER — Other Ambulatory Visit: Payer: Self-pay

## 2022-11-14 ENCOUNTER — Inpatient Hospital Stay: Payer: BC Managed Care – PPO | Attending: Oncology

## 2022-11-14 ENCOUNTER — Telehealth: Payer: Self-pay | Admitting: Endocrinology

## 2022-11-14 DIAGNOSIS — C9210 Chronic myeloid leukemia, BCR/ABL-positive, not having achieved remission: Secondary | ICD-10-CM

## 2022-11-14 DIAGNOSIS — C9211 Chronic myeloid leukemia, BCR/ABL-positive, in remission: Secondary | ICD-10-CM | POA: Diagnosis not present

## 2022-11-14 LAB — CBC WITH DIFFERENTIAL (CANCER CENTER ONLY)
Abs Immature Granulocytes: 0.01 10*3/uL (ref 0.00–0.07)
Basophils Absolute: 0 10*3/uL (ref 0.0–0.1)
Basophils Relative: 1 %
Eosinophils Absolute: 0.3 10*3/uL (ref 0.0–0.5)
Eosinophils Relative: 6 %
HCT: 40.6 % (ref 36.0–46.0)
Hemoglobin: 13.8 g/dL (ref 12.0–15.0)
Immature Granulocytes: 0 %
Lymphocytes Relative: 39 %
Lymphs Abs: 1.6 10*3/uL (ref 0.7–4.0)
MCH: 31.4 pg (ref 26.0–34.0)
MCHC: 34 g/dL (ref 30.0–36.0)
MCV: 92.3 fL (ref 80.0–100.0)
Monocytes Absolute: 0.3 10*3/uL (ref 0.1–1.0)
Monocytes Relative: 7 %
Neutro Abs: 1.9 10*3/uL (ref 1.7–7.7)
Neutrophils Relative %: 47 %
Platelet Count: 243 10*3/uL (ref 150–400)
RBC: 4.4 MIL/uL (ref 3.87–5.11)
RDW: 11.5 % (ref 11.5–15.5)
WBC Count: 4.1 10*3/uL (ref 4.0–10.5)
nRBC: 0 % (ref 0.0–0.2)

## 2022-11-14 NOTE — Telephone Encounter (Signed)
Patient advised she is out of her medication and is asking for a refill till her appointment with Dr. Erroll Luna on 10/4 at 11 am. Please advise

## 2022-11-15 ENCOUNTER — Other Ambulatory Visit: Payer: Self-pay | Admitting: Podiatry

## 2022-11-15 ENCOUNTER — Other Ambulatory Visit: Payer: Self-pay

## 2022-11-15 DIAGNOSIS — M79671 Pain in right foot: Secondary | ICD-10-CM

## 2022-11-15 DIAGNOSIS — E039 Hypothyroidism, unspecified: Secondary | ICD-10-CM

## 2022-11-15 DIAGNOSIS — M722 Plantar fascial fibromatosis: Secondary | ICD-10-CM

## 2022-11-15 MED ORDER — LEVOTHYROXINE SODIUM 25 MCG PO TABS
37.5000 ug | ORAL_TABLET | Freq: Every day | ORAL | 3 refills | Status: DC
Start: 2022-11-15 — End: 2023-02-13

## 2022-11-15 NOTE — Telephone Encounter (Signed)
Levothyroxine was sent to [facility] in [place]

## 2022-11-18 LAB — BCR-ABL1 FISH
Cells Analyzed: 200
Cells Counted: 200

## 2022-11-25 LAB — BCR-ABL1, CML/ALL, PCR, QUANT
E1A2 Transcript: <0.0032 %
Interpretation (BCRAL):: NEGATIVE
b2a2 transcript: <0.0032 %
b3a2 transcript: <0.0032 %

## 2022-12-16 ENCOUNTER — Encounter: Payer: Self-pay | Admitting: Oncology

## 2022-12-16 NOTE — Telephone Encounter (Signed)
Pt last had labs last on 8/22, her next lab appt is on 10/17. Pt asking if she needs anther lab this month?

## 2022-12-27 ENCOUNTER — Ambulatory Visit: Payer: BC Managed Care – PPO | Admitting: Endocrinology

## 2023-01-09 ENCOUNTER — Inpatient Hospital Stay: Payer: BC Managed Care – PPO | Attending: Oncology

## 2023-01-09 DIAGNOSIS — C9211 Chronic myeloid leukemia, BCR/ABL-positive, in remission: Secondary | ICD-10-CM | POA: Insufficient documentation

## 2023-01-09 DIAGNOSIS — C9210 Chronic myeloid leukemia, BCR/ABL-positive, not having achieved remission: Secondary | ICD-10-CM

## 2023-01-09 LAB — CBC WITH DIFFERENTIAL (CANCER CENTER ONLY)
Abs Immature Granulocytes: 0.01 10*3/uL (ref 0.00–0.07)
Basophils Absolute: 0.1 10*3/uL (ref 0.0–0.1)
Basophils Relative: 1 %
Eosinophils Absolute: 0.4 10*3/uL (ref 0.0–0.5)
Eosinophils Relative: 11 %
HCT: 39.2 % (ref 36.0–46.0)
Hemoglobin: 13.2 g/dL (ref 12.0–15.0)
Immature Granulocytes: 0 %
Lymphocytes Relative: 36 %
Lymphs Abs: 1.5 10*3/uL (ref 0.7–4.0)
MCH: 31 pg (ref 26.0–34.0)
MCHC: 33.7 g/dL (ref 30.0–36.0)
MCV: 92 fL (ref 80.0–100.0)
Monocytes Absolute: 0.3 10*3/uL (ref 0.1–1.0)
Monocytes Relative: 7 %
Neutro Abs: 1.8 10*3/uL (ref 1.7–7.7)
Neutrophils Relative %: 45 %
Platelet Count: 230 10*3/uL (ref 150–400)
RBC: 4.26 MIL/uL (ref 3.87–5.11)
RDW: 11.9 % (ref 11.5–15.5)
WBC Count: 4.1 10*3/uL (ref 4.0–10.5)
nRBC: 0 % (ref 0.0–0.2)

## 2023-01-14 LAB — BCR-ABL1 FISH
Cells Analyzed: 200
Cells Counted: 200

## 2023-01-16 ENCOUNTER — Inpatient Hospital Stay: Payer: BC Managed Care – PPO | Admitting: Oncology

## 2023-01-16 ENCOUNTER — Encounter: Payer: Self-pay | Admitting: Oncology

## 2023-01-16 VITALS — BP 121/87 | HR 78 | Temp 98.1°F | Resp 18 | Wt 190.5 lb

## 2023-01-16 DIAGNOSIS — C9210 Chronic myeloid leukemia, BCR/ABL-positive, not having achieved remission: Secondary | ICD-10-CM

## 2023-01-16 DIAGNOSIS — C9211 Chronic myeloid leukemia, BCR/ABL-positive, in remission: Secondary | ICD-10-CM | POA: Diagnosis not present

## 2023-01-16 LAB — BCR-ABL1, CML/ALL, PCR, QUANT
E1A2 Transcript: <0.0032 %
Interpretation (BCRAL):: NEGATIVE
b2a2 transcript: <0.0032 %
b3a2 transcript: <0.0032 %

## 2023-01-16 NOTE — Progress Notes (Signed)
Patient here for follow up. Pt reports that she has had muscle pain.

## 2023-01-16 NOTE — Progress Notes (Signed)
Hematology/Oncology follow up note [phone]   CHIEF COMPLAINTS/PURPOSE OF VISIT:  Management of CML  ASSESSMENT & PLAN:   CML (chronic myelocytic leukemia) (HCC) *#CML, in molecular remission Labs reviewed and discussed with patient Patient is in molecular remission- MR 4 EKG was performed in the clinic and reviewed by me. Normal Qtc. Given that patient has been on Tasigna for > 8 years, Molecular remission >6 years, she could consider stopping TKI with close monitoring. She understands that 20-30% patients may develop relapse.  Off Tasigna since March 2024 Continue monitoring CML labs monthly during first year, and every 2 months for second year, every 3 months after second year.    Orders Placed This Encounter  Procedures   CBC with Differential (Cancer Center Only)    Standing Status:   Standing    Number of Occurrences:   6    Standing Expiration Date:   01/16/2024   CMP (Cancer Center only)    Standing Status:   Standing    Number of Occurrences:   6    Standing Expiration Date:   01/16/2024   BCR-ABL1, CML/ALL, PCR, QUANT    Standing Status:   Standing    Number of Occurrences:   6    Standing Expiration Date:   01/16/2024   BCR-ABL1 FISH    Standing Status:   Standing    Number of Occurrences:   6    Standing Expiration Date:   01/16/2024   Follow up in 6 months   We spent sufficient time to discuss many aspect of care, questions were answered to patient's satisfaction. A total of 25 minutes was spent on this visit.  With 5 minutes spent reviewing image findings, pathology reports, 15 minutes counseling the patient plan of stopping TKI, blood work monitoring and management of relapse if it occurs.  Additional 5 minutes was spent on answering patient's questions.   Rickard Patience, MD, PhD [facility] Hematology Oncology 01/16/2023     HISTORY OF PRESENTING ILLNESS:  Jennifer English is a  57 y.o.  female with PMH listed below who was referred to by  her oncologist in [place]. [place] this summer. She has not had local pcp yet. Extensive medical records review was performed.  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 She had dyuria symptoms at that time and lab work up found extreme leukocytosis with WBC 173,500 and she was initially started on hydrea. After CML was confirmed, she was started on Tasigna 300mg  BID since then and has achieved remission. . Also had splenomegaly at presentation about 5-6 cm below LCM, resolved. . She reports being complaint on nilotinib however, she has had period of time when her medication runs out. Although she moved this summer, due to lot of stresses associated with relocation and new job, she waited until now see hematologist.  Denies any fever, chill, weight loss, bleeding events. She used to have EKG done every 3 months and faxed to oncologist. She has not had any EKG done after July this year. No PCP Also reports recently feeling urine has odor and also very mild burning sensation when passing urine. She requests to have urine tested.  Has hypothyroidism, not taking thyroid supplements.  History of iron deficiency due to heavy menstrual periods/uterine fibroids, taking iron supplements.  # 10/09/2012, bone marrow aspirate smears and clots: Markedly hypercellular marrow with prominent myeloid hyperplasia and increasing small hypolobated megakaryocytes. The overall findings are consistent with  chronic myeloid leukemia, chronic phase. # 03/22/2015 BCR ABL quantification PCR showed e13a2 (b2a2 p210) transcript <0.001%,e14a2 ( b3a2, p210)  transcript 0.023%, e1a2 (p190) transcript <0.001% # 08/17/2015 BCR ABL quantification PCR showed e13a2 (b2a2 p210) transcript 0.011%,e14a2 ( b3a2, p210)  transcript <0.001%, e1a2 (p190) transcript <0.001%  # 04/25/2016 BCR ABL quantification PCR showed e13a2 (b2a2 p210) transcript <0.001%,e14a2 ( b3a2, p210)  transcript <0.001%, e1a2 (p190) transcript  <0.001%  #09/17/2016  BCR ABL quantification PCR showed e13a2 (b2a2 p210) transcript <0.0032%,e14a2 ( b3a2, p210)  transcript <0.0032%, e1a2 (p190) transcript <0.0032%   02/19/2017 BCR ABL quantification PCR showed b2a2 transcript <0.0032%, b3a2 transcript <0.0032%, E1A2 transcript <0.0032%  cardiology Dr.End was seen by Dr.  Sink for abnormal EKG.  In Dr. Jari Sportsman office, her EKG was normal.  Not consistent with septal infarct.  Early May 2022 patient has had COVID infection.   08/29/2020, BCR ABL 1 FISH negative, BCR ABL 1 quantification PCR positive for BCR-ABL1 e13a2 (b2a2, p210) and e14a2 (b3a2, p210), IS 2.9528. Patient was recommended to repeat testing which was done on 10/03/2020. 10/03/2020, BCR ABL 1 FISH negative, qualification PCR all negative, achieved MR again.    INTERVAL HISTORY LAJOY HELLAMS is a 57 y.o. female who has above history reviewed by me today presents for follow up of CML.  She feels well, work is busy.  No other new complaints except arthralgia.   Review of Systems  Constitutional:  Negative for appetite change, chills, fatigue and fever.  HENT:   Negative for hearing loss and voice change.   Eyes:  Negative for eye problems.  Respiratory:  Negative for chest tightness and cough.   Cardiovascular:  Negative for chest pain.  Gastrointestinal:  Negative for abdominal distention, abdominal pain and blood in stool.  Endocrine: Negative for hot flashes.  Genitourinary:  Negative for difficulty urinating and frequency.   Musculoskeletal:  Positive for arthralgias.  Skin:  Negative for itching and rash.  Neurological:  Negative for extremity weakness.  Hematological:  Negative for adenopathy.  Psychiatric/Behavioral:  Negative for confusion.    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     SURGICAL HISTORY: Past Surgical History:  Procedure Laterality Date   CESAREAN SECTION     x 1 2003    CYSTECTOMY     middle finger right hand    OTHER SURGICAL HISTORY     cyst removed from finger   TONSILECTOMY, ADENOIDECTOMY, BILATERAL MYRINGOTOMY AND TUBES     TYMPANOSTOMY TUBE PLACEMENT     1978    SOCIAL HISTORY: Social History   Socioeconomic History   Marital status: Married    Spouse name: Not on file   Number of children: Not on file   Years of education: Not on file   Highest education level: Not on file  Occupational History   Not on file  Tobacco Use   Smoking status: Never   Smokeless tobacco: Never  Vaping Use   Vaping status: Never Used  Substance and Sexual Activity   Alcohol use: No  Drug use: No   Sexual activity: Yes    Birth control/protection: None  Other Topics Concern   Not on file  Social History Narrative   Married lives with daughter husband and mother in law   1 daughter    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   [place] from [place] 2018    Social Determinants of Health   Financial Resource Strain: Not on [employer] Insecurity: Not on file  Transportation Needs: Not on file  Physical Activity: Not on file  Stress: Not on file  Social Connections: Not on file  Intimate Partner Violence: Not on file    FAMILY HISTORY: Family History  Problem Relation Age of Onset   Hyperthyroidism Mother    Heart disease Father        CAD with stents    Hyperlipidemia Father    Hypertension Father    Dementia Maternal Grandfather    Melanoma Paternal Grandmother    Bladder Cancer Paternal Grandmother    Cancer Paternal Grandmother        lung/colon ?primary    Arthritis Paternal  Grandmother    Bladder Cancer Paternal Grandfather    Cancer Paternal Grandfather        bladder died in 68s    Early death Paternal Grandfather    Alcohol abuse Maternal Grandmother    Hepatitis Maternal Grandmother    Early death Maternal Grandmother    Kidney disease Maternal Grandmother    Macular degeneration Maternal Grandmother    Liver disease Maternal Grandmother        ?alcoholic hepatitis    Hyperthyroidism Sister    Asthma Brother    Asthma Brother    Macular degeneration Maternal Aunt    Breast cancer Neg Hx     ALLERGIES:  is allergic to ibuprofen, levoxyl [levothyroxine sodium], synthroid [levothyroxine], aspirin, avocado, egg-derived products, and penicillins.  MEDICATIONS:  Current Outpatient Medications  Medication Sig Dispense Refill   ascorbic acid (VITAMIN C) 500 MG tablet Take 500 mg by mouth daily.     hydrocortisone 2.5 % cream Apply topically 2 (two) times daily. Apply to affected areas sparingly two times a day for 5 days then as needed 30 g 2   levothyroxine (SYNTHROID) 25 MCG tablet Take 1.5 tablets (37.5 mcg total) by mouth daily before breakfast. 140 tablet 3   nilotinib (TASIGNA) 150 MG capsule TAKE 2 CAPSULES (300MG ) BY  MOUTH TWICE DAILY (12 HOURS APART) ON AN EMPTY STOMACH  1 HOUR BEFORE OR 2 HOURS  AFTER A MEAL (Patient not taking: Reported on 01/16/2023) 120 capsule 5   No current facility-administered medications for this visit.     PHYSICAL EXAMINATION: ECOG PERFORMANCE STATUS: 0 - Asymptomatic Vitals:   01/16/23 1340  BP: 121/87  Pulse: 78  Resp: 18  Temp: 98.1 F (36.7 C)   Filed Weights   01/16/23 1340  Weight: 190 lb 8 oz (86.4 kg)   Physical Exam Constitutional:      General: She is not in acute distress.    Appearance: She is not diaphoretic.  HENT:     Head: Normocephalic and atraumatic.     Nose: Nose normal.     Mouth/Throat:     Pharynx: No oropharyngeal exudate.  Eyes:     General: No scleral icterus.     Pupils: Pupils are equal, round, and reactive to  light.  Cardiovascular:     Rate and Rhythm: Normal rate and regular rhythm.     Heart sounds: No murmur heard. Pulmonary:     Effort: Pulmonary effort is normal. No respiratory distress.     Breath sounds: No rales.  Chest:     Chest wall: No tenderness.  Abdominal:     General: There is no distension.     Palpations: Abdomen is soft.     Tenderness: There is no abdominal tenderness.  Musculoskeletal:        General: Normal range of motion.     Cervical back: Normal range of motion and neck supple.  Skin:    General: Skin is warm and dry.     Findings: No erythema.  Neurological:     Mental Status: She is alert and oriented to person, place, and time.     Cranial Nerves: No cranial nerve deficit.     Motor: No abnormal muscle tone.     Coordination: Coordination normal.  Psychiatric:        Mood and Affect: Affect normal.    LABORATORY DATA:  I have reviewed the data as listed    Latest Ref Rng & Units 01/09/2023   10:54 AM 11/14/2022   10:26 AM 09/12/2022   10:58 AM  CBC  WBC 4.0 - 10.5 K/uL 4.1  4.1  4.0   Hemoglobin 12.0 - 15.0 g/dL 40.9  81.1  91.4   Hematocrit 36.0 - 46.0 % 39.2  40.6  38.8   Platelets 150 - 400 K/uL 230  243  243       Latest Ref Rng & Units 07/11/2022   11:16 AM 05/03/2022   10:07 AM 01/18/2022    8:45 AM  CMP  Glucose 70 - 99 mg/dL 782  92  99   BUN 6 - 20 mg/dL 10  15  12    Creatinine 0.44 - 1.00 mg/dL 9.56  2.13  0.86   Sodium 135 - 145 mmol/L 137  134  138   Potassium 3.5 - 5.1 mmol/L 4.0  3.8  4.0   Chloride 98 - 111 mmol/L 103  100  105   CO2 22 - 32 mmol/L 28  26  26    Calcium 8.9 - 10.3 mg/dL 9.2  8.9  8.9   Total Protein 6.5 - 8.1 g/dL 8.0  8.1  7.8   Total Bilirubin 0.3 - 1.2 mg/dL 0.7  0.7  0.9   Alkaline Phos 38 - 126 U/L 72  66  57   AST 15 - 41 U/L 20  18  16    ALT 0 - 44 U/L 17  16  15

## 2023-01-16 NOTE — Assessment & Plan Note (Addendum)
*#  CML, in molecular remission Labs reviewed and discussed with patient Patient is in molecular remission- MR 4 EKG was performed in the clinic and reviewed by me. Normal Qtc. Given that patient has been on Tasigna for > 8 years, Molecular remission >6 years, she could consider stopping TKI with close monitoring. She understands that 20-30% patients may develop relapse.  Off Tasigna since March 2024 Continue monitoring CML labs monthly during first year, and every 2 months for second year, every 3 months after second year.

## 2023-02-12 ENCOUNTER — Encounter: Payer: Self-pay | Admitting: Endocrinology

## 2023-02-12 ENCOUNTER — Ambulatory Visit: Payer: BC Managed Care – PPO | Admitting: Endocrinology

## 2023-02-12 VITALS — BP 100/70 | HR 77 | Resp 20 | Ht 64.0 in | Wt 188.4 lb

## 2023-02-12 DIAGNOSIS — E063 Autoimmune thyroiditis: Secondary | ICD-10-CM | POA: Diagnosis not present

## 2023-02-12 DIAGNOSIS — E042 Nontoxic multinodular goiter: Secondary | ICD-10-CM | POA: Diagnosis not present

## 2023-02-12 DIAGNOSIS — E039 Hypothyroidism, unspecified: Secondary | ICD-10-CM

## 2023-02-12 NOTE — Progress Notes (Signed)
Outpatient Endocrinology Note Jennifer Havanah Nelms, MD  02/13/23  Patient's Name: Jennifer English    DOB: 10-Sep-1965    MRN: [id number]  REASON OF VISIT: Follow-up for hypothyroidism  PCP: Dana Allan, MD  HISTORY OF PRESENT ILLNESS:   Jennifer English is a 57 y.o. old female with past medical history as listed below is presented for a follow up for hypothyroidism due to Hashimoto's thyroiditis.  Patient was previously seen by Dr. Everardo All and was last time seen in September 2022.  Pertinent Thyroid History: Patient has primary hypothyroidism secondary to Hashimoto's thyroiditis, diagnosed in 1992, however she stated she has been on thyroid hormone replacement since 2005.  Patient historically had palpitation when taking Synthroid.  She has been on generic levothyroxine.  Per prior endocrine office note she did not tolerate 50 mcg/day or half tablet of 75 mcg/day had palpitations, she had ? rash with Synthroid 25 mcg/day.    - In the past she mostly had elevated TSH, under replacement of levothyroxine.   Patient has history of multiple thyroid nodules.  Reportedly she had ultrasound thyroid showing thyroid nodule when she was in [place] in 2007, she never had biopsy of thyroid nodule. -She had ultrasound thyroid in April 2021 heterogeneous and multinodular nodular large thyroid consistent with chronic autoimmune thyroid disorder/Hashimoto's thyroiditis with isthmus nodule of 1.9 cm, left inferior nodule of 1.8 cm.   Interval history Patient is currently taking levothyroxine 25 mcg daily.  She has dry hand.  She denies palpitation or heat intolerance.  She reports overall normal energy level.  Denies cold intolerance.  No change in bowel habit or constipation.  No neck discomfort, dysphagia.  No change in thyroid nodule.  No other complaints today.  She was seen in this clinic in September 2022 by Dr. Everardo All.  REVIEW OF SYSTEMS:  As per history of present illness.   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     PAST SURGICAL HISTORY: Past Surgical History:  Procedure Laterality Date   CESAREAN SECTION     x 1 2003    CYSTECTOMY     middle finger right hand    OTHER SURGICAL HISTORY     cyst removed from finger   TONSILECTOMY, ADENOIDECTOMY, BILATERAL MYRINGOTOMY AND TUBES     TYMPANOSTOMY TUBE PLACEMENT     1978    ALLERGIES: Allergies  Allergen Reactions   Ibuprofen    Levoxyl [Levothyroxine Sodium]     Rash     Synthroid [Levothyroxine]     rash   Aspirin Rash   Avocado Rash   Egg-Derived Products Rash   Penicillins Rash    FAMILY HISTORY:  Family History  Problem Relation Age of Onset   Hyperthyroidism Mother    Heart disease Father        CAD with stents    Hyperlipidemia Father    Hypertension Father    Dementia Maternal Grandfather    Melanoma Paternal Grandmother    Bladder Cancer Paternal Grandmother    Cancer Paternal Grandmother  lung/colon ?primary    Arthritis Paternal Grandmother    Bladder Cancer Paternal Grandfather    Cancer Paternal Grandfather        bladder died in 27s    Early death Paternal Grandfather    Alcohol abuse Maternal Grandmother    Hepatitis Maternal Grandmother    Early death Maternal Grandmother    Kidney disease Maternal Grandmother    Macular degeneration Maternal Grandmother    Liver disease Maternal Grandmother        ?alcoholic hepatitis    Hyperthyroidism Sister    Asthma Brother    Asthma Brother    Macular degeneration Maternal Aunt    Breast cancer Neg Hx     SOCIAL HISTORY: Social History   Socioeconomic History   Marital status: Married    Spouse  name: Not on file   Number of children: Not on file   Years of education: Not on file   Highest education level: Not on file  Occupational History   Not on file  Tobacco Use   Smoking status: Never   Smokeless tobacco: Never  Vaping Use   Vaping status: Never Used  Substance and Sexual Activity   Alcohol use: No   Drug use: No   Sexual activity: Yes    Birth control/protection: None  Other Topics Concern   Not on file  Social History Narrative   Married lives with daughter husband and mother in law   1 daughter    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     hydrocortisone 2.5 % cream Apply topically 2 (two) times daily. Apply to affected areas sparingly two times a day for 5 days then as needed 30 g 2   nilotinib (TASIGNA) 150 MG capsule TAKE 2 CAPSULES (300MG ) BY  MOUTH TWICE DAILY (12 HOURS APART) ON AN EMPTY STOMACH  1 HOUR BEFORE OR 2 HOURS  AFTER A MEAL 120 capsule 5   levothyroxine (SYNTHROID) 25 MCG tablet Take 1 tablet daily for 6 days and 2 tablet on seventh day, total 8 tablets in a week. 94 tablet 3   No current facility-administered medications for this visit.    PHYSICAL EXAM: Vitals:   02/12/23 1557  BP: 100/70  Pulse: 77  Resp: 20  SpO2: 98%  Weight: 188 lb 6.4 oz (85.5 kg)  Height: 5\' 4"  (1.626 m)   Body mass index is 32.34 kg/m.  Wt Readings from Last 3 Encounters:  02/12/23 188 lb 6.4 oz (85.5 kg)  01/16/23 190 lb 8 oz (86.4 kg)  07/25/22  188 lb 6.4 oz (85.5 kg)    General: Well developed, well nourished female in no apparent distress.  HEENT: AT/[place], no external lesions. Hearing intact to the spoken word Eyes: Conjunctiva clear and no icterus. Neck: Trachea midline, neck supple with appreciable mild thyromegaly or no lymphadenopathy and left palpable thyroid nodules + Lungs: Clear to auscultation, no wheeze. Respirations not  labored Heart: S1S2, Regular in rate and rhythm. Abdomen: Soft, non tender Neurologic: Alert, oriented, normal speech, deep tendon biceps reflexes normal,  no gross focal neurological deficit Extremities: No pedal pitting edema, no tremors of outstretched hands Skin: Warm, color good. Dry skin.  Psychiatric: Does not appear depressed or anxious  PERTINENT HISTORIC LABORATORY AND IMAGING STUDIES:  All pertinent laboratory results were reviewed. Please see HPI also for further details.   TSH  Date Value Ref Range Status  02/12/2023 5.98 (H) 0.35 - 5.50 uIU/mL Final  07/12/2021 6.78 (H) 0.35 - 5.50 uIU/mL Final  12/21/2020 15.78 (H) 0.35 - 5.50 uIU/mL Final     ASSESSMENT / PLAN  1. Hypothyroidism due to Hashimoto's thyroiditis   2. Multiple thyroid nodules   3. Hypothyroidism, unspecified type    - Patient has longstanding history of primary hypothyroidism secondary to Hashimoto's thyroiditis.  She has been on relatively low-dose of thyroid hormone replacement.  Historically based on prior office record she seems to have reaction with higher dose of levothyroxine mainly the palpitations in the past. -She would like to not to take Synthroid.  She is on generic levothyroxine. -She is currently taking levothyroxine 25 mcg daily.  She is overall clinically euthyroid except dry hands. -Check ultrasound thyroid in March 2021, heterogeneous thyroid parenchyma mostly consistent with having Hashimoto thyroiditis and has isthmus and left inferior thyroid nodule.  Plan: -Check thyroid function test and  adjust the dose of levothyroxine as needed. -Check ultrasound thyroid to monitor thyroid nodules.   We discussed the medical need for compliance with levothyroxine therapy, that it is a hormone necessary for life, and that serious consequences may result from noncompliance. Discussed the proper method of levothyroxine administration: take on an empty stomach in the morning, with water, waiting thirty to sixty minutes before taking any other beverages or food. Also reviewed the need to take calcium or iron supplements or multivitamin (that may contain iron or calcium) at least 4 hours after levothyroxine administration.  Diagnoses and all orders for this visit:  Hypothyroidism due to Hashimoto's thyroiditis -     T4, free; Future -     TSH; Future -     TSH -     T4, free -     T4, free -     TSH  Multiple thyroid nodules -     US THYROID; Future  Hypothyroidism, unspecified type -     levothyroxine (SYNTHROID) 25 MCG tablet; Take 1 tablet daily for 6 days and 2 tablet on seventh day, total 8 tablets in a week.   Labs reviewed TSH is mildly elevated.  Normal free T4.  She is currently taking levothyroxine 25 mcg daily.  I would like to increase 1 additional tablet in a week.  Check TSH, free T4 in follow-up visit in 3 months.   Latest Reference Range & Units 02/12/23 16:27  TSH 0.35 - 5.50 uIU/mL 5.98 (H)  T4,Free(Direct) 0.60 - 1.60 ng/dL 1.47  (H): Data is abnormally high  DISPOSITION Follow up in clinic in 3 months suggested.  All questions answered and patient verbalized understanding of the plan.  Jennifer Sevyn Paredez, MD [facility] Endocrinology [facility] [address] [place], [address] [place], [place] [address] Phone # [phone]  At least part of this note was generated using voice recognition software. Inadvertent word errors may have occurred, which were not recognized during the proofreading process.

## 2023-02-12 NOTE — Patient Instructions (Signed)
Plan for US thyroid.   Thyroid lab today.

## 2023-02-13 ENCOUNTER — Inpatient Hospital Stay: Payer: BC Managed Care – PPO | Attending: Oncology

## 2023-02-13 ENCOUNTER — Inpatient Hospital Stay: Payer: BC Managed Care – PPO

## 2023-02-13 DIAGNOSIS — C9211 Chronic myeloid leukemia, BCR/ABL-positive, in remission: Secondary | ICD-10-CM | POA: Diagnosis not present

## 2023-02-13 DIAGNOSIS — C9210 Chronic myeloid leukemia, BCR/ABL-positive, not having achieved remission: Secondary | ICD-10-CM

## 2023-02-13 LAB — T4, FREE: Free T4: 0.87 ng/dL (ref 0.60–1.60)

## 2023-02-13 LAB — TSH: TSH: 5.98 u[IU]/mL — ABNORMAL HIGH (ref 0.35–5.50)

## 2023-02-13 MED ORDER — LEVOTHYROXINE SODIUM 25 MCG PO TABS: ORAL_TABLET | ORAL | 3 refills | Status: AC

## 2023-02-13 NOTE — Addendum Note (Signed)
Addended by: Esmond Hinch, [place] on: 02/13/2023 01:44 PM   Modules accepted: Orders, Level of Service

## 2023-02-18 ENCOUNTER — Telehealth: Payer: Self-pay

## 2023-02-18 NOTE — Telephone Encounter (Signed)
-----   Message from [place] Th[place] sent at 02/13/2023  1:45 PM EST ----- Labs reviewed mildly elevated TSH.  She is currently taking levothyroxine 25 mcg daily.  I would like to increase to 1 extra tablet in a week. Take 1 tablet daily for 6 days and 2 tablets on seventh day with total of 8 tablets in a week.  Check thyroid lab prior to follow-up visit in 3 months.  Order placed.  Please inform the patient.

## 2023-02-18 NOTE — Telephone Encounter (Signed)
Patient given results and medication adjustments as directed by MD. Patient to call back to schedule labs, office number given.

## 2023-02-19 LAB — BCR-ABL1 FISH
Cells Analyzed: 200
Cells Counted: 200

## 2023-02-19 LAB — BCR-ABL1, CML/ALL, PCR, QUANT
E1A2 Transcript: <0.0032 %
Interpretation (BCRAL):: NEGATIVE
b2a2 transcript: <0.0032 %
b3a2 transcript: <0.0032 %

## 2023-03-13 ENCOUNTER — Inpatient Hospital Stay: Payer: BC Managed Care – PPO | Attending: Oncology

## 2023-03-13 DIAGNOSIS — C9210 Chronic myeloid leukemia, BCR/ABL-positive, not having achieved remission: Secondary | ICD-10-CM | POA: Diagnosis not present

## 2023-03-13 DIAGNOSIS — C9211 Chronic myeloid leukemia, BCR/ABL-positive, in remission: Secondary | ICD-10-CM | POA: Diagnosis not present

## 2023-03-18 LAB — BCR-ABL1, CML/ALL, PCR, QUANT
E1A2 Transcript: <0.0032 %
Interpretation (BCRAL):: NEGATIVE
b2a2 transcript: <0.0032 %
b3a2 transcript: <0.0032 %

## 2023-03-21 LAB — BCR-ABL1 FISH
Cells Analyzed: 200
Cells Counted: 200

## 2023-04-17 ENCOUNTER — Inpatient Hospital Stay: Payer: BC Managed Care – PPO | Attending: Oncology

## 2023-04-17 DIAGNOSIS — C9210 Chronic myeloid leukemia, BCR/ABL-positive, not having achieved remission: Secondary | ICD-10-CM

## 2023-04-17 DIAGNOSIS — C9211 Chronic myeloid leukemia, BCR/ABL-positive, in remission: Secondary | ICD-10-CM | POA: Diagnosis not present

## 2023-04-23 LAB — BCR-ABL1 FISH
Cells Analyzed: 200
Cells Counted: 200

## 2023-04-24 LAB — BCR-ABL1, CML/ALL, PCR, QUANT
E1A2 Transcript: <0.0032 %
Interpretation (BCRAL):: NEGATIVE
b2a2 transcript: <0.0032 %
b3a2 transcript: <0.0032 %

## 2023-05-07 ENCOUNTER — Telehealth: Payer: Self-pay

## 2023-05-07 DIAGNOSIS — E063 Autoimmune thyroiditis: Secondary | ICD-10-CM

## 2023-05-07 NOTE — Telephone Encounter (Signed)
Orders Placed This Encounter  Procedures   TSH   T4, free

## 2023-05-08 ENCOUNTER — Other Ambulatory Visit: Payer: BC Managed Care – PPO

## 2023-05-15 ENCOUNTER — Ambulatory Visit: Payer: BC Managed Care – PPO | Admitting: Endocrinology

## 2023-05-22 ENCOUNTER — Inpatient Hospital Stay: Payer: BC Managed Care – PPO | Attending: Oncology

## 2023-06-19 ENCOUNTER — Inpatient Hospital Stay: Payer: BC Managed Care – PPO | Attending: Oncology

## 2023-07-15 ENCOUNTER — Encounter: Payer: Self-pay | Admitting: Oncology

## 2023-07-16 ENCOUNTER — Other Ambulatory Visit: Payer: Self-pay

## 2023-07-17 ENCOUNTER — Inpatient Hospital Stay: Payer: BC Managed Care – PPO | Attending: Oncology

## 2023-07-17 DIAGNOSIS — C9211 Chronic myeloid leukemia, BCR/ABL-positive, in remission: Secondary | ICD-10-CM | POA: Insufficient documentation

## 2023-07-17 DIAGNOSIS — C9210 Chronic myeloid leukemia, BCR/ABL-positive, not having achieved remission: Secondary | ICD-10-CM

## 2023-07-17 LAB — CBC WITH DIFFERENTIAL/PLATELET
Abs Immature Granulocytes: 0.01 10*3/uL (ref 0.00–0.07)
Basophils Absolute: 0 10*3/uL (ref 0.0–0.1)
Basophils Relative: 1 %
Eosinophils Absolute: 0.2 10*3/uL (ref 0.0–0.5)
Eosinophils Relative: 6 %
HCT: 37.7 % (ref 36.0–46.0)
Hemoglobin: 12.8 g/dL (ref 12.0–15.0)
Immature Granulocytes: 0 %
Lymphocytes Relative: 37 %
Lymphs Abs: 1.5 10*3/uL (ref 0.7–4.0)
MCH: 31.1 pg (ref 26.0–34.0)
MCHC: 34 g/dL (ref 30.0–36.0)
MCV: 91.7 fL (ref 80.0–100.0)
Monocytes Absolute: 0.3 10*3/uL (ref 0.1–1.0)
Monocytes Relative: 7 %
Neutro Abs: 2 10*3/uL (ref 1.7–7.7)
Neutrophils Relative %: 49 %
Platelets: 232 10*3/uL (ref 150–400)
RBC: 4.11 MIL/uL (ref 3.87–5.11)
RDW: 11.6 % (ref 11.5–15.5)
WBC: 4 10*3/uL (ref 4.0–10.5)
nRBC: 0 % (ref 0.0–0.2)

## 2023-07-17 LAB — COMPREHENSIVE METABOLIC PANEL WITH GFR
ALT: 26 U/L (ref 0–44)
AST: 23 U/L (ref 15–41)
Albumin: 3.9 g/dL (ref 3.5–5.0)
Alkaline Phosphatase: 90 U/L (ref 38–126)
Anion gap: 7 (ref 5–15)
BUN: 12 mg/dL (ref 6–20)
CO2: 27 mmol/L (ref 22–32)
Calcium: 8.8 mg/dL — ABNORMAL LOW (ref 8.9–10.3)
Chloride: 101 mmol/L (ref 98–111)
Creatinine, Ser: 0.61 mg/dL (ref 0.44–1.00)
GFR, Estimated: >60 mL/min (ref >60)
Glucose, Bld: 97 mg/dL (ref 70–99)
Potassium: 3.9 mmol/L (ref 3.5–5.1)
Sodium: 135 mmol/L (ref 135–145)
Total Bilirubin: 0.7 mg/dL (ref 0.0–1.2)
Total Protein: 7.7 g/dL (ref 6.5–8.1)

## 2023-07-22 LAB — BCR-ABL1 FISH
Cells Analyzed: 200
Cells Counted: 200

## 2023-07-23 LAB — BCR-ABL1, CML/ALL, PCR, QUANT
E1A2 Transcript: <0.0032 %
Interpretation (BCRAL):: NEGATIVE
b2a2 transcript: <0.0032 %
b3a2 transcript: <0.0032 %

## 2023-07-31 ENCOUNTER — Encounter: Payer: Self-pay | Admitting: Oncology

## 2023-07-31 ENCOUNTER — Inpatient Hospital Stay: Payer: BC Managed Care – PPO | Attending: Oncology | Admitting: Oncology

## 2023-07-31 VITALS — BP 117/91 | HR 79 | Temp 97.8°F | Resp 16 | Wt 191.0 lb

## 2023-07-31 DIAGNOSIS — C9210 Chronic myeloid leukemia, BCR/ABL-positive, not having achieved remission: Secondary | ICD-10-CM

## 2023-07-31 DIAGNOSIS — C9211 Chronic myeloid leukemia, BCR/ABL-positive, in remission: Secondary | ICD-10-CM | POA: Diagnosis not present

## 2023-07-31 NOTE — Assessment & Plan Note (Addendum)
*#  CML, in molecular remission Labs reviewed and discussed with patient Patient is in molecular remission- MR 4 EKG was performed in the clinic and reviewed by me. Normal Qtc. Given that patient has been on Tasigna  for > 8 years, Molecular remission >6 years, she could consider stopping TKI with close monitoring. She understands that 20-30% patients may develop relapse.  Off Tasigna  since March 2024 Continue monitoring CML labs, increase interval to every 2 months for second year, every 3 months after second year.

## 2023-07-31 NOTE — Progress Notes (Signed)
 Hematology/Oncology follow up note [phone]   CHIEF COMPLAINTS/PURPOSE OF VISIT:  Management of CML  ASSESSMENT & PLAN:   CML (chronic myelocytic leukemia) (HCC) *#CML, in molecular remission Labs reviewed and discussed with patient Patient is in molecular remission- MR 4 EKG was performed in the clinic and reviewed by me. Normal Qtc. Given that patient has been on Tasigna  for > 8 years, Molecular remission >6 years, Jennifer English could consider stopping TKI with close monitoring. Jennifer English understands that 20-30% patients may develop relapse.  Off Tasigna  since March 2024 Continue monitoring CML labs, increase interval to every 2 months for second year, every 3 months after second year.    Hypocalcemia Recommend calcium and vitamin D  supplementation.    Orders Placed This Encounter  Procedures   CBC with Differential (Cancer Center Only)    Standing Status:   Future    Expected Date:   01/31/2024    Expiration Date:   07/30/2024   CMP (Cancer Center only)    Standing Status:   Future    Expected Date:   01/31/2024    Expiration Date:   07/30/2024   BCR-ABL1 FISH    Standing Status:   Future    Expected Date:   01/31/2024    Expiration Date:   07/30/2024   BCR-ABL1, CML/ALL, PCR, QUANT    Standing Status:   Future    Expected Date:   01/31/2024    Expiration Date:   07/30/2024   BCR-ABL1, CML/ALL, PCR, QUANT    Standing Status:   Future    Expected Date:   12/01/2023    Expiration Date:   07/30/2024   BCR-ABL1 FISH    Standing Status:   Future    Expected Date:   12/01/2023    Expiration Date:   07/30/2024   BCR-ABL1, CML/ALL, PCR, QUANT    Standing Status:   Future    Expected Date:   09/30/2023    Expiration Date:   07/30/2024   BCR-ABL1 FISH    Standing Status:   Future    Expected Date:   09/30/2023    Expiration Date:   07/30/2024   Follow up in 6 months   We spent sufficient time to discuss many aspect of care, questions were answered to patient's satisfaction.  Jennifer Forbes, MD,  PhD [facility] Hematology Oncology 07/31/2023     HISTORY OF PRESENTING ILLNESS:  Jennifer English is a  58 y.o.  female with PMH listed below who was referred to by her oncologist in [place] . [place]  to [place] this summer. Jennifer English has not had local pcp yet. Extensive medical records review was performed.  Jennifer English was diagnosed with CML in July 2014. Jennifer English had dyuria symptoms at that time and lab work up found extreme leukocytosis with WBC 173,500 and Jennifer English was initially started on hydrea. After CML was confirmed, Jennifer English was started on Tasigna  300mg  BID since then and has achieved remission. . Also had splenomegaly at presentation about 5-6 cm below LCM, resolved. . Jennifer English reports being complaint on nilotinib  however, Jennifer English has had period of time when her medication runs out. Although Jennifer English moved this summer, due to lot of stresses associated with relocation and new job, Jennifer English waited until now see hematologist.  Denies any fever, chill, weight loss, bleeding events. Jennifer English used to have EKG done every 3 months and faxed to oncologist. Jennifer English has not had any EKG done after July this year. No PCP Also reports recently feeling  urine has odor and also very mild burning sensation when passing urine. Jennifer English requests to have urine tested.  Has hypothyroidism, not taking thyroid  supplements.  History of iron deficiency due to heavy menstrual periods/uterine fibroids, taking iron supplements.  # 10/09/2012, bone marrow aspirate smears and clots: Markedly hypercellular marrow with prominent myeloid hyperplasia and increasing small hypolobated megakaryocytes. The overall findings are consistent with chronic myeloid leukemia, chronic phase. # 03/22/2015 BCR ABL quantification PCR showed e13a2 (b2a2 p210) transcript <0.001%,e14a2 ( b3a2, p210)  transcript 0.023%, e1a2 (p190) transcript <0.001% # 08/17/2015 BCR ABL quantification PCR showed e13a2 (b2a2 p210) transcript 0.011%,e14a2 ( b3a2, p210)  transcript <0.001%, e1a2  (p190) transcript <0.001%  # 04/25/2016 BCR ABL quantification PCR showed e13a2 (b2a2 p210) transcript <0.001%,e14a2 ( b3a2, p210)  transcript <0.001%, e1a2 (p190) transcript <0.001%  #09/17/2016  BCR ABL quantification PCR showed e13a2 (b2a2 p210) transcript <0.0032%,e14a2 ( b3a2, p210)  transcript <0.0032%, e1a2 (p190) transcript <0.0032%   02/19/2017 BCR ABL quantification PCR showed b2a2 transcript <0.0032%, b3a2 transcript <0.0032%, E1A2 transcript <0.0032%  cardiology Dr.End was seen by Dr. Franky Ivanoff for abnormal EKG.  In Dr. Levin Reamer office, her EKG was normal.  Not consistent with septal infarct.  Early May 2022 patient has had COVID infection.   08/29/2020, BCR ABL 1 FISH negative, BCR ABL 1 quantification PCR positive for BCR-ABL1 e13a2 (b2a2, p210) and e14a2 (b3a2, p210), IS 2.4401. Patient was recommended to repeat testing which was done on 10/03/2020. 10/03/2020, BCR ABL 1 FISH negative, qualification PCR all negative, achieved MR again.    INTERVAL HISTORY Jennifer English is a 58 y.o. female who has above history reviewed by me today presents for follow up of CML.  Jennifer English feels well. Recent rotavirus infection. Symptoms are all resolved.  No other new complaints except arthralgia.   Review of Systems  Constitutional:  Negative for appetite change, chills, fatigue and fever.  HENT:   Negative for hearing loss and voice change.   Eyes:  Negative for eye problems.  Respiratory:  Negative for chest tightness and cough.   Cardiovascular:  Negative for chest pain.  Gastrointestinal:  Negative for abdominal distention, abdominal pain and blood in stool.  Endocrine: Negative for hot flashes.  Genitourinary:  Negative for difficulty urinating and frequency.   Musculoskeletal:  Positive for arthralgias.  Skin:  Negative for itching and rash.  Neurological:  Negative for extremity weakness.  Hematological:  Negative for adenopathy.  Psychiatric/Behavioral:  Negative for confusion.    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     SURGICAL HISTORY: Past Surgical History:  Procedure Laterality Date   CESAREAN SECTION     x 1 2003    CYSTECTOMY     middle finger right hand    OTHER SURGICAL HISTORY     cyst removed from finger   TONSILECTOMY, ADENOIDECTOMY, BILATERAL MYRINGOTOMY AND TUBES     TYMPANOSTOMY TUBE PLACEMENT     1978    SOCIAL HISTORY: Social History   Socioeconomic History   Marital status: Married  Spouse name: Not on file   Number of children: Not on file   Years of education: Not on file   Highest education level: Not on file  Occupational History   Not on file  Tobacco Use   Smoking status: Never   Smokeless tobacco: Never  Vaping Use   Vaping status: Never Used  Substance and Sexual Activity   Alcohol use: No   Drug use: No   Sexual activity: Yes    Birth control/protection: None  Other Topics Concern   Not on file  Social History Narrative   Married lives with daughter husband and mother in law   1 daughter    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   [place] from [place] 2018    Social Drivers of Corporate investment banker Strain: Not on file  Food Insecurity: Not on file  Transportation Needs: Not on file  Physical Activity: Not on file  Stress: Not on file  Social Connections: Not on file  Intimate Partner Violence: Not on file    FAMILY HISTORY: Family History  Problem Relation Age of Onset   Hyperthyroidism Mother    Heart disease Father        CAD with stents    Hyperlipidemia Father     Hypertension Father    Dementia Maternal Grandfather    Melanoma Paternal Grandmother    Bladder Cancer Paternal Grandmother    Cancer Paternal Grandmother        lung/colon ?primary    Arthritis Paternal Grandmother    Bladder Cancer Paternal Grandfather    Cancer Paternal Grandfather        bladder died in 17s    Early death Paternal Grandfather    Alcohol abuse Maternal Grandmother    Hepatitis Maternal Grandmother    Early death Maternal Grandmother    Kidney disease Maternal Grandmother    Macular degeneration Maternal Grandmother    Liver disease Maternal Grandmother        ?alcoholic hepatitis    Hyperthyroidism Sister    Asthma Brother    Asthma Brother    Macular degeneration Maternal Aunt    Breast cancer Neg Hx     ALLERGIES:  is allergic to ibuprofen, levoxyl  [levothyroxine  sodium], synthroid  [levothyroxine ], aspirin, avocado, egg-derived products, and penicillins.  MEDICATIONS:  Current Outpatient Medications  Medication Sig Dispense Refill   ascorbic acid (VITAMIN C) 500 MG tablet Take 500 mg by mouth daily.     hydrocortisone  2.5 % cream Apply topically 2 (two) times daily. Apply to affected areas sparingly two times a day for 5 days then as needed 30 g 2   levothyroxine  (SYNTHROID ) 25 MCG tablet Take 1 tablet daily for 6 days and 2 tablet on seventh day, total 8 tablets in a week. 94 tablet 3   nilotinib  (TASIGNA ) 150 MG capsule TAKE 2 CAPSULES (300MG ) BY  MOUTH TWICE DAILY (12 HOURS APART) ON AN EMPTY STOMACH  1 HOUR BEFORE OR 2 HOURS  AFTER A MEAL (Patient not taking: Reported on 07/31/2023) 120 capsule 5   No current facility-administered medications for this visit.     PHYSICAL EXAMINATION: ECOG PERFORMANCE STATUS: 0 - Asymptomatic Vitals:   07/31/23 1056  BP: (!) 117/91  Pulse: 79  Resp: 16  Temp: 97.8 F (36.6 C)  SpO2: 99%   Filed Weights   07/31/23 1056  Weight:  191 lb (86.6 kg)   Physical Exam Constitutional:      General: Jennifer English is not in  acute distress.    Appearance: Jennifer English is not diaphoretic.  HENT:     Head: Normocephalic and atraumatic.     Nose: Nose normal.     Mouth/Throat:     Pharynx: No oropharyngeal exudate.  Eyes:     General: No scleral icterus.    Pupils: Pupils are equal, round, and reactive to light.  Cardiovascular:     Rate and Rhythm: Normal rate and regular rhythm.     Heart sounds: No murmur heard. Pulmonary:     Effort: Pulmonary effort is normal. No respiratory distress.     Breath sounds: No rales.  Chest:     Chest wall: No tenderness.  Abdominal:     General: There is no distension.     Palpations: Abdomen is soft.     Tenderness: There is no abdominal tenderness.  Musculoskeletal:        General: Normal range of motion.     Cervical back: Normal range of motion and neck supple.  Skin:    General: Skin is warm and dry.     Findings: No erythema.  Neurological:     Mental Status: Jennifer English is alert and oriented to person, place, and time.     Cranial Nerves: No cranial nerve deficit.     Motor: No abnormal muscle tone.     Coordination: Coordination normal.  Psychiatric:        Mood and Affect: Affect normal.    LABORATORY DATA:  I have reviewed the data as listed    Latest Ref Rng & Units 07/17/2023   11:33 AM 01/09/2023   10:54 AM 11/14/2022   10:26 AM  CBC  WBC 4.0 - 10.5 K/uL 4.0  4.1  4.1   Hemoglobin 12.0 - 15.0 g/dL 16.1  09.6  04.5   Hematocrit 36.0 - 46.0 % 37.7  39.2  40.6   Platelets 150 - 400 K/uL 232  230  243       Latest Ref Rng & Units 07/17/2023   11:33 AM 07/11/2022   11:16 AM 05/03/2022   10:07 AM  CMP  Glucose 70 - 99 mg/dL 97  409  92   BUN 6 - 20 mg/dL 12  10  15    Creatinine 0.44 - 1.00 mg/dL 8.11  9.14  7.82   Sodium 135 - 145 mmol/L 135  137  134   Potassium 3.5 - 5.1 mmol/L 3.9  4.0  3.8   Chloride 98 - 111 mmol/L 101  103  100   CO2 22 - 32 mmol/L 27  28  26    Calcium 8.9 - 10.3 mg/dL 8.8  9.2  8.9   Total Protein 6.5 - 8.1 g/dL 7.7  8.0  8.1    Total Bilirubin 0.0 - 1.2 mg/dL 0.7  0.7  0.7   Alkaline Phos 38 - 126 U/L 90  72  66   AST 15 - 41 U/L 23  20  18    ALT 0 - 44 U/L 26  17  16

## 2023-07-31 NOTE — Assessment & Plan Note (Signed)
 Recommend calcium and vitamin D supplementation.

## 2023-09-19 ENCOUNTER — Ambulatory Visit: Admitting: Podiatry

## 2023-10-02 ENCOUNTER — Inpatient Hospital Stay: Attending: Oncology

## 2023-10-02 DIAGNOSIS — C9210 Chronic myeloid leukemia, BCR/ABL-positive, not having achieved remission: Secondary | ICD-10-CM

## 2023-10-02 DIAGNOSIS — C9211 Chronic myeloid leukemia, BCR/ABL-positive, in remission: Secondary | ICD-10-CM | POA: Diagnosis not present

## 2023-10-07 LAB — BCR-ABL1, CML/ALL, PCR, QUANT
E1A2 Transcript: <0.0032 %
Interpretation (BCRAL):: NEGATIVE
b2a2 transcript: <0.0032 %
b3a2 transcript: <0.0032 %

## 2023-10-08 LAB — BCR-ABL1 FISH
Cells Analyzed: 200
Cells Counted: 200

## 2023-10-14 ENCOUNTER — Ambulatory Visit: Admitting: Podiatry

## 2023-10-31 ENCOUNTER — Encounter: Payer: Self-pay | Admitting: Podiatry

## 2023-10-31 ENCOUNTER — Ambulatory Visit: Admitting: Podiatry

## 2023-10-31 VITALS — Ht 64.0 in | Wt 191.0 lb

## 2023-10-31 DIAGNOSIS — M19071 Primary osteoarthritis, right ankle and foot: Secondary | ICD-10-CM | POA: Diagnosis not present

## 2023-10-31 DIAGNOSIS — M722 Plantar fascial fibromatosis: Secondary | ICD-10-CM

## 2023-10-31 DIAGNOSIS — M19072 Primary osteoarthritis, left ankle and foot: Secondary | ICD-10-CM | POA: Diagnosis not present

## 2023-10-31 NOTE — Progress Notes (Signed)
 Chief Complaint  Patient presents with   Foot Pain    Pt is here due to bilateral foot pain states her feet always hurt, she was seen her a year ago for the same issue was told that she has arthritis in both feet, couldn't f/u due to personal issues, wants to know the next steps.    HPI: 58 y.o. 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  Dermatology: Skin is warm, dry and supple bilateral lower extremities.   Vascular: Palpable pedal pulses bilaterally. Capillary refill within normal limits.  No appreciable edema.  No erythema.  Neurological: Grossly intact via light touch  Musculoskeletal Exam: Tenderness palpation noted throughout the midtarsal joint bilateral feet  Radiographic Exam B/L feet 10/01/2022:  Normal osseous mineralization. Joint spaces preserved.  No fractures or osseous irregularities noted.  There is a small plantar calcaneal spurring noted especially on medial oblique view of the left foot.   Impression: Small plantar calcaneal heel spur left  Assessment/Plan of Care: 1.  Generalized diffuse foot pain in combination with midtarsal arthritis bilateral feet  -Patient evaluated.  -Today we had a very lengthy conversation regarding conservative care for her midfoot arthritis.  Discussed conservative management including orthotics, shoe gear, anti-inflammatories. -Prescription for meloxicam 15 mg daily PRN.  She does have hives with ibuprofen.  She will let me know if meloxicam causes any allergic reaction and we can try a different NSAID such as diclofenac -Today the patient was seen by our Pedorthist, Lolita, and molded for custom orthotics.  Recommend custom orthotics to support the medial longitudinal arch of the foot and alleviate potentially her midfoot arthritis as well as generalized diffuse foot pain -Recommend cortisone injections PRN.  Currently her feet are tolerable and declined injection today -Return to clinic PRN  Academic librarian for [employer]      Thresa EMERSON Sar, DPM [facility]  Dr. Thresa EMERSON Sar, DPM    [address]  [place], [place] [address]                Office [phone]  Fax [phone]

## 2023-11-20 ENCOUNTER — Telehealth: Payer: Self-pay

## 2023-11-20 NOTE — Telephone Encounter (Signed)
 LVM to schedule orthotic fitting/ pu  Orthotics in [place] bin

## 2023-12-04 ENCOUNTER — Inpatient Hospital Stay: Attending: Oncology

## 2023-12-04 ENCOUNTER — Ambulatory Visit (INDEPENDENT_AMBULATORY_CARE_PROVIDER_SITE_OTHER): Admitting: Podiatry

## 2023-12-04 DIAGNOSIS — C9210 Chronic myeloid leukemia, BCR/ABL-positive, not having achieved remission: Secondary | ICD-10-CM

## 2023-12-04 DIAGNOSIS — C9211 Chronic myeloid leukemia, BCR/ABL-positive, in remission: Secondary | ICD-10-CM | POA: Insufficient documentation

## 2023-12-04 DIAGNOSIS — Q666 Other congenital valgus deformities of feet: Secondary | ICD-10-CM | POA: Diagnosis not present

## 2023-12-04 DIAGNOSIS — Z79899 Other long term (current) drug therapy: Secondary | ICD-10-CM | POA: Diagnosis not present

## 2023-12-04 NOTE — Progress Notes (Signed)
 Patient is orthotics are functioning well break-in period was discussed.  No complication noted.

## 2023-12-09 LAB — BCR-ABL1, CML/ALL, PCR, QUANT
E1A2 Transcript: <0.0032 %
Interpretation (BCRAL):: NEGATIVE
b2a2 transcript: <0.0032 %
b3a2 transcript: <0.0032 %

## 2023-12-15 LAB — BCR-ABL1 FISH
Cells Analyzed: 200
Cells Counted: 200

## 2024-01-29 ENCOUNTER — Inpatient Hospital Stay: Attending: Oncology

## 2024-02-03 ENCOUNTER — Telehealth: Payer: Self-pay | Admitting: Oncology

## 2024-02-03 NOTE — Telephone Encounter (Signed)
 Pt no showed labs prior 11/6. Pt currently scheduled for MD appt on 11/20. Pt will need labs prior to visit.  I left a vm for pt to call back to either r/s labs prior to MD, r/s all appts or cancel MD appt. Scheduling phone number provided.

## 2024-02-05 ENCOUNTER — Telehealth: Payer: Self-pay | Admitting: Oncology

## 2024-02-05 NOTE — Telephone Encounter (Signed)
 Left another vm for pt that due to not getting labs prior we have now canceled MD appt on 11/20. If pt would like to r.s appts to call us  back. Scheduling phone number provided.

## 2024-02-12 ENCOUNTER — Ambulatory Visit: Admitting: Oncology

## 2024-05-04 ENCOUNTER — Inpatient Hospital Stay

## 2024-05-25 ENCOUNTER — Inpatient Hospital Stay: Admitting: Oncology
